# Patient Record
Sex: Female | Born: 1955 | Race: White | Hispanic: No | Marital: Married | Smoking: Former smoker
Health system: Southern US, Community
[De-identification: ages and names within clinical notes are randomized; demographics above are authoritative.]

## PROBLEM LIST (undated history)

## (undated) DIAGNOSIS — C539 Malignant neoplasm of cervix uteri, unspecified: Secondary | ICD-10-CM

## (undated) DIAGNOSIS — C8190 Hodgkin lymphoma, unspecified, unspecified site: Secondary | ICD-10-CM

## (undated) DIAGNOSIS — IMO0002 Reserved for concepts with insufficient information to code with codable children: Secondary | ICD-10-CM

## (undated) DIAGNOSIS — R011 Cardiac murmur, unspecified: Secondary | ICD-10-CM

## (undated) DIAGNOSIS — E119 Type 2 diabetes mellitus without complications: Secondary | ICD-10-CM

## (undated) DIAGNOSIS — E079 Disorder of thyroid, unspecified: Secondary | ICD-10-CM

## (undated) DIAGNOSIS — H9190 Unspecified hearing loss, unspecified ear: Secondary | ICD-10-CM

## (undated) DIAGNOSIS — C569 Malignant neoplasm of unspecified ovary: Secondary | ICD-10-CM

## (undated) DIAGNOSIS — K219 Gastro-esophageal reflux disease without esophagitis: Secondary | ICD-10-CM

## (undated) DIAGNOSIS — L409 Psoriasis, unspecified: Secondary | ICD-10-CM

## (undated) HISTORY — DX: Malignant neoplasm of cervix uteri, unspecified: C53.9

## (undated) HISTORY — DX: Psoriasis, unspecified: L40.9

## (undated) HISTORY — PX: ABDOMINAL HYSTERECTOMY: SHX81

## (undated) HISTORY — DX: Reserved for concepts with insufficient information to code with codable children: IMO0002

## (undated) HISTORY — PX: BOWEL RESECTION: SHX1257

## (undated) HISTORY — DX: Unspecified hearing loss, unspecified ear: H91.90

## (undated) HISTORY — DX: Hodgkin lymphoma, unspecified, unspecified site: C81.90

## (undated) HISTORY — DX: Gastro-esophageal reflux disease without esophagitis: K21.9

## (undated) HISTORY — DX: Cardiac murmur, unspecified: R01.1

## (undated) HISTORY — DX: Disorder of thyroid, unspecified: E07.9

## (undated) HISTORY — DX: Type 2 diabetes mellitus without complications: E11.9

## (undated) HISTORY — PX: SPLENECTOMY: SUR1306

## (undated) HISTORY — PX: BASAL CELL CARCINOMA EXCISION: SHX1214

## (undated) HISTORY — DX: Malignant neoplasm of unspecified ovary: C56.9

---
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
## 1981-01-06 HISTORY — PX: COCHLEAR IMPLANT: SUR684

## 1985-01-06 DIAGNOSIS — C8190 Hodgkin lymphoma, unspecified, unspecified site: Secondary | ICD-10-CM

## 1985-01-06 HISTORY — DX: Hodgkin lymphoma, unspecified, unspecified site: C81.90

## 1997-08-14 ENCOUNTER — Ambulatory Visit (HOSPITAL_COMMUNITY): Admission: RE | Admit: 1997-08-14 | Discharge: 1997-08-14 | Payer: Self-pay | Admitting: Orthopedic Surgery

## 1997-08-21 ENCOUNTER — Ambulatory Visit (HOSPITAL_COMMUNITY): Admission: RE | Admit: 1997-08-21 | Discharge: 1997-08-21 | Payer: Self-pay | Admitting: Gastroenterology

## 1998-08-29 ENCOUNTER — Encounter: Payer: Self-pay | Admitting: Neurology

## 1998-08-29 ENCOUNTER — Ambulatory Visit (HOSPITAL_COMMUNITY): Admission: RE | Admit: 1998-08-29 | Discharge: 1998-08-29 | Payer: Self-pay | Admitting: Neurology

## 1998-11-06 ENCOUNTER — Encounter: Payer: Self-pay | Admitting: Hematology and Oncology

## 1998-11-06 ENCOUNTER — Encounter: Admission: RE | Admit: 1998-11-06 | Discharge: 1998-11-06 | Payer: Self-pay | Admitting: Hematology and Oncology

## 1999-06-10 ENCOUNTER — Encounter: Admission: RE | Admit: 1999-06-10 | Discharge: 1999-06-10 | Payer: Self-pay | Admitting: Neurology

## 1999-06-10 ENCOUNTER — Encounter: Payer: Self-pay | Admitting: Neurology

## 2000-03-24 ENCOUNTER — Encounter: Admission: RE | Admit: 2000-03-24 | Discharge: 2000-03-24 | Payer: Self-pay | Admitting: Family Medicine

## 2000-03-24 ENCOUNTER — Encounter: Payer: Self-pay | Admitting: Family Medicine

## 2001-04-14 ENCOUNTER — Ambulatory Visit (HOSPITAL_COMMUNITY): Admission: RE | Admit: 2001-04-14 | Discharge: 2001-04-14 | Payer: Self-pay | Admitting: Hematology and Oncology

## 2001-04-14 ENCOUNTER — Encounter: Payer: Self-pay | Admitting: Hematology and Oncology

## 2002-03-30 ENCOUNTER — Encounter: Admission: RE | Admit: 2002-03-30 | Discharge: 2002-03-30 | Payer: Self-pay | Admitting: Hematology and Oncology

## 2002-03-30 ENCOUNTER — Encounter: Payer: Self-pay | Admitting: Hematology and Oncology

## 2004-07-18 ENCOUNTER — Encounter: Admission: RE | Admit: 2004-07-18 | Discharge: 2004-07-18 | Payer: Self-pay | Admitting: Hematology and Oncology

## 2004-07-30 ENCOUNTER — Encounter: Admission: RE | Admit: 2004-07-30 | Discharge: 2004-07-30 | Payer: Self-pay | Admitting: Hematology and Oncology

## 2005-07-21 ENCOUNTER — Encounter: Admission: RE | Admit: 2005-07-21 | Discharge: 2005-07-21 | Payer: Self-pay | Admitting: Hematology and Oncology

## 2007-10-18 ENCOUNTER — Ambulatory Visit (HOSPITAL_COMMUNITY): Admission: RE | Admit: 2007-10-18 | Discharge: 2007-10-18 | Payer: Self-pay | Admitting: Gastroenterology

## 2009-12-18 ENCOUNTER — Ambulatory Visit (HOSPITAL_COMMUNITY)
Admission: RE | Admit: 2009-12-18 | Discharge: 2009-12-18 | Payer: Self-pay | Source: Home / Self Care | Attending: Obstetrics and Gynecology | Admitting: Obstetrics and Gynecology

## 2010-03-19 LAB — COMPREHENSIVE METABOLIC PANEL
ALT: 31 U/L (ref 0–35)
AST: 27 U/L (ref 0–37)
Albumin: 3.8 g/dL (ref 3.5–5.2)
Alkaline Phosphatase: 76 U/L (ref 39–117)
BUN: 12 mg/dL (ref 6–23)
CO2: 26 mEq/L (ref 19–32)
Calcium: 9.5 mg/dL (ref 8.4–10.5)
Chloride: 105 mEq/L (ref 96–112)
Creatinine, Ser: 0.68 mg/dL (ref 0.4–1.2)
GFR calc Af Amer: >60 mL/min (ref >60)
GFR calc non Af Amer: >60 mL/min (ref >60)
Glucose, Bld: 171 mg/dL — ABNORMAL HIGH (ref 70–99)
Potassium: 3.9 mEq/L (ref 3.5–5.1)
Sodium: 140 mEq/L (ref 135–145)
Total Bilirubin: 0.5 mg/dL (ref 0.3–1.2)
Total Protein: 7.6 g/dL (ref 6.0–8.3)

## 2010-03-19 LAB — DIFFERENTIAL
Basophils Absolute: 0.1 10*3/uL (ref 0.0–0.1)
Basophils Relative: 1 % (ref 0–1)
Eosinophils Absolute: 0.2 10*3/uL (ref 0.0–0.7)
Eosinophils Relative: 2 % (ref 0–5)
Lymphocytes Relative: 36 % (ref 12–46)
Lymphs Abs: 4 10*3/uL (ref 0.7–4.0)
Monocytes Absolute: 1 10*3/uL (ref 0.1–1.0)
Monocytes Relative: 9 % (ref 3–12)
Neutro Abs: 5.9 10*3/uL (ref 1.7–7.7)
Neutrophils Relative %: 53 % (ref 43–77)

## 2010-03-19 LAB — URINALYSIS, ROUTINE W REFLEX MICROSCOPIC
Bilirubin Urine: NEGATIVE
Glucose, UA: NEGATIVE mg/dL
Ketones, ur: NEGATIVE mg/dL
Leukocytes, UA: NEGATIVE
Nitrite: NEGATIVE
Protein, ur: NEGATIVE mg/dL
Specific Gravity, Urine: 1.025 (ref 1.005–1.030)
Urobilinogen, UA: 0.2 mg/dL (ref 0.0–1.0)
pH: 5.5 (ref 5.0–8.0)

## 2010-03-19 LAB — PROTIME-INR
INR: 0.91 (ref 0.00–1.49)
Prothrombin Time: 12.5 seconds (ref 11.6–15.2)

## 2010-03-19 LAB — CBC
HCT: 43.6 % (ref 36.0–46.0)
Hemoglobin: 14.6 g/dL (ref 12.0–15.0)
MCH: 31.8 pg (ref 26.0–34.0)
MCHC: 33.5 g/dL (ref 30.0–36.0)
MCV: 94.9 fL (ref 78.0–100.0)
Platelets: 422 10*3/uL — ABNORMAL HIGH (ref 150–400)
RBC: 4.59 MIL/uL (ref 3.87–5.11)
RDW: 13.1 % (ref 11.5–15.5)
WBC: 11.1 10*3/uL — ABNORMAL HIGH (ref 4.0–10.5)

## 2010-03-19 LAB — URINE MICROSCOPIC-ADD ON

## 2010-03-19 LAB — APTT: aPTT: 27 seconds (ref 24–37)

## 2010-05-21 NOTE — Op Note (Signed)
NAMEMIRZA, HRON               ACCOUNT NO.:  [id number]   MEDICAL RECORD NO.:  [id number]          PATIENT TYPE:  AMB   LOCATION:  ENDO                         FACILITY:  [facility]   PHYSICIAN:  Ronald Lobo, M.D.   DATE OF BIRTH:  [birth year]   DATE OF PROCEDURE:  10/18/2007  DATE OF DISCHARGE:                               OPERATIVE REPORT   PROCEDURE:  Colonoscopy.   INDICATIONS:  Initial colon cancer screening exam in a 55 year old  female without worrisome risk factors.   FINDINGS:  Normal exam.   PROCEDURE:  The procedure had been discussed with the patient through  our open access program and she provided written consent.  I reviewed  the risks with her prior to the start of the procedure.  Sedation was  fentanyl 65 mcg and Versed 10 mg IV without arrhythmias or desaturation  during the course of the procedure.  The Pentax adult video colonoscope  was advanced without difficulty to the cecum and for short distance into  a normal-appearing terminal ileum (a little bit of focal erythema right  at the ileocecal valve).  Pullback was then performed.  The quality of  prep was excellent and felt that all areas were well seen.   This was a normal examination.  No polyps, cancer, colitis, vascular  malformations, or diverticulosis were noted and retroflexion in the  rectum and reinspection of the rectum were also normal.  No biopsies  were obtained.  The patient tolerated the procedure well and there were  no apparent complications.   IMPRESSION:  Normal screening colonoscopy.   PLAN:  Followup colonoscopy in 10 years for routine screening.           ______________________________  Ronald Lobo, M.D.     RB/MEDQ  D:  10/18/2007  T:  10/19/2007  Job:  [id number]   cc:   Danae Orleans. Edmonia James, NP

## 2012-07-22 ENCOUNTER — Encounter: Payer: Self-pay | Admitting: Obstetrics and Gynecology

## 2012-07-22 ENCOUNTER — Ambulatory Visit (INDEPENDENT_AMBULATORY_CARE_PROVIDER_SITE_OTHER): Payer: Commercial Managed Care - PPO | Admitting: Obstetrics and Gynecology

## 2012-07-22 VITALS — BP 134/76 | HR 80 | Ht 66.0 in | Wt 186.0 lb

## 2012-07-22 DIAGNOSIS — Z0000 Encounter for general adult medical examination without abnormal findings: Secondary | ICD-10-CM

## 2012-07-22 DIAGNOSIS — N9089 Other specified noninflammatory disorders of vulva and perineum: Secondary | ICD-10-CM

## 2012-07-22 DIAGNOSIS — Z01419 Encounter for gynecological examination (general) (routine) without abnormal findings: Secondary | ICD-10-CM

## 2012-07-22 DIAGNOSIS — Z23 Encounter for immunization: Secondary | ICD-10-CM

## 2012-07-22 LAB — POCT URINALYSIS DIPSTICK
Bilirubin, UA: NEGATIVE
Blood, UA: NEGATIVE
Ketones, UA: NEGATIVE
Leukocytes, UA: NEGATIVE
Nitrite, UA: NEGATIVE
Protein, UA: NEGATIVE
Urobilinogen, UA: NEGATIVE
pH, UA: 5

## 2012-07-22 NOTE — Progress Notes (Signed)
Patient ID: Jeanne Sims, female   DOB: 06/11/1955, 57 y.o.   MRN: [id number] 57 y.o.   Married    Caucasian   female   No obstetric history on file.   here for annual exam.    States history of cervical and ovarian cancer.  Had hysterectomy at age 57 years old.   Sometimes has urinary incontinence.  Patient has a history of bladder tack 1 1/2 - 2 years.  Can leak with sneeze or a hard cough - not a lot.   Thinks she may have had a sling.  Doing own kegel exercises which is making the leakage better.   No pad use.   No urgency leakage.  No leak for no reason at all.   When wipes, can feel some bladder soreness.    PCP removed a skin nevus on her vulva and discarded it without pathological diagnosis. Patient states it has recurred and wishes re-evaluation.  Constant constipation.  Uses Miralax.  It is a constant effort to know how much or how little to use.   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  No LMP recorded. Patient has had a hysterectomy.          Sexually active: yes  The current method of family planning is status post hysterectomy.    Exercising: walking, squats and crunches Last mammogram:  01/2012 wnl in [place] Last pap smear: 18 months to 2 years ago:[facility] History of abnormal pap: HX cervical cancer age 26: had hysterectomy Smoking: no Alcohol: rarely Last colonoscopy: 2012 wnl: Dr. Cristina Gong with [facility]: next colonoscopy due 2017 Last Bone Density:  5 -  6  years ago Last tetanus shot: 10 years ago or more Last cholesterol check:  3 months ago:[facility] - Dr Jannette Fogo, [place] [place], [place]  Hgb:     PVP           Urine:  Positive for glucose   Family History  Problem Relation Age of Onset  . Heart failure Mother   . Diabetes Mother   . Diabetes Brother   . Breast cancer Paternal Uncle   . Diabetes Sister   . Cervical cancer Sister   . Diabetes Sister   . Cervical cancer Sister   . Diabetes Sister   .  Diabetes Brother     There are no active problems to display for this patient.   Past Medical History  Diagnosis Date  . Hodgkin disease 87  . Cervical cancer [age]  . Diabetes mellitus without complication   . Dyspareunia   . Heart murmur   . Thyroid disease   . Acquired deafness   . Cervical cancer   . Ovarian cancer     age 13    Past Surgical History  Procedure Laterality Date  . 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 [facility] Mule  . Abdominal hysterectomy      TAH/BSO at age 28 d/t cervical CA  . Basal cell carcinoma excision      face  . Splenectomy      Allergies: Review of patient's allergies indicates no known allergies.  Current Outpatient Prescriptions  Medication Sig Dispense Refill  . levothyroxine (SYNTHROID, LEVOTHROID) 200 MCG tablet Take 200 mcg by mouth daily before breakfast.      . metFORMIN (GLUCOPHAGE) 500 MG tablet Take 500 mg by mouth 2 (two) times daily with a meal.      . methotrexate (RHEUMATREX) 5 MG  tablet Take 5 mg by mouth once a week. Caution: Chemotherapy. Protect from light.Takes 3 tablets in AM and 3 tablets in PM once weekly.      [name] omeprazole (PRILOSEC) 20 MG capsule Take 20 mg by mouth 2 (two) times daily.      . rosuvastatin (CRESTOR) 10 MG tablet Take 10 mg by mouth daily.       No current facility-administered medications for this visit.    ROS: Pertinent items are noted in HPI.  Social Hx:  Occupational hygienist.    Exam:    BP 134/76  Pulse 80  Ht 5\' 6"  (1.676 m)  Wt 186 lb (84.369 kg)  BMI 30.04 kg/m2   Wt Readings from Last 3 Encounters:  07/22/12 186 lb (84.369 kg)     Ht Readings from Last 3 Encounters:  07/22/12 5\' 6"  (1.676 m)    General appearance: alert, cooperative and appears stated age Head: Normocephalic, without obvious abnormality, atraumatic Neck: no adenopathy, supple, symmetrical, trachea midline and thyroid not enlarged, symmetric, no tenderness/mass/nodules Lungs: clear to auscultation bilaterally Breasts:  Inspection negative, No nipple retraction or dimpling, No nipple discharge or bleeding, No axillary or supraclavicular adenopathy, Normal to palpation without dominant masses Heart: regular rate and rhythm Abdomen: vertical midline abdominal incision, soft, non-tender; bowel sounds normal; no masses,  no organomegaly Extremities: extremities normal, atraumatic, no cyanosis or edema Skin: Skin color, texture, turgor normal. No rashes or lesions.  Multiple areas of hypo and hyperpigmentation. Lymph nodes: Cervical, supraclavicular, and axillary nodes normal. No abnormal inguinal nodes palpated Neurologic: Grossly normal   Pelvic: External genitalia:  no lesions.  3 - 4 mm raised nonpigmented nevus of the left labia majora inferiorly.              Urethra:  normal appearing urethra with no masses, tenderness or lesions              Bartholins and Skenes: normal                 Vagina: normal appearing vagina with normal color and discharge, no lesions              Cervix:  absent              Pap taken: yes        Bimanual Exam:  Uterus:   absent                                      Adnexa:  no masses                                      Rectovaginal: Confirms                                      Anus:  normal sphincter tone, no lesions  A: normal menopausal exam History of ovarian and cervical cancer per patient. Status post probable midurethral sling.  Current mild stress incontinence.  Declines treatment. Overdue for tetanus vaccine Vulvar nevus, recurrent. History of basal cell CA.     P: mammogram in 6 months. Patient will get a copy of her prior mammogram for [place]. Get records from prior GYN office Tdap today pap smear and  high risk HPV testing. Return for vulvar biopsy. return annually or prn     An After Visit Summary was printed and given to the patient.

## 2012-07-22 NOTE — Patient Instructions (Addendum)
EXERCISE AND DIET:  We recommended that you start or continue a regular exercise program for good health. Regular exercise means any activity that makes your heart beat faster and makes you sweat.  We recommend exercising at least 30 minutes per day at least 3 days a week, preferably 4 or 5.  We also recommend a diet low in fat and sugar.  Inactivity, poor dietary choices and obesity can cause diabetes, heart attack, stroke, and kidney damage, among others.    ALCOHOL AND SMOKING:  Women should limit their alcohol intake to no more than 7 drinks/beers/glasses of wine (combined, not each!) per week. Moderation of alcohol intake to this level decreases your risk of breast cancer and liver damage. And of course, no recreational drugs are part of a healthy lifestyle.  And absolutely no smoking or even second hand smoke. Most people know smoking can cause heart and lung diseases, but did you know it also contributes to weakening of your bones? Aging of your skin?  Yellowing of your teeth and nails?  CALCIUM AND VITAMIN D:  Adequate intake of calcium and Vitamin D are recommended.  The recommendations for exact amounts of these supplements seem to change often, but generally speaking 600 mg of calcium (either carbonate or citrate) and 800 units of Vitamin D per day seems prudent. Certain women may benefit from higher intake of Vitamin D.  If you are among these women, your doctor will have told you during your visit.    PAP SMEARS:  Pap smears, to check for cervical cancer or precancers,  have traditionally been done yearly, although recent scientific advances have shown that most women can have pap smears less often.  However, every woman still should have a physical exam from her gynecologist every year. It will include a breast check, inspection of the vulva and vagina to check for abnormal growths or skin changes, a visual exam of the cervix, and then an exam to evaluate the size and shape of the uterus and  ovaries.  And after 57 years of age, a rectal exam is indicated to check for rectal cancers. We will also provide age appropriate advice regarding health maintenance, like when you should have certain vaccines, screening for sexually transmitted diseases, bone density testing, colonoscopy, mammograms, etc.   MAMMOGRAMS:  All women over 40 years old should have a yearly mammogram. Many facilities now offer a "3D" mammogram, which may cost around $50 extra out of pocket. If possible,  we recommend you accept the option to have the 3D mammogram performed.  It both reduces the number of women who will be called back for extra views which then turn out to be normal, and it is better than the routine mammogram at detecting truly abnormal areas.    COLONOSCOPY:  Colonoscopy to screen for colon cancer is recommended for all women at age 50.  We know, you hate the idea of the prep.  We agree, BUT, having colon cancer and not knowing it is worse!!  Colon cancer so often starts as a polyp that can be seen and removed at colonscopy, which can quite literally save your life!  And if your first colonoscopy is normal and you have no family history of colon cancer, most women don't have to have it again for 10 years.  Once every ten years, you can do something that may end up saving your life, right?  We will be happy to help you get it scheduled when you are ready.    Be sure to check your insurance coverage so you understand how much it will cost.  It may be covered as a preventative service at no cost, but you should check your particular policy.    Tetanus, Diphtheria, Pertussis (Tdap) Vaccine What You Need to Know WHY GET VACCINATED? Tetanus, diphtheria and pertussis can be very serious diseases, even for adolescents and adults. Tdap vaccine can protect [place] from these diseases. TETANUS (Lockjaw) causes painful muscle tightening and stiffness, usually all over the body.  It can lead to tightening of muscles in the head  and neck so you can't open your mouth, swallow, or sometimes even breathe. Tetanus kills about 1 out of 5 people who are infected. DIPHTHERIA can cause a thick coating to form in the back of the throat.  It can lead to breathing problems, paralysis, heart failure, and death. PERTUSSIS (Whooping Cough) causes severe coughing spells, which can cause difficulty breathing, vomiting and disturbed sleep.  It can also lead to weight loss, incontinence, and rib fractures. Up to 2 in 100 adolescents and 5 in 100 adults with pertussis are hospitalized or have complications, which could include pneumonia and death. These diseases are caused by bacteria. Diphtheria and pertussis are spread from person to person through coughing or sneezing. Tetanus enters the body through cuts, scratches, or wounds. Before vaccines, the [place] saw as many as 200,000 cases a year of diphtheria and pertussis, and hundreds of cases of tetanus. Since vaccination began, tetanus and diphtheria have dropped by about 99% and pertussis by about 80%. TDAP VACCINE Tdap vaccine can protect adolescents and adults from tetanus, diphtheria, and pertussis. One dose of Tdap is routinely given at age 46 or 64. People who did not get Tdap at that age should get it as soon as possible. Tdap is especially important for health care professionals and anyone having close contact with a baby younger than 12 months. Pregnant women should get a dose of Tdap during every pregnancy, to protect the newborn from pertussis. Infants are most at risk for severe, life-threatening complications from pertussis. A similar vaccine, called Td, protects from tetanus and diphtheria, but not pertussis. A Td booster should be given every 10 years. Tdap may be given as one of these boosters if you have not already gotten a dose. Tdap may also be given after a severe cut or burn to prevent tetanus infection. Your doctor can give you more information. Tdap may safely  be given at the same time as other vaccines. SOME PEOPLE SHOULD NOT GET THIS VACCINE  If you ever had a life-threatening allergic reaction after a dose of any tetanus, diphtheria, or pertussis containing vaccine, OR if you have a severe allergy to any part of this vaccine, you should not get Tdap. Tell your doctor if you have any severe allergies.  If you had a coma, or long or multiple seizures within 7 days after a childhood dose of DTP or DTaP, you should not get Tdap, unless a cause other than the vaccine was found. You can still get Td.  Talk to your doctor if you:  have epilepsy or another nervous system problem,  had severe pain or swelling after any vaccine containing diphtheria, tetanus or pertussis,  ever had Guillain-Barr Syndrome (GBS),  aren't feeling well on the day the shot is scheduled. RISKS OF A VACCINE REACTION With any medicine, including vaccines, there is a chance of side effects. These are usually mild and go away on their own, but serious  reactions are also possible. Brief fainting spells can follow a vaccination, leading to injuries from falling. Sitting or lying down for about 15 minutes can help prevent these. Tell your doctor if you feel dizzy or light-headed, or have vision changes or ringing in the ears. Mild problems following Tdap (Did not interfere with activities)  Pain where the shot was given (about 3 in 4 adolescents or 2 in 3 adults)  Redness or swelling where the shot was given (about 1 person in 5)  Mild fever of at least 100.8F (up to about 1 in 25 adolescents or 1 in 100 adults)  Headache (about 3 or 4 people in 10)  Tiredness (about 1 person in 3 or 4)  Nausea, vomiting, diarrhea, stomach ache (up to 1 in 4 adolescents or 1 in 10 adults)  Chills, body aches, sore joints, rash, swollen glands (uncommon) Moderate problems following Tdap (Interfered with activities, but did not require medical attention)  Pain where the shot was given  (about 1 in 5 adolescents or 1 in 100 adults)  Redness or swelling where the shot was given (up to about 1 in 16 adolescents or 1 in 25 adults)  Fever over 102F (about 1 in 100 adolescents or 1 in 250 adults)  Headache (about 3 in 20 adolescents or 1 in 10 adults)  Nausea, vomiting, diarrhea, stomach ache (up to 1 or 3 people in 100)  Swelling of the entire arm where the shot was given (up to about 3 in 100). Severe problems following Tdap (Unable to perform usual activities, required medical attention)  Swelling, severe pain, bleeding and redness in the arm where the shot was given (rare). A severe allergic reaction could occur after any vaccine (estimated less than 1 in a million doses). WHAT IF THERE IS A SERIOUS REACTION? What should I look for?  Look for anything that concerns you, such as signs of a severe allergic reaction, very high fever, or behavior changes. Signs of a severe allergic reaction can include hives, swelling of the face and throat, difficulty breathing, a fast heartbeat, dizziness, and weakness. These would start a few minutes to a few hours after the vaccination. What should I do?  If you think it is a severe allergic reaction or other emergency that can't wait, call 9-1-1 or get the person to the nearest hospital. Otherwise, call your doctor.  Afterward, the reaction should be reported to the "Vaccine Adverse Event Reporting System" (VAERS). Your doctor might file this report, or you can do it yourself through the VAERS web site at www.vaers.SamedayNews.es, or by calling 910-124-1173. VAERS is only for reporting reactions. They do not give medical advice.  THE NATIONAL VACCINE INJURY COMPENSATION PROGRAM The National Vaccine Injury Compensation Program (VICP) is a federal program that was created to compensate people who may have been injured by certain vaccines. Persons who believe they may have been injured by a vaccine can learn about the program and about filing a  claim by calling 440-671-1827 or visiting the [place] website at GoldCloset.com.ee. HOW CAN I LEARN MORE?  Ask your doctor.  Call your local or state health department.  Contact the Centers for Disease Control and Prevention (CDC):  Call (579)183-4004 or visit CDC's website at http://hunter.com/. CDC Tdap Vaccine VIS (05/15/11) Document Released: 06/24/2011 Document Revised: 09/17/2011 Document Reviewed: 06/24/2011 ExitCare Patient Information 2014 [place].

## 2012-07-26 LAB — IPS PAP TEST WITH HPV

## 2012-08-02 ENCOUNTER — Telehealth: Payer: Self-pay | Admitting: Obstetrics and Gynecology

## 2012-08-13 ENCOUNTER — Ambulatory Visit (INDEPENDENT_AMBULATORY_CARE_PROVIDER_SITE_OTHER): Payer: Commercial Managed Care - PPO | Admitting: Obstetrics and Gynecology

## 2012-08-13 ENCOUNTER — Encounter: Payer: Self-pay | Admitting: Obstetrics and Gynecology

## 2012-08-13 VITALS — BP 120/70 | HR 80 | Ht 66.0 in | Wt 185.0 lb

## 2012-08-13 DIAGNOSIS — N9089 Other specified noninflammatory disorders of vulva and perineum: Secondary | ICD-10-CM

## 2012-08-13 NOTE — Progress Notes (Signed)
Subjective:     Patient ID: Jeanne Sims, female   DOB: Apr 07, 1955, 57 y.o.   MRN: [id number]  HPI Patient is here for vulvar biopsy. Had a lesion of the vulva which was biopsied and discarded by the patient's PCP.   No tissue diagnosis. Patient states the area has returned and the patient is worried about cancer.   Review of Systems  See HPI.    Objective:   Physical Exam  Genitourinary:       See diagram.  Procedure - vulvar biopsy  Consent performed.  Sterile prep with Hibiclens.  Local 1% lidocaine plain - 5 cc injected locally. Vertical elliptical incision to remove entire lesion area performed with a scalpel. Tissue to pathology. Interrupted sutures of 3/0 Vicryl - 3 sutures. Minimal EBL. Procedure tolerated well. No complications.    Assessment:     Vulvar lesions.    Plan:     Follow up on biopsy. Return in 2 weeks for suture removal.

## 2012-08-13 NOTE — Patient Instructions (Signed)
Cal if you have redness and increasing pain in the biopsy site.  Clean with warm water and antibacterial soap, carefully.  Return in two weeks for suture removal.

## 2012-08-20 ENCOUNTER — Telehealth: Payer: Self-pay

## 2012-08-20 LAB — IPS OTHER TISSUE BIOPSY

## 2012-08-20 NOTE — Telephone Encounter (Signed)
Notified patient vulvar biopsy from 08-13-12 benign.

## 2012-08-27 ENCOUNTER — Encounter: Payer: Self-pay | Admitting: Obstetrics and Gynecology

## 2012-08-27 ENCOUNTER — Ambulatory Visit (INDEPENDENT_AMBULATORY_CARE_PROVIDER_SITE_OTHER): Payer: Commercial Managed Care - PPO | Admitting: Obstetrics and Gynecology

## 2012-08-27 VITALS — BP 110/70 | HR 80 | Ht 66.0 in | Wt 185.0 lb

## 2012-08-27 DIAGNOSIS — N951 Menopausal and female climacteric states: Secondary | ICD-10-CM

## 2012-08-27 NOTE — Patient Instructions (Signed)
Menopause and Herbal Products Menopause is the normal time of life when menstrual periods stop completely. Menopause is complete when you have missed 12 consecutive menstrual periods. It usually occurs between the ages of 48 to 55, with an average age of 51. Very rarely does a woman develop menopause before 57 years old. At menopause, your ovaries stop producing the female hormones, estrogen and progesterone. This can cause undesirable symptoms and also affect your health. Sometimes the symptoms can occur 4 to 5 years before the menopause begins. There is no relationship between menopause and:  Oral contraceptives.  Number of children you had.  Race.  The age your menstrual periods started (menarche). Heavy smokers and very thin women may develop menopause earlier in life. Estrogen and progesterone hormone treatment is the usual method of treating menopausal symptoms. However, there are women who should not take hormone treatment. This is true of:   Women that have breast or uterine cancer.  Women who prefer not to take hormones because of certain side effects (abnormal uterine bleeding).  Women who are afraid that hormones may cause breast cancer.  Women who have a history of liver disease, heart disease, stroke, or blood clots. For these women, there are other medications that may help treat their menopausal symptoms. These medications are found in plants and botanical products. They can be found in the form of herbs, teas, oils, tinctures, and pills.  CAUSES:  The ovaries stop producing the female hormones estrogen and progesterone.  Other causes include:  Surgery to remove both ovaries.  The ovaries stop functioning for no know reason.  Tumors of the pituitary gland in the brain.  Medical disease that affects the ovaries and hormone production.  Radiation treatment to the abdomen or pelvis.  Chemotherapy that affects the ovaries. PHYTOESTROGENS: Phytoestrogens occur  naturally in plants and plant products. They act like estrogen in the body. Herbal medications are made from these plants and botanical steroids. There are 3 types of phytoestrogens:  Isoflavones (genistein and daidzein) are found in soy, garbanzo beans, miso and tofu foods.  Ligins are found in the shell of seeds. They are used to make oils like flaxseed oil. The bacteria in your intestine act on these foods to produce the estrogen-like hormones.  Coumestans are estrogen-like. Some of the foods they are found in include sunflower seeds and bean sprouts. CONDITIONS AND THEIR POSSIBLE HERBAL TREATMENT:  Hot flashes and night sweats.  Soy, black cohosh and evening primrose.  Irritability, insomnia, depression and memory problems.  Chasteberry, ginseng, and soy.  St. John's wort may be helpful for depression. However, there is a concern of it causing cataracts of the eye and may have bad effects on other medications. St. John's wort should not be taken for long time and without your caregiver's advice.  Loss of libido and vaginal and skin dryness.  Wild yam and soy.  Prevention of coronary heart disease and osteoporosis.  Soy and Isoflavones. Several studies have shown that some women benefit from herbal medications, but most of the studies have not consistently shown that these supplements are much better than placebo. Other forms of treatment to help women with menopausal symptoms include a balanced diet, rest, exercise, vitamin and calcium (with vitamin D) supplements, acupuncture, and group therapy when necessary. THOSE WHO SHOULD NOT TAKE HERBAL MEDICATIONS INCLUDE:  Women who are planning on getting pregnant unless told by your caregiver.  Women who are breastfeeding unless told by your caregiver.  Women who are taking other   prescription medications unless told by your caregiver.  Infants, children, and elderly women unless told by your caregiver. Different herbal medications  have different and unmeasured amounts of the herbal ingredients. There are no regulations, quality control, and standardization of the ingredients in herbal medications. Therefore, the amount of the ingredient in the medication may vary from one herb, pill, tea, oil or tincture to another. Many herbal medications can cause serious problems and can even have poisonous effects if taken too much or too long. If problems develop, the medication should be stopped and recorded by your caregiver. HOME CARE INSTRUCTIONS  Do not take or give children herbal medications without your caregiver's advice.  Let your caregiver know all the medications you are taking. This includes prescription, over-the-counter, eye drops, and creams.  Do not take herbal medications longer or more than recommended.  Tell your caregiver about any side effects from the medication. SEEK MEDICAL CARE IF:  You develop a fever of 102 F (38.9 C), or as directed by your caregiver.  You feel sick to your stomach (nauseous), vomit, or have diarrhea.  You develop a rash.  You develop abdominal pain.  You develop severe headaches.  You start to have vision problems.  You feel dizzy or faint.  You start to feel numbness in any part of your body.  You start shaking (have convulsions). Document Released: 06/11/2007 Document Revised: 12/10/2011 Document Reviewed: 01/08/2010 [facility] Patient Information 2014 [place].  Paroxetine tablets What is this medicine? PAROXETINE (pa ROX e teen) is used to treat depression. It may also be used to treat anxiety disorders, obsessive compulsive disorder, panic attacks, post traumatic stress, and premenstrual dysphoric disorder (PMDD). This medicine may be used for other purposes; ask your health care provider or pharmacist if you have questions. What should I tell my health care provider before I take this medicine? They need to know if you have any of these conditions: -bipolar  disorder or a family history of bipolar disorder -heart disease -kidney or liver disease -receiving electroconvulsive therapy -seizures (convulsions) -suicidal thoughts or a previous suicide attempt -an unusual or allergic reaction to paroxetine, other medicines, foods, dyes, or preservatives -pregnant or trying to get pregnant -breast-feeding How should I use this medicine? Take this medicine by mouth with a glass of water. Follow the directions on the prescription label. You can take it with or without food. Take your medicine at regular intervals. Do not take your medicine more often than directed. Do not stop taking this medicine suddenly except upon the advice of your doctor. Stopping this medicine too quickly may cause serious side effects or your condition may worsen. A special MedGuide will be given to you by the pharmacist with each prescription and refill. Be sure to read this information carefully each time. Talk to your pediatrician regarding the use of this medicine in children. Special care may be needed. Overdosage: If you think you have taken too much of this medicine contact a poison control center or emergency room at once. NOTE: This medicine is only for you. Do not share this medicine with others. What if I miss a dose? If you miss a dose, take it as soon as you can. If it is almost time for your next dose, take only that dose. Do not take double or extra doses. What may interact with this medicine? Do not take this medicine with any of the following medications: -linezolid -MAOIs like Carbex, Eldepryl, Marplan, Nardil, and Parnate -methylene blue (injected into  a vein) -pimozide -thioridazine This medicine may also interact with the following medications: -alcohol -aspirin and aspirin-like medicines -atomoxetine -certain medicines for depression, anxiety, or psychotic disturbances -certain medicines for irregular heart beat like propafenone, flecainide, encainide,  and quinidine -certain medicines for migraine headache like almotriptan, eletriptan, frovatriptan, naratriptan, rizatriptan, sumatriptan, zolmitriptan -cimetidine -digoxin -diuretics -fentanyl -fosamprenavir/ritonavir -furazolidone -isoniazid -lithium -medicines that treat or prevent blood clots like warfarin, enoxaparin, and dalteparin -medicines for sleep -metoprolol -NSAIDs, medicines for pain and inflammation, like ibuprofen or naproxen -phenobarbital -phenytoin -procarbazine -procyclidine -rasagiline -supplements like St. John's wort, kava kava, valerian -tamoxifen -theophylline -tramadol -tryptophan This list may not describe all possible interactions. Give your health care provider a list of all the medicines, herbs, non-prescription drugs, or dietary supplements you use. Also tell them if you smoke, drink alcohol, or use illegal drugs. Some items may interact with your medicine. What should I watch for while using this medicine? Tell your doctor if your symptoms do not get better or if they get worse. Visit your doctor or health care professional for regular checks on your progress. Because it may take several weeks to see the full effects of this medicine, it is important to continue your treatment as prescribed by your doctor. Patients and their families should watch out for new or worsening thoughts of suicide or depression. Also watch out for sudden changes in feelings such as feeling anxious, agitated, panicky, irritable, hostile, aggressive, impulsive, severely restless, overly excited and hyperactive, or not being able to sleep. If this happens, especially at the beginning of treatment or after a change in dose, call your health care professional. Dennis Bast may get drowsy or dizzy. Do not drive, use machinery, or do anything that needs mental alertness until you know how this medicine affects you. Do not stand or sit up quickly, especially if you are an older patient. This  reduces the risk of dizzy or fainting spells. Alcohol may interfere with the effect of this medicine. Avoid alcoholic drinks. Your mouth may get dry. Chewing sugarless gum or sucking hard candy, and drinking plenty of water will help. Contact your doctor if the problem does not go away or is severe. What side effects may I notice from receiving this medicine? Side effects that you should report to your doctor or health care professional as soon as possible: -allergic reactions like skin rash, itching or hives, swelling of the face, lips, or tongue -black or bloody stools, blood in the urine or vomit -fast, irregular heartbeat -hallucination, loss of contact with reality -painful or prolonged erection (men) -seizures -suicidal thoughts or other mood changes -trouble passing urine or change in the amount of urine -unusual bleeding or bruising -unusually weak or tired -vomiting Side effects that usually do not require medical attention (report to your doctor or health care professional if they continue or are bothersome): -change in appetite, weight -change in sex drive or performance -constipation or diarrhea -difficulty sleeping -drowsy -headache -increased sweating -muscle pain or weakness -tremors This list may not describe all possible side effects. Call your doctor for medical advice about side effects. You may report side effects to FDA at 1-800-FDA-1088. Where should I keep my medicine? Keep out of the reach of children. Store at room temperature between 15 and 30 degrees C (59 and 86 degrees F). Keep container tightly closed. Throw away any unused medicine after the expiration date. NOTE: This sheet is a summary. It may not cover all possible information. If you have questions about this medicine,  talk to your doctor, pharmacist, or health care provider.  2013, [employer]. (05/09/2011 8:57:16 PM)   Venlafaxine extended-release capsules What is this  medicine? VENLAFAXINE (VEN la fax een) is used to treat depression, anxiety and panic disorder. This medicine may be used for other purposes; ask your health care provider or pharmacist if you have questions. What should I tell my health care provider before I take this medicine? They need to know if you have any of these conditions: -anorexia or weight loss -glaucoma -high blood pressure, heart problems or a recent heart attack -high cholesterol levels or receiving treatment for high cholesterol -kidney or liver disease -mania or bipolar disorder -seizures (convulsions) -suicidal thoughts or a previous suicide attempt -thyroid problems -an unusual or allergic reaction to venlafaxine, other medicines, foods, dyes, or preservatives -pregnant or trying to get pregnant -breast-feeding How should I use this medicine? Take this medicine by mouth with a full glass of water. Follow the directions on the prescription label. Do not cut, crush or chew this medicine. Take it with food. Try to take your medicine at about the same time each day. Do not take your medicine more often than directed. Do not stop taking this medicine suddenly except upon the advice of your doctor. Stopping this medicine too quickly may cause serious side effects or your condition may worsen. A special MedGuide will be given to you by the pharmacist with each prescription and refill. Be sure to read this information carefully each time. Talk to your pediatrician regarding the use of this medicine in children. Special care may be needed. Overdosage: If you think you have taken too much of this medicine contact a poison control center or emergency room at once. NOTE: This medicine is only for you. Do not share this medicine with others. What if I miss a dose? If you miss a dose, take it as soon as you can. If it is almost time for your next dose, take only that dose. Do not take double or extra doses. What may interact with this  medicine? Do not take this medicine with any of the following medications: -desvenlafaxine -duloxetine -linezolid -MAOIs like Azilect, Carbex, Eldepryl, Marplan, Nardil, and Parnate -medicines for weight control or appetite -methylene blue (injected into a vein) -nefazodone -procarbazine -tryptophan This medicine may also interact with the following medications: -amphetamine or dextroamphetamine -aspirin and aspirin-like medicines -cimetidine -clozapine -medicines for heart rhythm or blood pressure -medicines for migraine headache like almotriptan, eletriptan, frovatriptan, naratriptan, rizatriptan, sumatriptan, zolmitriptan -medicines that treat or prevent blood clots like warfarin, enoxaparin, and dalteparin -NSAIDS, medicines for pain and inflammation, like ibuprofen or naproxen -other medicines for depression, anxiety, or psychotic disturbances -ritonavir -St. John's wort -tramadol This list may not describe all possible interactions. Give your health care provider a list of all the medicines, herbs, non-prescription drugs, or dietary supplements you use. Also tell them if you smoke, drink alcohol, or use illegal drugs. Some items may interact with your medicine. What should I watch for while using this medicine? Tell your doctor if your symptoms do not get better or if they get worse. Visit your doctor or health care professional for regular checks on your progress. Because it may take several weeks to see the full effects of this medicine, it is important to continue your treatment as prescribed by your doctor. Patients and their families should watch out for new or worsening thoughts of suicide or depression. Also watch out for sudden changes in feelings such as  feeling anxious, agitated, panicky, irritable, hostile, aggressive, impulsive, severely restless, overly excited and hyperactive, or not being able to sleep. If this happens, especially at the beginning of treatment or after  a change in dose, call your health care professional. This medicine can cause an increase in blood pressure. Check with your doctor for instructions on monitoring your blood pressure while taking this medicine. You may get drowsy or dizzy. Do not drive, use machinery, or do anything that needs mental alertness until you know how this medicine affects you. Do not stand or sit up quickly, especially if you are an older patient. This reduces the risk of dizzy or fainting spells. Alcohol may interfere with the effect of this medicine. Avoid alcoholic drinks. Your mouth may get dry. Chewing sugarless gum, sucking hard candy and drinking plenty of water will help. Contact your doctor if the problem does not go away or is severe. What side effects may I notice from receiving this medicine? Side effects that you should report to your doctor or health care professional as soon as possible: -allergic reactions like skin rash, itching or hives, swelling of the face, lips, or tongue -breathing problems -changes in vision -hallucination, loss of contact with reality -seizures -suicidal thoughts or other mood changes -trouble passing urine or change in the amount of urine -unusual bleeding or bruising Side effects that usually do not require medical attention (report to your doctor or health care professional if they continue or are bothersome): -change in sex drive or performance -constipation -increased sweating -loss of appetite -nausea -tremors -weight loss This list may not describe all possible side effects. Call your doctor for medical advice about side effects. You may report side effects to FDA at 1-800-FDA-1088. Where should I keep my medicine? Keep out of the reach of children. Store at a controlled temperature between 20 and 25 degrees C (68 degrees and 77 degrees F), in a dry place. Throw away any unused medicine after the expiration date. NOTE: This sheet is a summary. It may not cover all  possible information. If you have questions about this medicine, talk to your doctor, pharmacist, or health care provider.  2013, [employer] Standard. (05/09/2011 9:05:04 PM)

## 2012-08-27 NOTE — Progress Notes (Signed)
Patient ID: Jeanne Sims, female   DOB: 1955-11-17, 57 y.o.   MRN: [id number]   Subjective  Patient here for suture removal.  Having hot flashes.   Status post TAH/BSO for cervical and ovarian cancer - age 50. Took ERT but stopped 15 - 20 years ago. Asking about alternative to treat symptoms. Jeanne Sims has not been very successful in the past.   Patient takes synthroid for hypothyroidism - managed by Dr. Milly Sims. States her levels are being kept at the high side of normal to maintain her energy level in a good range.   Objective  Vulva - sutures remain intact.  Skin without any generalized erythema.  Sutures removed without difficulty.  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 Menopausal symptoms.  Plan  Reviewed herbal options, including soy containing foods. I also discussed  SSRIs and SNRIs such as Paxil, Zoloft, and Effexor as possible approaches to treatment of menopausal hot flashes. We discusses potential side effects of these medications. Patient will consider.

## 2012-11-11 ENCOUNTER — Other Ambulatory Visit: Payer: Self-pay

## 2012-12-08 ENCOUNTER — Telehealth: Payer: Self-pay | Admitting: Obstetrics and Gynecology

## 2012-12-08 NOTE — Telephone Encounter (Signed)
Phone call with patient.  Billing issues being resolved.

## 2012-12-08 NOTE — Telephone Encounter (Signed)
I returned a call to the patient regarding a letter she sent asking questions about her bill.

## 2012-12-08 NOTE — Telephone Encounter (Signed)
Patient returning Dr. Elza Rafter call.

## 2013-03-28 ENCOUNTER — Encounter: Payer: Self-pay | Admitting: Obstetrics and Gynecology

## 2013-04-18 ENCOUNTER — Encounter: Payer: Self-pay | Admitting: Obstetrics and Gynecology

## 2013-08-18 ENCOUNTER — Ambulatory Visit: Payer: Commercial Managed Care - PPO | Admitting: Obstetrics and Gynecology

## 2014-11-23 ENCOUNTER — Other Ambulatory Visit: Payer: Self-pay | Admitting: Gastroenterology

## 2014-11-23 DIAGNOSIS — R1310 Dysphagia, unspecified: Secondary | ICD-10-CM

## 2014-11-27 ENCOUNTER — Ambulatory Visit
Admission: RE | Admit: 2014-11-27 | Discharge: 2014-11-27 | Disposition: A | Discharge status: Home / Self Care | Payer: Commercial Managed Care - PPO | Source: Ambulatory Visit | Attending: Gastroenterology | Admitting: Gastroenterology

## 2014-11-27 DIAGNOSIS — R1310 Dysphagia, unspecified: Secondary | ICD-10-CM

## 2016-06-20 DIAGNOSIS — Z8571 Personal history of Hodgkin lymphoma: Secondary | ICD-10-CM | POA: Diagnosis not present

## 2016-06-20 DIAGNOSIS — Z9081 Acquired absence of spleen: Secondary | ICD-10-CM | POA: Diagnosis not present

## 2016-06-20 DIAGNOSIS — Z808 Family history of malignant neoplasm of other organs or systems: Secondary | ICD-10-CM | POA: Diagnosis not present

## 2016-06-20 DIAGNOSIS — Z8543 Personal history of malignant neoplasm of ovary: Secondary | ICD-10-CM | POA: Diagnosis not present

## 2016-06-20 DIAGNOSIS — D72829 Elevated white blood cell count, unspecified: Secondary | ICD-10-CM | POA: Diagnosis not present

## 2016-06-20 DIAGNOSIS — Z8542 Personal history of malignant neoplasm of other parts of uterus: Secondary | ICD-10-CM | POA: Diagnosis not present

## 2016-06-20 DIAGNOSIS — D473 Essential (hemorrhagic) thrombocythemia: Secondary | ICD-10-CM | POA: Diagnosis not present

## 2018-10-21 ENCOUNTER — Telehealth: Payer: Self-pay | Admitting: Hematology and Oncology

## 2018-10-21 NOTE — Telephone Encounter (Signed)
Received a new hem referral from Dr. London Pepper for high wbc. Jeanne Sims has been cld and scheduled to see Dr. Lorenso Courier on 10/21 at 2pm. Pt aware to arrive 15 minutes early.

## 2018-10-25 ENCOUNTER — Telehealth: Payer: Self-pay | Admitting: Hematology and Oncology

## 2018-10-25 NOTE — Telephone Encounter (Signed)
Scheduled appt per 10/19 sch message - left message for patient with appt date and time

## 2018-10-26 ENCOUNTER — Other Ambulatory Visit: Payer: Self-pay | Admitting: Hematology and Oncology

## 2018-10-26 DIAGNOSIS — D473 Essential (hemorrhagic) thrombocythemia: Secondary | ICD-10-CM

## 2018-10-26 DIAGNOSIS — D75839 Thrombocytosis, unspecified: Secondary | ICD-10-CM

## 2018-10-26 DIAGNOSIS — D72823 Leukemoid reaction: Secondary | ICD-10-CM

## 2018-10-26 NOTE — Progress Notes (Signed)
[place] [phone]   Fax:([phone]) [place] NOTE Patient Care Team: Bonnita Nasuti, MD as PCP - General (Internal Medicine)  Hematological/Oncological History # Chronic Leukocytosis/Thrombocytosis, Unclear Etiology A) 12/18/2009: WBC 11.1, Hgb 14.6, Plt 422. Only prior lab on record. Further workup at that time was for a UTI  B) 09/21/2018: WBC 13.3, Hgb 12.9, Plt 549 C)10/26/2018: Establish care with Dr. Lorenso Courier. WBC 14.0, Hgb 12.7, Plt 536  #Hodgkin Disease, s/p chemotherapy and radiation 1) patient reports she was treated by Dr. Sonny Dandy here at [facility] in 1987 2) Received chemotherapy (? Regimen) and radiation therapy   #Ovarian Cancer/Cervical Cancer 1) discovered after giving birth at age 25 and had continual post-partum bleeding 2) s/p TAH/BSO. Patient noted she had both ovarian and cervical cancer.  CHIEF COMPLAINTS/PURPOSE OF CONSULTATION:  "I have high white blood cells "  HISTORY OF PRESENTING ILLNESS:  Jeanne Sims 63 y.o. female with medical history significant for ovarian cancer (age 17, s/p TAH/BSO), and Hodgkin disease (1987) who presents for evaluation of leukocytosis and thrombocytosis.  The patient has previous records in the [facility] health system which indicate a white blood cell count of 11.1 and platelets of 422.  This however was in the context of a work-up for urinary tract infection.  There are currently no records in our system for the Hodgkin disease ovarian cancer or cervical cancer as listed in her prior history. The most recent records from 09/21/2018 reveal WBC 13.3, Hgb 12.9, Plt 549 with ANC 7.2 (elevated) and Absolute Lymphs 4.5 (elevated). Due to concern for these elevations she was referred for further evaluation and management.   Ms. Picco reports today that she has been having issues with drenching night sweats (reports that she can wring out her clothes and sheets). She reports this has been happening over  the last 9 months and has been getting progressively worse. She denies any frank fevers, rash, itching, or diarrhea. She has not had any rhinorrhea or sore throat. She denies any lymphadenopathy in the neck, chest, underarms or groin. She denies any weight loss during this time period. No reports of bleeding or bruising.   On discussion of her prior cancers the patient notes her primary symptom when diagnosed in 1987 with Hodgkin disease was itching. She was treated with chemotherapy (unsure of the regimen) and radiation therapy. She was treated by Dr. Sonny Dandy in the [facility] system (though our records to not date back to 1987). It was at this time that the patient lost her hearing (which was poor prior to chemotherapy). Her ovarian/cervical cancer was found after childbirth at age 25 and treated with TAH/BSO and no further therapy. Of note she does have a brother with a "blood cancer" that reduces his Hgb and her mother/sister also had GYN cancers. The patient reports she has undergone genetic testing at [facility].   A 10 point ROS is detailed below.    MEDICAL HISTORY:  Past Medical History:  Diagnosis Date   Acquired deafness    Cervical cancer 1075   Cervical cancer    Diabetes mellitus without complication    Dyspareunia    Heart murmur    Hodgkin disease 1987   Ovarian cancer    age 42   Thyroid disease     SURGICAL HISTORY: Past Surgical History:  Procedure Laterality Date   ABDOMINAL HYSTERECTOMY     TAH/BSO at age 87 d/t cervical CA   BASAL CELL CARCINOMA EXCISION  face   BOWEL RESECTION     followed splenectomy.  had bowel obstruction.   COCHLEAR IMPLANT  1983   Dr. Thornell Mule   SPLENECTOMY      SOCIAL HISTORY: Social History   Socioeconomic History   Marital status: Married    Spouse name: Not on file   Number of children: Not on file   Years of education: Not on file   Highest education level: Not on file  Occupational History    Not on file  Social Needs   Financial resource strain: Not on file   Food insecurity    Worry: Not on file    Inability: Not on file   Transportation needs    Medical: Not on file    Non-medical: Not on file  Tobacco Use   Smoking status: Never Smoker  Substance and Sexual Activity   Alcohol use: No   Drug use: No   Sexual activity: Never    Comment: TAH/BSO  Lifestyle   Physical activity    Days per week: Not on file    Minutes per session: Not on file   Stress: Not on file  Relationships   Social connections    Talks on phone: Not on file    Gets together: Not on file    Attends religious service: Not on file    Active member of club or organization: Not on file    Attends meetings of clubs or organizations: Not on file    Relationship status: Not on file   Intimate partner violence    Fear of current or ex partner: Not on file    Emotionally abused: Not on file    Physically abused: Not on file    Forced sexual activity: Not on file  Other Topics Concern   Not on file  Social History Narrative   Not on file    FAMILY HISTORY: Family History  Problem Relation Age of Onset   Heart failure Mother    Diabetes Mother    Diabetes Brother    Breast cancer Paternal Uncle    Diabetes Sister    Cervical cancer Sister    Diabetes Sister    Cervical cancer Sister    Diabetes Sister    Diabetes Brother     ALLERGIES:  has No Known Allergies.  MEDICATIONS:  Current Outpatient Medications  Medication Sig Dispense Refill   aspirin 81 MG chewable tablet Chew 81 mg by mouth daily.     Cinnamon 500 MG capsule Take 2,000 mg by mouth 2 (two) times daily.     Cyanocobalamin 1000 MCG/ML KIT Inject as directed every 30 (thirty) days.     dexlansoprazole (DEXILANT) 60 MG capsule Take 60 mg by mouth daily.     Dulaglutide (TRULICITY) 1.5 KJ/1.7HX SOPN Inject into the skin once a week.     estradiol (CLIMARA - DOSED IN MG/24 HR) 0.025 mg/24hr  patch Place 0.025 mg onto the skin once a week.     folic acid (FOLVITE) 505 MCG tablet Take 800 mcg by mouth daily.     levothyroxine (EUTHYROX) 175 MCG tablet Take 175 mcg by mouth daily before breakfast.     linaclotide (LINZESS) 290 MCG CAPS capsule Take 290 mcg by mouth daily before breakfast.     metoprolol succinate (TOPROL-XL) 25 MG 24 hr tablet Take 25 mg by mouth daily.     Vitamin D, Ergocalciferol, (DRISDOL) 1.25 MG (50000 UT) CAPS capsule Take 50,000 Units by mouth every 7 (  seven) days.     metFORMIN (GLUCOPHAGE) 500 MG tablet Take 500 mg by mouth 2 (two) times daily with a meal.     rosuvastatin (CRESTOR) 10 MG tablet Take 10 mg by mouth daily.     No current facility-administered medications for this visit.     REVIEW OF SYSTEMS:   Constitutional: ( - ) fevers, ( - )  chills , (+ ) night sweats Eyes: ( - ) blurriness of vision, ( - ) double vision, ( - ) watery eyes Ears, nose, mouth, throat, and face: ( - ) mucositis, ( - ) sore throat Respiratory: ( - ) cough, ( +) dyspnea, ( - ) wheezes Cardiovascular: ( - ) palpitation, ( - ) chest discomfort, ( - ) lower extremity swelling Gastrointestinal:  ( - ) nausea, ( + ) heartburn, ( - ) change in bowel habits Skin: ( - ) abnormal skin rashes Lymphatics: ( - ) new lymphadenopathy, ( - ) easy bruising Neurological: ( - ) numbness, ( - ) tingling, ( - ) new weaknesses Behavioral/Psych: ( - ) mood change, ( - ) new changes  All other systems were reviewed with the patient and are negative.  PHYSICAL EXAMINATION: ECOG PERFORMANCE STATUS: 1 - Symptomatic but completely ambulatory  Vitals:   10/27/18 1438  BP: 134/60  Pulse: 78  Resp: 18  Temp: 98.5 F (36.9 C)  SpO2: 97%   Filed Weights   10/27/18 1438  Weight: 191 lb 14.4 oz (87 kg)    GENERAL: well appearing middle aged Caucasian female in NAD  SKIN: skin color, texture, turgor are normal, no rashes or significant lesions EYES: conjunctiva are pink and  non-injected, sclera clear NECK: supple, non-tender LYMPH:  no palpable lymphadenopathy in the cervical, axillary or supraclavicular (scar over left clavicular) LUNGS: clear to auscultation and percussion with normal breathing effort HEART: regular rate & rhythm and no murmurs and no lower extremity edema ABDOMEN: soft, non-tender, non-distended, normal bowel sounds Musculoskeletal: no cyanosis of digits and no clubbing  PSYCH: alert & oriented x 3, fluent speech NEURO: no focal motor/sensory deficits  LABORATORY DATA:  I have reviewed the data as listed Lab Results  Component Value Date   WBC 14.0 (H) 10/27/2018   HGB 12.7 10/27/2018   HCT 37.6 10/27/2018   MCV 92.6 10/27/2018   PLT 536 (H) 10/27/2018   NEUTROABS 6.6 10/27/2018   Outside labs ( 09/21/2018) indicate: Absolute lymphs 4.5 (nml 0.7-3.1), Neutrophils 7.2 (nml 1.4-7.0). WBC 13.3 (nml 3.4-10.8), Hgb 12.9 (WNL), and plt 549 (nml 150-450)  BLOOD FILM:  I personally reviewed the patient's peripheral blood smear today.  There was no peripheral blasts.  The white blood cells and red blood cells were of normal morphology with no dacrocytes or dysmorphic RBCs. No abnormal lymphocytes noted. There was no schistocytosis or anisocytosis.  The platelets are of normal size and markedly increased in number. I have verified that there were no platelet clumping.  RADIOGRAPHIC STUDIES: No results found.  ASSESSMENT & PLAN Mrs. Eloyse Causey is a 63 year old female with medical history significant for ovarian cancer, and Hodgkin disease (1987) who presents for evaluation of leukocytosis and thrombocytosis.  The patient has no clear inflammatory or infectious symptoms to explain this rise in her platelet and white blood cell counts.  Patient has recorded elevations in 2011 and most recently in September 2020 however we do not have any records of lab results from the interim.  My concern is that this is  driven by an MPN versus potentially a  recurrence of her Hodgkin's disease (considerably less likely as this leukocytosis and thrombocytosis have existed since 2011). Also after 30 years out from her treatment and remission the odds of have Hodgkin's lymphoma again is equivalent with that of the general population (J Clin Oncol. 1993 Feb; 11(2):225-32). The correlation between Hodgkin disease and MPN's is noted in scattered case reports throughout the literature, however there is no strong correlation. Secondary neoplasms from the treatment of Hodgkin lymphoma are not uncommon and should also be considered (J Oral Maxillofac Pathol. 2014 Sep;18(Suppl 1):S90-5) .   Mrs. 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 MPN gene testing and performing a bone marrow biopsy will confirm this suspicion. She does endorse having a high platelet count for many year, dating back to the 1987 Hodgkin diagnosis, making this appear more like a recent exacerbation of a long standing condition.   #Leukocytosis/Thrombocytosis, Unclear Etiology --possible etiologies include MPN, inflammatory reaction, or recurrence of her Hodgkin's lymphoma. --will repeat CBC today and review peripheral blood film for any concerning abnormalities.  --further send out labs to include MPN workup for JAK2, CALR, MPL, and BCR/ABL --CRP, ESR and ferritin to assess for a chronic inflammatory cause of these findings.  --iron studies and Epo level to assure thrombocytosis is not driven by iron deficiency --will schedule patient for a bone marrow biopsy pending the return of the labs ordered today. Also consider CT scan to determine lymphoma reoccurrence vs. new secondary lymphoma, however the odds of this are significantly lower.  --RTC in 3 weeks to reassess blood counts and determine treatment moving forward  #Hodgkin Lymphoma, in remission --records from [place] not in our  system --patient treated with chemotherapy and radiation --unlikely recurrence, but considered as noted above  All questions were answered. The patient knows to call the clinic with any problems, questions or concerns.  A total of more than 60 minutes were spent face-to-face with the patient during this encounter and over half of that time was spent on counseling and coordination of care as outlined above.   Ledell Peoples, MD Department of Hematology/Oncology [place] at [facility] Phone: [phone] Pager: [phone] Email: [email or web address]  Literature Support  Late relapse in early-stage Hodgkin's disease patients enrolled on [employer] Organization for Research and Treatment of Cancer protocols. Bodis S, Henry-Amar M, Bosq J, Burgers JM, Mellink [place], [place], Dupouy N, Noordijk EM, [employer], [employer]. 1993 Feb; 11(2):225-32.  Half the recurrences happen within 2 years of primary treatment and up to 90% occur before 5 years. Occurrence of a relapse after 10 years is rare and after 15 years the risk of developing lymphoma is same as its risk in the normal population.  Norval EJ, Raubenheimer EJ. Second malignancies in Hodgkin's disease: A review of the literature and report of a case with a secondary Lennert's lymphoma. J Oral Maxillofac Pathol. 2014 Sep;18(Suppl 1):S90-5. doi: 10.4103/0973-029X.141332. PMID: 55732202; PMCID: [id number].  A small percentage of patients treated for Hodgkin's disease are at risk of developing a second malignancy. The appearance of secondary malignancies such as leukemia, carcinoma or non-Hodgkin's lymphomas may be attributed to the mutagenic effects of chemotherapy and/or radiotherapy. Most secondary non-Hodgkin's lymphomas are of the B-cell type, but isolated cases were reportedly of a T-cell lineage.    10/27/2018 4:51 PM

## 2018-10-27 ENCOUNTER — Inpatient Hospital Stay: Payer: Commercial Managed Care - PPO | Admitting: Hematology and Oncology

## 2018-10-27 ENCOUNTER — Encounter: Payer: Commercial Managed Care - PPO | Admitting: Hematology and Oncology

## 2018-10-27 ENCOUNTER — Encounter: Payer: Self-pay | Admitting: Hematology and Oncology

## 2018-10-27 ENCOUNTER — Other Ambulatory Visit: Payer: Commercial Managed Care - PPO

## 2018-10-27 ENCOUNTER — Inpatient Hospital Stay: Payer: Commercial Managed Care - PPO | Attending: Hematology and Oncology

## 2018-10-27 ENCOUNTER — Other Ambulatory Visit: Payer: Self-pay

## 2018-10-27 VITALS — BP 134/60 | HR 78 | Temp 98.5°F | Resp 18 | Ht 66.0 in | Wt 191.9 lb

## 2018-10-27 DIAGNOSIS — D473 Essential (hemorrhagic) thrombocythemia: Secondary | ICD-10-CM

## 2018-10-27 DIAGNOSIS — Z8543 Personal history of malignant neoplasm of ovary: Secondary | ICD-10-CM | POA: Insufficient documentation

## 2018-10-27 DIAGNOSIS — Z9221 Personal history of antineoplastic chemotherapy: Secondary | ICD-10-CM

## 2018-10-27 DIAGNOSIS — E119 Type 2 diabetes mellitus without complications: Secondary | ICD-10-CM | POA: Insufficient documentation

## 2018-10-27 DIAGNOSIS — Z79899 Other long term (current) drug therapy: Secondary | ICD-10-CM | POA: Diagnosis not present

## 2018-10-27 DIAGNOSIS — D72829 Elevated white blood cell count, unspecified: Secondary | ICD-10-CM | POA: Diagnosis present

## 2018-10-27 DIAGNOSIS — E079 Disorder of thyroid, unspecified: Secondary | ICD-10-CM | POA: Diagnosis not present

## 2018-10-27 DIAGNOSIS — D72823 Leukemoid reaction: Secondary | ICD-10-CM | POA: Diagnosis not present

## 2018-10-27 DIAGNOSIS — D75839 Thrombocytosis, unspecified: Secondary | ICD-10-CM

## 2018-10-27 DIAGNOSIS — C8190 Hodgkin lymphoma, unspecified, unspecified site: Secondary | ICD-10-CM | POA: Diagnosis not present

## 2018-10-27 DIAGNOSIS — Z923 Personal history of irradiation: Secondary | ICD-10-CM | POA: Diagnosis not present

## 2018-10-27 DIAGNOSIS — R61 Generalized hyperhidrosis: Secondary | ICD-10-CM

## 2018-10-27 DIAGNOSIS — R7989 Other specified abnormal findings of blood chemistry: Secondary | ICD-10-CM | POA: Diagnosis not present

## 2018-10-27 LAB — CMP (CANCER CENTER ONLY)
ALT: 28 U/L (ref 0–44)
AST: 29 U/L (ref 15–41)
Albumin: 4 g/dL (ref 3.5–5.0)
Alkaline Phosphatase: 59 U/L (ref 38–126)
Anion gap: 11 (ref 5–15)
BUN: 10 mg/dL (ref 8–23)
CO2: 27 mmol/L (ref 22–32)
Calcium: 9.3 mg/dL (ref 8.9–10.3)
Chloride: 104 mmol/L (ref 98–111)
Creatinine: 0.7 mg/dL (ref 0.44–1.00)
GFR, Est AFR Am: >60 mL/min (ref >60)
GFR, Estimated: >60 mL/min (ref >60)
Glucose, Bld: 103 mg/dL — ABNORMAL HIGH (ref 70–99)
Potassium: 4.1 mmol/L (ref 3.5–5.1)
Sodium: 142 mmol/L (ref 135–145)
Total Bilirubin: 0.2 mg/dL — ABNORMAL LOW (ref 0.3–1.2)
Total Protein: 7.2 g/dL (ref 6.5–8.1)

## 2018-10-27 LAB — CBC WITH DIFFERENTIAL (CANCER CENTER ONLY)
Abs Immature Granulocytes: 0.05 10*3/uL (ref 0.00–0.07)
Basophils Absolute: 0.2 10*3/uL — ABNORMAL HIGH (ref 0.0–0.1)
Basophils Relative: 1 %
Eosinophils Absolute: 0.3 10*3/uL (ref 0.0–0.5)
Eosinophils Relative: 2 %
HCT: 37.6 % (ref 36.0–46.0)
Hemoglobin: 12.7 g/dL (ref 12.0–15.0)
Immature Granulocytes: 0 %
Lymphocytes Relative: 40 %
Lymphs Abs: 5.6 10*3/uL — ABNORMAL HIGH (ref 0.7–4.0)
MCH: 31.3 pg (ref 26.0–34.0)
MCHC: 33.8 g/dL (ref 30.0–36.0)
MCV: 92.6 fL (ref 80.0–100.0)
Monocytes Absolute: 1.3 10*3/uL — ABNORMAL HIGH (ref 0.1–1.0)
Monocytes Relative: 9 %
Neutro Abs: 6.6 10*3/uL (ref 1.7–7.7)
Neutrophils Relative %: 48 %
Platelet Count: 536 10*3/uL — ABNORMAL HIGH (ref 150–400)
RBC: 4.06 MIL/uL (ref 3.87–5.11)
RDW: 13.2 % (ref 11.5–15.5)
WBC Count: 14 10*3/uL — ABNORMAL HIGH (ref 4.0–10.5)
nRBC: 0 % (ref 0.0–0.2)

## 2018-10-27 LAB — C-REACTIVE PROTEIN: CRP: <0.8 mg/dL (ref <1.0)

## 2018-10-27 LAB — IRON AND TIBC
Iron: 43 ug/dL (ref 41–142)
Saturation Ratios: 12 % — ABNORMAL LOW (ref 21–57)
TIBC: 353 ug/dL (ref 236–444)
UIBC: 309 ug/dL (ref 120–384)

## 2018-10-27 LAB — SEDIMENTATION RATE: Sed Rate: 8 mm/hr (ref 0–22)

## 2018-10-27 LAB — TSH: TSH: <0.08 u[IU]/mL — ABNORMAL LOW (ref 0.308–3.960)

## 2018-10-27 LAB — RETICULOCYTES
Immature Retic Fract: 8.8 % (ref 2.3–15.9)
RBC.: 4.12 MIL/uL (ref 3.87–5.11)
Retic Count, Absolute: 96.8 10*3/uL (ref 19.0–186.0)
Retic Ct Pct: 2.4 % (ref 0.4–3.1)

## 2018-10-27 LAB — SAVE SMEAR(SSMR), FOR PROVIDER SLIDE REVIEW

## 2018-10-27 LAB — LACTATE DEHYDROGENASE: LDH: 193 U/L — ABNORMAL HIGH (ref 98–192)

## 2018-10-27 LAB — FERRITIN: Ferritin: 20 ng/mL (ref 11–307)

## 2018-10-27 NOTE — Patient Instructions (Signed)
Here is some information about essential thrombocytosis or essential thrombocythemia (ET). This is the diagnosis we are concerned for today.   Additionally we are concerned about reoccurrence of your Hodgkin lymphoma.   We have collected bloodwork today which we will analyze to try to determine what the cause of your findings are.   Essential Thrombocythemia  Essential thrombocythemia is a condition in which a person has too many platelets (thrombocytes) in the blood. Platelets are parts of blood that stick together and form a clot (thrombus) to help the body stop bleeding after an injury. This condition may also be called primary or essential thrombocytosis. Essential thrombocythemia happens when abnormal cells in the bone marrow (megakaryocytes) make too many platelets. What are the causes? The cause of this condition is not known. What are the signs or symptoms? This condition may not cause any symptoms. If you have symptoms, they may include:  Weakness.  Headache.  Itching.  Sweating.  Fever.  Dizziness or confusion.  Tingling or burning in your hands or feet.  Blood clots.  Bleeding.  Enlarged spleen. How is this diagnosed? This condition may be diagnosed based on:  A physical exam.  Your symptoms.  Your medical history.  Blood tests.  A procedure to collect a sample of your bone marrow (bone marrow aspiration) for testing. How is this treated? If you do not have symptoms, you may not need treatment. Your health care provider may monitor your condition with regular blood tests. If you have symptoms, or if your platelet count is very high, you may be treated with:  Aspirin or other medicines to thin the blood and prevent blood clots.  Medicines to reduce the number of platelets in your blood.  A procedure to remove some platelets from your blood (plateletpheresis). During this procedure: ? Your health care provider will place an IV into one of your veins. ?  The IV will be used to draw blood into a machine that separates out the extra platelets. ? The blood with reduced platelets will be returned to your body. Follow these instructions at home:  Take over-the-counter and prescription medicines only as told by your health care provider.  If you are taking blood thinners: ? Talk with your health care provider before you take any medicines that contain aspirin or NSAIDs. These medicines increase your risk for dangerous bleeding. ? Take your medicine exactly as told, at the same time every day. ? Avoid activities that could cause injury or bruising, and follow instructions about how to prevent falls. ? Wear a medical alert bracelet or carry a card that lists what medicines you take.  Tell all health care providers, including dentists, about any medicines you are taking to prevent blood clots.  Do not use any products that contain nicotine or tobacco, such as cigarettes and e-cigarettes. If you need help quitting, ask your health care provider.  Ask your health care provider about managing or preventing high cholesterol, high blood pressure, and diabetes. These conditions can make essential thrombocythemia worse.  Keep all follow-up visits as told by your health care provider. This is important. Contact a health care provider if:  You have severe pain, and medicines do not help.  You have problems taking your medicines to prevent blood clots.  You faint. Get help right away if:   You have bleeding or blood clots.  You have unusual bruises.  You have bloody or tarry stools.  You have pink or bloody urine.  Your menstrual periods  are heavier than normal, if applicable.  You have nosebleeds and bleeding gums.  You have chest pain.  You have trouble breathing.  You have any symptoms of a stroke. "BE FAST" is an easy way to remember the main warning signs of a stroke: ? B - Balance. Signs are dizziness, sudden trouble walking, or loss  of balance. ? E - Eyes. Signs are trouble seeing or a sudden change in vision. ? F - Face. Signs are sudden weakness or numbness of the face, or the face or eyelid drooping on one side. ? A - Arm. Signs are weakness or numbness in an arm. This happens suddenly and usually on one side of the body. ? S - Speech. Signs are sudden trouble speaking, slurred speech, or trouble understanding what people say. ? T - Time. Time to call emergency services. Write down what time symptoms started.  You have other signs of a stroke, such as: ? A sudden, severe headache with no known cause. ? Nausea or vomiting. ? Seizure. These symptoms may represent a serious problem that is an emergency. Do not wait to see if the symptoms will go away. Get medical help right away. Call your local emergency services (911 in the [place] Do not drive yourself to the hospital. Summary  Essential thrombocythemia happens when abnormal cells in the bone marrow make too many platelets.  If you have symptoms, or if your platelet count is very high, you may need treatment.  Treatment can vary and may include medicines to thin the blood and prevent blood clots.  Ask your health care provider about how to manage or prevent high cholesterol, high blood pressure, and diabetes. These conditions can make essential thrombocythemia worse.  Get help right away if you have any symptoms of stroke. This information is not intended to replace advice given to you by your health care provider. Make sure you discuss any questions you have with your health care provider. Document Released: 05/09/2016 Document Revised: 04/15/2018 Document Reviewed: 05/09/2016 Elsevier Patient Education  [place].

## 2018-10-28 ENCOUNTER — Telehealth: Payer: Self-pay | Admitting: Hematology and Oncology

## 2018-10-28 LAB — ERYTHROPOIETIN: Erythropoietin: 9.6 m[IU]/mL (ref 2.6–18.5)

## 2018-10-28 NOTE — Telephone Encounter (Signed)
I talk with patient regarding schedule  

## 2018-10-29 LAB — SURGICAL PATHOLOGY

## 2018-11-02 ENCOUNTER — Other Ambulatory Visit: Payer: Self-pay | Admitting: Hematology and Oncology

## 2018-11-02 DIAGNOSIS — D72829 Elevated white blood cell count, unspecified: Secondary | ICD-10-CM

## 2018-11-04 LAB — FLOW CYTOMETRY

## 2018-11-05 ENCOUNTER — Telehealth: Payer: Self-pay | Admitting: *Deleted

## 2018-11-05 NOTE — Telephone Encounter (Signed)
Received call back from patient. Reviewed date and time for CT Scan and reviewed instructions. Pt will stop by the cancer center to pick up her contrast before 11/4

## 2018-11-05 NOTE — Telephone Encounter (Signed)
TCT patient. No answer but was able to leave vm message regarding CT scan.  Scan is scheduled for 11/4 @ 1:30pm. Called to confirm this patient. Asked pt to return call @ [phone]

## 2018-11-10 ENCOUNTER — Ambulatory Visit (HOSPITAL_COMMUNITY)
Admission: RE | Admit: 2018-11-10 | Discharge: 2018-11-10 | Disposition: A | Discharge status: Home / Self Care | Payer: Commercial Managed Care - PPO | Source: Ambulatory Visit | Attending: Hematology and Oncology | Admitting: Hematology and Oncology

## 2018-11-10 ENCOUNTER — Other Ambulatory Visit: Payer: Self-pay

## 2018-11-10 DIAGNOSIS — D72829 Elevated white blood cell count, unspecified: Secondary | ICD-10-CM | POA: Insufficient documentation

## 2018-11-10 LAB — JAK2 (INCLUDING V617F AND EXON 12), MPL,& CALR-NEXT GEN SEQ

## 2018-11-10 MED ORDER — SODIUM CHLORIDE (PF) 0.9 % IJ SOLN
INTRAMUSCULAR | Status: AC
  Filled 2018-11-10: qty 50

## 2018-11-10 MED ORDER — IOHEXOL 300 MG/ML  SOLN
100.0000 mL | Freq: Once | INTRAMUSCULAR | Status: AC | PRN
  Administered 2018-11-10: 100 mL via INTRAVENOUS

## 2018-11-12 LAB — BCR ABL1 FISH (GENPATH)

## 2018-11-16 ENCOUNTER — Other Ambulatory Visit: Payer: Self-pay | Admitting: *Deleted

## 2018-11-16 DIAGNOSIS — D473 Essential (hemorrhagic) thrombocythemia: Secondary | ICD-10-CM

## 2018-11-16 DIAGNOSIS — D75839 Thrombocytosis, unspecified: Secondary | ICD-10-CM

## 2018-11-17 ENCOUNTER — Inpatient Hospital Stay: Payer: Commercial Managed Care - PPO

## 2018-11-17 ENCOUNTER — Other Ambulatory Visit: Payer: Self-pay

## 2018-11-17 ENCOUNTER — Inpatient Hospital Stay: Payer: Commercial Managed Care - PPO | Attending: Hematology and Oncology | Admitting: Hematology and Oncology

## 2018-11-17 VITALS — BP 124/58 | HR 80 | Temp 98.7°F | Resp 18 | Ht 66.0 in | Wt 192.3 lb

## 2018-11-17 DIAGNOSIS — C8190 Hodgkin lymphoma, unspecified, unspecified site: Secondary | ICD-10-CM | POA: Insufficient documentation

## 2018-11-17 DIAGNOSIS — Z8543 Personal history of malignant neoplasm of ovary: Secondary | ICD-10-CM | POA: Diagnosis not present

## 2018-11-17 DIAGNOSIS — E079 Disorder of thyroid, unspecified: Secondary | ICD-10-CM | POA: Diagnosis not present

## 2018-11-17 DIAGNOSIS — D473 Essential (hemorrhagic) thrombocythemia: Secondary | ICD-10-CM

## 2018-11-17 DIAGNOSIS — D72829 Elevated white blood cell count, unspecified: Secondary | ICD-10-CM

## 2018-11-17 DIAGNOSIS — R61 Generalized hyperhidrosis: Secondary | ICD-10-CM | POA: Insufficient documentation

## 2018-11-17 DIAGNOSIS — D75839 Thrombocytosis, unspecified: Secondary | ICD-10-CM

## 2018-11-17 LAB — CBC WITH DIFFERENTIAL (CANCER CENTER ONLY)
Abs Immature Granulocytes: 0.05 10*3/uL (ref 0.00–0.07)
Basophils Absolute: 0.2 10*3/uL — ABNORMAL HIGH (ref 0.0–0.1)
Basophils Relative: 1 %
Eosinophils Absolute: 0.3 10*3/uL (ref 0.0–0.5)
Eosinophils Relative: 2 %
HCT: 38.5 % (ref 36.0–46.0)
Hemoglobin: 12.6 g/dL (ref 12.0–15.0)
Immature Granulocytes: 0 %
Lymphocytes Relative: 41 %
Lymphs Abs: 5.6 10*3/uL — ABNORMAL HIGH (ref 0.7–4.0)
MCH: 30.4 pg (ref 26.0–34.0)
MCHC: 32.7 g/dL (ref 30.0–36.0)
MCV: 93 fL (ref 80.0–100.0)
Monocytes Absolute: 1.2 10*3/uL — ABNORMAL HIGH (ref 0.1–1.0)
Monocytes Relative: 9 %
Neutro Abs: 6.3 10*3/uL (ref 1.7–7.7)
Neutrophils Relative %: 47 %
Platelet Count: 519 10*3/uL — ABNORMAL HIGH (ref 150–400)
RBC: 4.14 MIL/uL (ref 3.87–5.11)
RDW: 13.3 % (ref 11.5–15.5)
WBC Count: 13.6 10*3/uL — ABNORMAL HIGH (ref 4.0–10.5)
nRBC: 0 % (ref 0.0–0.2)

## 2018-11-17 NOTE — Progress Notes (Signed)
[name] [phone]   [phone]  PROGRESS NOTE  Patient Care Team: Bonnita Nasuti, MD as PCP - General (Internal Medicine)  Hematological/Oncological History # Chronic Leukocytosis/Thrombocytosis, Unclear Etiology A) 12/18/2009: WBC 11.1, Hgb 14.6, Plt 422. Only prior lab on record. Further workup at that time was for a UTI  B) 09/21/2018: WBC 13.3, Hgb 12.9, Plt 549 C)10/26/2018: Establish care with Dr. Lorenso Courier. WBC 14.0, Hgb 12.7, Plt 536  #Hodgkin Disease, s/p chemotherapy and radiation 1) patient reports she was treated by Dr. Sonny Dandy here at [facility] in 1987 2) Received chemotherapy (? Regimen) and radiation therapy   #Ovarian Cancer/Cervical Cancer 1) discovered after giving birth at age 55 and had continual post-partum bleeding 2) s/p TAH/BSO. Patient noted she had both ovarian and cervical cancer.  Interval History:  Jeanne Sims 63 y.o. female with medical history significant for ovarian cancer (age 50, s/p TAH/BSO), and Hodgkin disease (1987) who presents for a follow up visit. She was last seen here when she established care on 10/27/18.   In the interim since her last visit she has undergone extensive testing for her leukocytosis and thrombocytosis. Her MPN workup was negative and her CT C/A/P showed no signs of lymphadenopathy/lymphoma. Inflammatory markers were within normal limits. The only concerning abnormality was low iron with no anemia/microcytosis.  She notes that she has continued to have night sweats in the interim. She wakes up approximately twice per night. She has checked her sugar before during one episode and notes it was normal. She has had no new symptoms since our last visit on 10/21.   A 10 point ROS is listed below.   MEDICAL HISTORY:  Past Medical History:  Diagnosis Date   Acquired deafness    Cervical cancer ([name]) 1075   Cervical cancer ([name])    Diabetes mellitus without complication ([name])     Dyspareunia    Heart murmur    Hodgkin disease ([name]) 1987   Ovarian cancer ([name])    age 52   Thyroid disease     SURGICAL HISTORY: Past Surgical History:  Procedure Laterality Date   ABDOMINAL HYSTERECTOMY     TAH/BSO at age 34 d/t cervical CA   BASAL CELL CARCINOMA EXCISION     face   BOWEL RESECTION     followed splenectomy.  had bowel obstruction.   COCHLEAR IMPLANT  1983   Dr. Thornell Mule   SPLENECTOMY      SOCIAL HISTORY: Social History   Socioeconomic History   Marital status: Married    Spouse name: Not on file   Number of children: Not on file   Years of education: Not on file   Highest education level: Not on file  Occupational History   Not on file  Social Needs   Financial resource strain: Not on file   Food insecurity    Worry: Not on file    Inability: Not on file   Transportation needs    Medical: Not on file    Non-medical: Not on file  Tobacco Use   Smoking status: Never Smoker   Smokeless tobacco: Never Used  Substance and Sexual Activity   Alcohol use: No   Drug use: No   Sexual activity: Never    Comment: TAH/BSO  Lifestyle   Physical activity    Days per week: Not on file    Minutes per session: Not on file   Stress: Not on file  Relationships   Social connections  Talks on phone: Not on file    Gets together: Not on file    Attends religious service: Not on file    Active member of club or organization: Not on file    Attends meetings of clubs or organizations: Not on file    Relationship status: Not on file   Intimate partner violence    Fear of current or ex partner: Not on file    Emotionally abused: Not on file    Physically abused: Not on file    Forced sexual activity: Not on file  Other Topics Concern   Not on file  Social History Narrative   Not on file    FAMILY HISTORY: Family History  Problem Relation Age of Onset   Heart failure Mother    Diabetes Mother    Diabetes Brother     Breast cancer Paternal Uncle    Diabetes Sister    Cervical cancer Sister    Diabetes Sister    Cervical cancer Sister    Diabetes Sister    Diabetes Brother     ALLERGIES:  has No Known Allergies.  MEDICATIONS:  Current Outpatient Medications  Medication Sig Dispense Refill   aspirin 81 MG chewable tablet Chew 81 mg by mouth daily.     Cinnamon 500 MG capsule Take 2,000 mg by mouth 2 (two) times daily.     Cyanocobalamin 1000 MCG/ML KIT Inject as directed every 30 (thirty) days.     dexlansoprazole (DEXILANT) 60 MG capsule Take 60 mg by mouth daily.     Dulaglutide (TRULICITY) 1.5 XB/1.4NW SOPN Inject into the skin once a week.     estradiol (CLIMARA - DOSED IN MG/24 HR) 0.025 mg/24hr patch Place 0.025 mg onto the skin once a week.     folic acid (FOLVITE) 295 MCG tablet Take 800 mcg by mouth daily.     levothyroxine (EUTHYROX) 175 MCG tablet Take 175 mcg by mouth daily before breakfast.     linaclotide (LINZESS) 290 MCG CAPS capsule Take 290 mcg by mouth daily before breakfast.     metFORMIN (GLUCOPHAGE) 500 MG tablet Take 500 mg by mouth 2 (two) times daily with a meal.     metoprolol succinate (TOPROL-XL) 25 MG 24 hr tablet Take 25 mg by mouth daily.     rosuvastatin (CRESTOR) 10 MG tablet Take 10 mg by mouth daily.     Vitamin D, Ergocalciferol, (DRISDOL) 1.25 MG (50000 UT) CAPS capsule Take 50,000 Units by mouth every 7 (seven) days.     No current facility-administered medications for this visit.     REVIEW OF SYSTEMS:   Constitutional: ( - ) fevers, ( + )  chills , ( + ) night sweats Eyes: ( - ) blurriness of vision, ( - ) double vision, ( - ) watery eyes Ears, nose, mouth, throat, and face: ( - ) mucositis, ( - ) sore throat Respiratory: ( - ) cough, ( - ) dyspnea, ( - ) wheezes Cardiovascular: ( - ) palpitation, ( - ) chest discomfort, ( - ) lower extremity swelling Gastrointestinal:  ( - ) nausea, ( - ) heartburn, ( - ) change in bowel habits Skin:  ( - ) abnormal skin rashes Lymphatics: ( - ) new lymphadenopathy, ( - ) easy bruising Neurological: ( - ) numbness, ( - ) tingling, ( - ) new weaknesses Behavioral/Psych: ( - ) mood change, ( - ) new changes  All other systems were reviewed with the patient and are  negative.  PHYSICAL EXAMINATION: ECOG PERFORMANCE STATUS: 0 - Asymptomatic  There were no vitals filed for this visit. There were no vitals filed for this visit.  GENERAL: well appearing caucasian female in NAD  SKIN: skin color, texture, turgor are normal, no rashes or significant lesions EYES: conjunctiva are pink and non-injected, sclera clear LUNGS: clear to auscultation and percussion with normal breathing effort HEART: regular rate & rhythm and no murmurs and no lower extremity edema ABDOMEN: soft, non-tender, non-distended, normal bowel sounds Musculoskeletal: no cyanosis of digits and no clubbing  PSYCH: alert & oriented x 3, fluent speech NEURO: no focal motor/sensory deficits  LABORATORY DATA:  I have reviewed the data as listed Lab Results  Component Value Date   WBC 14.0 (H) 10/27/2018   HGB 12.7 10/27/2018   HCT 37.6 10/27/2018   MCV 92.6 10/27/2018   PLT 536 (H) 10/27/2018   NEUTROABS 6.6 10/27/2018   Lab Results  Component Value Date   IRON 43 10/27/2018   TIBC 353 10/27/2018   FERRITIN 20 10/27/2018     RADIOGRAPHIC STUDIES: I have personally reviewed the radiological images as listed and agreed with the findings in the report: no concerning lymphadenopathy or evidence of recurrent disease.  Ct Chest W Contrast  Result Date: 11/11/2018 CLINICAL DATA:  Remote history of non-Hodgkin's lymphoma treated with chemotherapy and radiation therapy in 1987. Remote history of cervical and ovarian cancer in 1977 status posthysterectomy. Patient now presents with night sweats and itching for 6-8 months. Prior splenectomy. EXAM: CT CHEST, ABDOMEN, AND PELVIS WITH CONTRAST TECHNIQUE: Multidetector CT imaging  of the chest, abdomen and pelvis was performed following the standard protocol during bolus administration of intravenous contrast. CONTRAST:  15m OMNIPAQUE IOHEXOL 300 MG/ML  SOLN COMPARISON:  11/14/2016 CT abdomen/pelvis FINDINGS: CT CHEST FINDINGS Cardiovascular: Normal heart size. No significant pericardial effusion/thickening. Three-vessel coronary atherosclerosis. Atherosclerotic nonaneurysmal thoracic aorta. Normal caliber pulmonary arteries. No central pulmonary emboli. Mediastinum/Nodes: Thyroid is either atrophic or surgically absent. Unremarkable esophagus. No pathologically enlarged axillary, mediastinal or hilar lymph nodes. Lungs/Pleura: No pneumothorax. No pleural effusion. No acute consolidative airspace disease, lung masses or significant pulmonary nodules. There is patchy ground-glass opacity and reticulation in medial apical upper lobes bilaterally with sharp lateral margins compatible with mild radiation fibrosis. Musculoskeletal:  No aggressive appearing focal osseous lesions. CT ABDOMEN PELVIS FINDINGS Hepatobiliary: Diffuse hepatic steatosis. No definite liver surface irregularity. Normal liver size. Simple 1.0 cm anterior left liver lobe cyst. No additional liver lesions. Normal gallbladder with no radiopaque cholelithiasis. No biliary ductal dilatation. Pancreas: Normal, with no mass or duct dilation. Spleen: Splenectomy. Small left upper quadrant splenule is unchanged. Adrenals/Urinary Tract: Normal adrenals. Normal kidneys with no hydronephrosis and no renal mass. Normal bladder. Stomach/Bowel: Small hiatal hernia. Otherwise normal nondistended stomach. Normal caliber small bowel with no small bowel wall thickening. Appendix not discretely visualized. Oral contrast transits to the colon. Normal large bowel with no diverticulosis, large bowel wall thickening or pericolonic fat stranding. Vascular/Lymphatic: Atherosclerotic abdominal aorta with stable ectatic 2.5 cm infrarenal abdominal  aorta. Patent portal, hepatic and renal veins. No pathologically enlarged lymph nodes in the abdomen or pelvis. Reproductive: Status post hysterectomy, with no abnormal findings at the vaginal cuff. No adnexal mass. Other: No pneumoperitoneum, ascites or focal fluid collection. Musculoskeletal: No aggressive appearing focal osseous lesions. Moderate lumbar spondylosis. IMPRESSION: 1. No lymphadenopathy or other findings of recurrent lymphoma in the chest, abdomen or pelvis. 2. 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  Recommend follow-up aortic ultrasound in 5 years. This recommendation follows ACR consensus guidelines: White Paper of the ACR Incidental Findings Committee II on Vascular Findings. J Am Coll Radiol 2013; 10:789-794. 3. Chronic findings include: Aortic Atherosclerosis (ICD10-I70.0). Three-vessel coronary atherosclerosis. Diffuse hepatic steatosis. Small hiatal hernia. Electronically Signed   By: Ilona Sorrel M.D.   On: 11/11/2018 08:17   Ct Abdomen Pelvis W Contrast  Result Date: 11/11/2018 CLINICAL DATA:  Remote history of non-Hodgkin's lymphoma treated with chemotherapy and radiation therapy in 1987. Remote history of cervical and ovarian cancer in 1977 status posthysterectomy. Patient now presents with night sweats and itching for 6-8 months. Prior splenectomy. EXAM: CT CHEST, ABDOMEN, AND PELVIS WITH CONTRAST TECHNIQUE: Multidetector CT imaging of the chest, abdomen and pelvis was performed following the standard protocol during bolus administration of intravenous contrast. CONTRAST:  14m OMNIPAQUE IOHEXOL 300 MG/ML  SOLN COMPARISON:  11/14/2016 CT abdomen/pelvis FINDINGS: CT CHEST FINDINGS Cardiovascular: Normal heart size. No significant pericardial effusion/thickening. Three-vessel coronary atherosclerosis. Atherosclerotic nonaneurysmal thoracic aorta. Normal caliber pulmonary arteries. No central pulmonary emboli. Mediastinum/Nodes: Thyroid is either  atrophic or surgically absent. Unremarkable esophagus. No pathologically enlarged axillary, mediastinal or hilar lymph nodes. Lungs/Pleura: No pneumothorax. No pleural effusion. No acute consolidative airspace disease, lung masses or significant pulmonary nodules. There is patchy ground-glass opacity and reticulation in medial apical upper lobes bilaterally with sharp lateral margins compatible with mild radiation fibrosis. Musculoskeletal:  No aggressive appearing focal osseous lesions. CT ABDOMEN PELVIS FINDINGS Hepatobiliary: Diffuse hepatic steatosis. No definite liver surface irregularity. Normal liver size. Simple 1.0 cm anterior left liver lobe cyst. No additional liver lesions. Normal gallbladder with no radiopaque cholelithiasis. No biliary ductal dilatation. Pancreas: Normal, with no mass or duct dilation. Spleen: Splenectomy. Small left upper quadrant splenule is unchanged. Adrenals/Urinary Tract: Normal adrenals. Normal kidneys with no hydronephrosis and no renal mass. Normal bladder. Stomach/Bowel: Small hiatal hernia. Otherwise normal nondistended stomach. Normal caliber small bowel with no small bowel wall thickening. Appendix not discretely visualized. Oral contrast transits to the colon. Normal large bowel with no diverticulosis, large bowel wall thickening or pericolonic fat stranding. Vascular/Lymphatic: Atherosclerotic abdominal aorta with stable ectatic 2.5 cm infrarenal abdominal aorta. Patent portal, hepatic and renal veins. No pathologically enlarged lymph nodes in the abdomen or pelvis. Reproductive: Status post hysterectomy, with no abnormal findings at the vaginal cuff. No adnexal mass. Other: No pneumoperitoneum, ascites or focal fluid collection. Musculoskeletal: No aggressive appearing focal osseous lesions. Moderate lumbar spondylosis. IMPRESSION: 1. No lymphadenopathy or other findings of recurrent lymphoma in the chest, abdomen or pelvis. 2. 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 Recommend follow-up aortic ultrasound in 5 years. This recommendation follows ACR consensus guidelines: White Paper of the ACR Incidental Findings Committee II on Vascular Findings. J Am Coll Radiol 2013; 10:789-794. 3. Chronic findings include: Aortic Atherosclerosis (ICD10-I70.0). Three-vessel coronary atherosclerosis. Diffuse hepatic steatosis. Small hiatal hernia. Electronically Signed   By: JIlona SorrelM.D.   On: 11/11/2018 08:17    ASSESSMENT & PLAN Jeanne Sims 63y.o. female with medical history significant for ovarian cancer (age 63 s/p TAH/BSO), and Hodgkin disease (1987) who presents for a follow up visit. After an extensive MPN and lymphoma workup she has been found to have no evidence of a hematological abnormality. The cause of her night sweats is not clear at this time, however I have a strong suspicion it is medication related (possible hypoglycemia?). Alternatively hyperthyroidism can cause sweating, but usually not paroxysmal.  The etiology of her leukocytosis and thrombocytosis is likely her splenectomy. This is supported by the elevation in all WBC types. The low iron (without microcytic anemia) may be driving up the platelet count higher than her elevated baseline. Her inflammatory markers are normal which is reassuring for no cancer/infection. We will have her RTC in 3 months to assure her counts are stable and she has responded to iron therapy.   #Leukocytosis/Thrombocytosis, likely 2/2 to splenectomy --CT C/A/P showed no evidence of recurrent malignancy.   -- MPN workup for JAK2, CALR, MPL, and BCR/ABL were negative. No indication for a bone marrow biopsy at this time.  --CRP, ESR and ferritin show no evidence of inflammation that would imply infection.  --iron studies reveal a mild iron deficiency (with no anemia or microcytosis). This may be a driving factor in the thrombocytosis, so will start PO iron therapy -- cancer screenings are up  to date with colonoscopy (last this year) and mammography (last May 2020).  --no indication for a bone marrow biopsy at this time.  --RTC 3  months to reassess counts and assure iron deficiency is resolved.   #Night Sweats --workup for hematological malignancy as the etiology of her sweats was performed, with no clear etiology uncovered --hypoglycemic agents are a possible culprit for these findings, with drops in nighttime sugar resulting in sweats. --none of her medications have sweats a clear side effect, other than her hypoglycemic agents --the patient note she "runs high" on thyroid medication (evidenced by low TSH). Hyperthyroidism is another possible etiology.   #Hodgkin Lymphoma, in remission --records from [facility] not in our system --patient treated with chemotherapy and radiation --unlikely recurrence, but considered as noted above  All questions were answered. The patient knows to call the clinic with any problems, questions or concerns.  A total of more than 40 minutes were spent face-to-face with the patient during this encounter and over half of that time was spent on counseling and coordination of care as outlined above.   Ledell Peoples, MD Department of Hematology/Oncology [place] at [facility] Phone: [phone] Pager: [phone] Email: [email or web address]  11/17/2018 12:52 PM

## 2018-11-19 ENCOUNTER — Other Ambulatory Visit: Payer: Self-pay | Admitting: Hematology and Oncology

## 2018-11-19 MED ORDER — FERROUS SULFATE 325 (65 FE) MG PO TABS: 325.0000 mg | ORAL_TABLET | Freq: Every day | ORAL | 3 refills | Status: DC

## 2019-01-06 ENCOUNTER — Telehealth: Payer: Self-pay | Admitting: Hematology and Oncology

## 2019-01-06 NOTE — Telephone Encounter (Signed)
Scheduled per 11/11 los. Called and left msg. Mailing printout

## 2019-01-11 ENCOUNTER — Telehealth: Payer: Self-pay | Admitting: Hematology and Oncology

## 2019-01-11 NOTE — Telephone Encounter (Signed)
Returned patient's phone call regarding cancelling February appointments, spoke with patient's husband and patient will give a call when ready to reschedule.

## 2019-02-07 DIAGNOSIS — I639 Cerebral infarction, unspecified: Secondary | ICD-10-CM

## 2019-02-07 HISTORY — DX: Cerebral infarction, unspecified: I63.9

## 2019-02-17 ENCOUNTER — Other Ambulatory Visit: Payer: Commercial Managed Care - PPO

## 2019-02-17 ENCOUNTER — Ambulatory Visit: Payer: Commercial Managed Care - PPO | Admitting: Hematology and Oncology

## 2019-02-20 ENCOUNTER — Emergency Department (HOSPITAL_BASED_OUTPATIENT_CLINIC_OR_DEPARTMENT_OTHER): Payer: Commercial Managed Care - PPO

## 2019-02-20 ENCOUNTER — Inpatient Hospital Stay (HOSPITAL_BASED_OUTPATIENT_CLINIC_OR_DEPARTMENT_OTHER)
Admission: EM | Admit: 2019-02-20 | Discharge: 2019-02-24 | DRG: 175 | Disposition: A | Discharge status: Home / Self Care | Payer: Commercial Managed Care - PPO | Attending: Internal Medicine | Admitting: Internal Medicine

## 2019-02-20 ENCOUNTER — Other Ambulatory Visit: Payer: Self-pay

## 2019-02-20 ENCOUNTER — Encounter (HOSPITAL_BASED_OUTPATIENT_CLINIC_OR_DEPARTMENT_OTHER): Payer: Self-pay | Admitting: Emergency Medicine

## 2019-02-20 DIAGNOSIS — Z85828 Personal history of other malignant neoplasm of skin: Secondary | ICD-10-CM

## 2019-02-20 DIAGNOSIS — E669 Obesity, unspecified: Secondary | ICD-10-CM | POA: Diagnosis present

## 2019-02-20 DIAGNOSIS — R0602 Shortness of breath: Secondary | ICD-10-CM

## 2019-02-20 DIAGNOSIS — I639 Cerebral infarction, unspecified: Secondary | ICD-10-CM | POA: Diagnosis not present

## 2019-02-20 DIAGNOSIS — Z79899 Other long term (current) drug therapy: Secondary | ICD-10-CM

## 2019-02-20 DIAGNOSIS — R29705 NIHSS score 5: Secondary | ICD-10-CM | POA: Diagnosis not present

## 2019-02-20 DIAGNOSIS — Z833 Family history of diabetes mellitus: Secondary | ICD-10-CM

## 2019-02-20 DIAGNOSIS — Z90710 Acquired absence of both cervix and uterus: Secondary | ICD-10-CM | POA: Diagnosis not present

## 2019-02-20 DIAGNOSIS — Z803 Family history of malignant neoplasm of breast: Secondary | ICD-10-CM

## 2019-02-20 DIAGNOSIS — I6381 Other cerebral infarction due to occlusion or stenosis of small artery: Secondary | ICD-10-CM | POA: Diagnosis not present

## 2019-02-20 DIAGNOSIS — J9600 Acute respiratory failure, unspecified whether with hypoxia or hypercapnia: Secondary | ICD-10-CM | POA: Diagnosis present

## 2019-02-20 DIAGNOSIS — E1165 Type 2 diabetes mellitus with hyperglycemia: Secondary | ICD-10-CM | POA: Diagnosis present

## 2019-02-20 DIAGNOSIS — D72828 Other elevated white blood cell count: Secondary | ICD-10-CM | POA: Diagnosis present

## 2019-02-20 DIAGNOSIS — I2699 Other pulmonary embolism without acute cor pulmonale: Principal | ICD-10-CM | POA: Diagnosis present

## 2019-02-20 DIAGNOSIS — H9193 Unspecified hearing loss, bilateral: Secondary | ICD-10-CM | POA: Diagnosis present

## 2019-02-20 DIAGNOSIS — Z8719 Personal history of other diseases of the digestive system: Secondary | ICD-10-CM

## 2019-02-20 DIAGNOSIS — E039 Hypothyroidism, unspecified: Secondary | ICD-10-CM | POA: Diagnosis present

## 2019-02-20 DIAGNOSIS — U071 COVID-19: Secondary | ICD-10-CM | POA: Diagnosis present

## 2019-02-20 DIAGNOSIS — I341 Nonrheumatic mitral (valve) prolapse: Secondary | ICD-10-CM | POA: Diagnosis present

## 2019-02-20 DIAGNOSIS — I700 Atherosclerosis of aorta: Secondary | ICD-10-CM | POA: Diagnosis present

## 2019-02-20 DIAGNOSIS — Z90722 Acquired absence of ovaries, bilateral: Secondary | ICD-10-CM

## 2019-02-20 DIAGNOSIS — Z9079 Acquired absence of other genital organ(s): Secondary | ICD-10-CM | POA: Diagnosis not present

## 2019-02-20 DIAGNOSIS — Z6830 Body mass index (BMI) 30.0-30.9, adult: Secondary | ICD-10-CM

## 2019-02-20 DIAGNOSIS — E785 Hyperlipidemia, unspecified: Secondary | ICD-10-CM | POA: Diagnosis present

## 2019-02-20 DIAGNOSIS — Z9621 Cochlear implant status: Secondary | ICD-10-CM | POA: Diagnosis present

## 2019-02-20 DIAGNOSIS — Z9049 Acquired absence of other specified parts of digestive tract: Secondary | ICD-10-CM

## 2019-02-20 DIAGNOSIS — Z7984 Long term (current) use of oral hypoglycemic drugs: Secondary | ICD-10-CM

## 2019-02-20 DIAGNOSIS — Z23 Encounter for immunization: Secondary | ICD-10-CM

## 2019-02-20 DIAGNOSIS — Z8571 Personal history of Hodgkin lymphoma: Secondary | ICD-10-CM

## 2019-02-20 DIAGNOSIS — R29810 Facial weakness: Secondary | ICD-10-CM | POA: Diagnosis not present

## 2019-02-20 DIAGNOSIS — Z0000 Encounter for general adult medical examination without abnormal findings: Secondary | ICD-10-CM | POA: Insufficient documentation

## 2019-02-20 DIAGNOSIS — Z8616 Personal history of COVID-19: Secondary | ICD-10-CM

## 2019-02-20 DIAGNOSIS — I361 Nonrheumatic tricuspid (valve) insufficiency: Secondary | ICD-10-CM | POA: Diagnosis not present

## 2019-02-20 DIAGNOSIS — Z8541 Personal history of malignant neoplasm of cervix uteri: Secondary | ICD-10-CM

## 2019-02-20 DIAGNOSIS — D473 Essential (hemorrhagic) thrombocythemia: Secondary | ICD-10-CM | POA: Diagnosis present

## 2019-02-20 DIAGNOSIS — Z9081 Acquired absence of spleen: Secondary | ICD-10-CM | POA: Diagnosis not present

## 2019-02-20 DIAGNOSIS — T385X5A Adverse effect of other estrogens and progestogens, initial encounter: Secondary | ICD-10-CM | POA: Diagnosis present

## 2019-02-20 DIAGNOSIS — Z8249 Family history of ischemic heart disease and other diseases of the circulatory system: Secondary | ICD-10-CM

## 2019-02-20 DIAGNOSIS — I350 Nonrheumatic aortic (valve) stenosis: Secondary | ICD-10-CM | POA: Diagnosis not present

## 2019-02-20 DIAGNOSIS — G8191 Hemiplegia, unspecified affecting right dominant side: Secondary | ICD-10-CM | POA: Diagnosis not present

## 2019-02-20 DIAGNOSIS — Z923 Personal history of irradiation: Secondary | ICD-10-CM

## 2019-02-20 DIAGNOSIS — I351 Nonrheumatic aortic (valve) insufficiency: Secondary | ICD-10-CM | POA: Diagnosis not present

## 2019-02-20 DIAGNOSIS — Z79890 Hormone replacement therapy (postmenopausal): Secondary | ICD-10-CM

## 2019-02-20 DIAGNOSIS — Z7982 Long term (current) use of aspirin: Secondary | ICD-10-CM

## 2019-02-20 DIAGNOSIS — Z8543 Personal history of malignant neoplasm of ovary: Secondary | ICD-10-CM

## 2019-02-20 DIAGNOSIS — Z8049 Family history of malignant neoplasm of other genital organs: Secondary | ICD-10-CM

## 2019-02-20 LAB — COMPREHENSIVE METABOLIC PANEL
ALT: 18 U/L (ref 0–44)
AST: 16 U/L (ref 15–41)
Albumin: 3.5 g/dL (ref 3.5–5.0)
Alkaline Phosphatase: 58 U/L (ref 38–126)
Anion gap: 9 (ref 5–15)
BUN: 12 mg/dL (ref 8–23)
CO2: 23 mmol/L (ref 22–32)
Calcium: 8.9 mg/dL (ref 8.9–10.3)
Chloride: 105 mmol/L (ref 98–111)
Creatinine, Ser: 0.56 mg/dL (ref 0.44–1.00)
GFR calc Af Amer: >60 mL/min (ref >60)
GFR calc non Af Amer: >60 mL/min (ref >60)
Glucose, Bld: 151 mg/dL — ABNORMAL HIGH (ref 70–99)
Potassium: 3.9 mmol/L (ref 3.5–5.1)
Sodium: 137 mmol/L (ref 135–145)
Total Bilirubin: 0.7 mg/dL (ref 0.3–1.2)
Total Protein: 7 g/dL (ref 6.5–8.1)

## 2019-02-20 LAB — CBC WITH DIFFERENTIAL/PLATELET
Abs Immature Granulocytes: 0.16 10*3/uL — ABNORMAL HIGH (ref 0.00–0.07)
Basophils Absolute: 0.2 10*3/uL — ABNORMAL HIGH (ref 0.0–0.1)
Basophils Relative: 1 %
Eosinophils Absolute: 0.2 10*3/uL (ref 0.0–0.5)
Eosinophils Relative: 1 %
HCT: 43.5 % (ref 36.0–46.0)
Hemoglobin: 14.3 g/dL (ref 12.0–15.0)
Immature Granulocytes: 1 %
Lymphocytes Relative: 25 %
Lymphs Abs: 5.8 10*3/uL — ABNORMAL HIGH (ref 0.7–4.0)
MCH: 31.3 pg (ref 26.0–34.0)
MCHC: 32.9 g/dL (ref 30.0–36.0)
MCV: 95.2 fL (ref 80.0–100.0)
Monocytes Absolute: 2.5 10*3/uL — ABNORMAL HIGH (ref 0.1–1.0)
Monocytes Relative: 11 %
Neutro Abs: 14.3 10*3/uL — ABNORMAL HIGH (ref 1.7–7.7)
Neutrophils Relative %: 61 %
Platelets: 415 10*3/uL — ABNORMAL HIGH (ref 150–400)
RBC: 4.57 MIL/uL (ref 3.87–5.11)
RDW: 13.5 % (ref 11.5–15.5)
WBC Morphology: ABNORMAL
WBC: 23.2 10*3/uL — ABNORMAL HIGH (ref 4.0–10.5)
nRBC: 0 % (ref 0.0–0.2)

## 2019-02-20 LAB — RESPIRATORY PANEL BY RT PCR (FLU A&B, COVID)
Influenza A by PCR: NEGATIVE
Influenza B by PCR: NEGATIVE
SARS Coronavirus 2 by RT PCR: POSITIVE — AB

## 2019-02-20 LAB — TROPONIN I (HIGH SENSITIVITY)
Troponin I (High Sensitivity): 3 ng/L (ref <18)
Troponin I (High Sensitivity): 3 ng/L (ref <18)

## 2019-02-20 LAB — HEPARIN LEVEL (UNFRACTIONATED): Heparin Unfractionated: 0.9 IU/mL — ABNORMAL HIGH (ref 0.30–0.70)

## 2019-02-20 LAB — LACTIC ACID, PLASMA
Lactic Acid, Venous: 2.7 mmol/L (ref 0.5–1.9)
Lactic Acid, Venous: 1.6 mmol/L (ref 0.5–1.9)

## 2019-02-20 LAB — LIPASE, BLOOD: Lipase: 19 U/L (ref 11–51)

## 2019-02-20 MED ORDER — HEPARIN BOLUS VIA INFUSION
5400.0000 [IU] | Freq: Once | INTRAVENOUS | Status: AC
  Administered 2019-02-20: 16:00:00 5400 [IU] via INTRAVENOUS

## 2019-02-20 MED ORDER — MORPHINE SULFATE (PF) 4 MG/ML IV SOLN
4.0000 mg | Freq: Once | INTRAVENOUS | Status: AC
  Administered 2019-02-20: 4 mg via INTRAVENOUS
  Filled 2019-02-20: qty 1

## 2019-02-20 MED ORDER — ONDANSETRON HCL 4 MG/2ML IJ SOLN
4.0000 mg | Freq: Once | INTRAMUSCULAR | Status: AC
  Administered 2019-02-20: 4 mg via INTRAVENOUS
  Filled 2019-02-20: qty 2

## 2019-02-20 MED ORDER — SODIUM CHLORIDE 0.9 % IV SOLN: INTRAVENOUS | Status: DC | PRN

## 2019-02-20 MED ORDER — HEPARIN (PORCINE) 25000 UT/250ML-% IV SOLN
1350.0000 [IU]/h | INTRAVENOUS | Status: AC
  Administered 2019-02-20: 16:00:00 1400 [IU]/h via INTRAVENOUS
  Administered 2019-02-21: 09:00:00 1200 [IU]/h via INTRAVENOUS
  Administered 2019-02-22: 09:00:00 1350 [IU]/h via INTRAVENOUS
  Filled 2019-02-20 (×3): qty 250

## 2019-02-20 MED ORDER — MORPHINE SULFATE (PF) 4 MG/ML IV SOLN
4.0000 mg | Freq: Once | INTRAVENOUS | Status: AC
  Administered 2019-02-20: 17:00:00 4 mg via INTRAVENOUS
  Filled 2019-02-20: qty 1

## 2019-02-20 MED ORDER — ACETAMINOPHEN 500 MG PO TABS
1000.0000 mg | ORAL_TABLET | Freq: Once | ORAL | Status: AC
  Administered 2019-02-20: 1000 mg via ORAL
  Filled 2019-02-20: qty 2

## 2019-02-20 MED ORDER — IOHEXOL 350 MG/ML SOLN
100.0000 mL | Freq: Once | INTRAVENOUS | Status: AC | PRN
  Administered 2019-02-20: 71 mL via INTRAVENOUS

## 2019-02-20 MED ORDER — ONDANSETRON HCL 4 MG/2ML IJ SOLN
4.0000 mg | Freq: Once | INTRAMUSCULAR | Status: DC
  Filled 2019-02-20: qty 2

## 2019-02-20 NOTE — ED Provider Notes (Signed)
[facility] EMERGENCY DEPARTMENT Provider Note   CSN: [id number] Arrival date & time: 02/20/19  1101     History No chief complaint on file.   Jeanne Sims is a 64 y.o. female.  The history is provided by the patient and medical records. No language interpreter was used.  Chest Pain Pain location:  L chest, substernal area and L lateral chest Pain quality: aching and pressure   Pain radiates to:  L shoulder Pain severity:  Severe Onset quality:  Sudden Duration:  2 days Timing:  Constant Progression:  Worsening Chronicity:  New Context: breathing   Relieved by:  Nothing Worsened by:  Deep breathing and exertion Ineffective treatments:  None tried Associated symptoms: nausea, shortness of breath and vomiting   Associated symptoms: no abdominal pain, no altered mental status, no back pain, no claudication, no cough, no diaphoresis, no dizziness, no fatigue, no fever, no headache, no near-syncope, no numbness and no palpitations   Risk factors: not female and no prior DVT/PE        Past Medical History:  Diagnosis Date  . Acquired deafness   . Cervical cancer ([place]) 1075  . Cervical cancer ([place])   . Diabetes mellitus without complication ([place])   . Dyspareunia   . Heart murmur   . Hodgkin disease ([place]) 1987  . Ovarian cancer [facility])    age 58  . Thyroid disease     There are no problems to display for this patient.   Past Surgical History:  Procedure Laterality Date  . ABDOMINAL HYSTERECTOMY     TAH/BSO at age 47 d/t cervical CA  . BASAL CELL CARCINOMA EXCISION     face  . BOWEL RESECTION     followed splenectomy.  had bowel obstruction.  . COCHLEAR IMPLANT  1983   Dr. Thornell Mule  . SPLENECTOMY       OB History   No obstetric history on file.     Family History  Problem Relation Age of Onset  . Heart failure Mother   . Diabetes Mother   . Diabetes Brother   . Breast cancer Paternal Uncle   . Diabetes Sister   . Cervical cancer Sister   .  Diabetes Sister   . Cervical cancer Sister   . Diabetes Sister   . Diabetes Brother     Social History   Tobacco Use  . Smoking status: Never Smoker  . Smokeless tobacco: Never Used  Substance Use Topics  . Alcohol use: No  . Drug use: No    Home Medications Prior to Admission medications   Medication Sig Start Date End Date Taking? Authorizing Provider  aspirin 81 MG chewable tablet Chew 81 mg by mouth daily.    [provider]  Cinnamon 500 MG capsule Take 2,000 mg by mouth 2 (two) times daily.    [provider]  Cyanocobalamin 1000 MCG/ML KIT Inject as directed every 30 (thirty) days.    [provider]  dexlansoprazole (DEXILANT) 60 MG capsule Take 60 mg by mouth daily.    [provider]  Dulaglutide (TRULICITY) 1.5 JS/2.8BT SOPN Inject into the skin once a week.    [provider]  estradiol (CLIMARA - DOSED IN MG/24 HR) 0.025 mg/24hr patch Place 0.025 mg onto the skin once a week.    [provider]  ferrous sulfate 325 (65 FE) MG tablet Take 1 tablet (325 mg total) by mouth daily with breakfast. Please take with a source of vitamin  C (orange juice or tablet with 500 units of Vitamin C). 11/19/18   Orson Slick, MD  folic acid (FOLVITE) 970 MCG tablet Take 800 mcg by mouth daily.    [provider]  levothyroxine (EUTHYROX) 175 MCG tablet Take 175 mcg by mouth daily before breakfast.    [provider]  linaclotide (LINZESS) 290 MCG CAPS capsule Take 290 mcg by mouth daily before breakfast.    [provider]  metFORMIN (GLUCOPHAGE) 500 MG tablet Take 500 mg by mouth 2 (two) times daily with a meal.    [provider]  metoprolol succinate (TOPROL-XL) 25 MG 24 hr tablet Take 25 mg by mouth daily.    [provider]  rosuvastatin (CRESTOR) 10 MG tablet Take 10 mg by mouth daily.    [provider]  Vitamin D, Ergocalciferol, (DRISDOL) 1.25 MG (50000 UT) CAPS capsule  Take 50,000 Units by mouth every 7 (seven) days.    [provider]    Allergies    Patient has no known allergies.  Review of Systems   Review of Systems  Constitutional: Positive for chills. Negative for diaphoresis, fatigue and fever.  HENT: Negative for congestion.   Eyes: Negative for visual disturbance.  Respiratory: Positive for chest tightness and shortness of breath. Negative for cough, wheezing and stridor.   Cardiovascular: Positive for chest pain. Negative for palpitations, claudication, leg swelling and near-syncope.  Gastrointestinal: Positive for diarrhea, nausea and vomiting. Negative for abdominal pain and constipation.  Genitourinary: Negative for dysuria.  Musculoskeletal: Negative for back pain, neck pain and neck stiffness.  Skin: Negative for rash and wound.  Neurological: Negative for dizziness, light-headedness, numbness and headaches.  Psychiatric/Behavioral: Negative for agitation.  All other systems reviewed and are negative.   Physical Exam Updated Vital Signs BP (!) 124/55   Pulse 82   Temp 98 F (36.7 C) (Oral)   Resp 20   Ht 5' 6"  (1.676 m)   Wt 86.6 kg   SpO2 100%   BMI 30.83 kg/m   Physical Exam Vitals and nursing note reviewed.  Constitutional:      General: She is not in acute distress.    Appearance: She is well-developed. She is not ill-appearing, toxic-appearing or diaphoretic.  HENT:     Head: Normocephalic and atraumatic.  Eyes:     Conjunctiva/sclera: Conjunctivae normal.     Pupils: Pupils are equal, round, and reactive to light.  Cardiovascular:     Rate and Rhythm: Normal rate and regular rhythm.     Heart sounds: Murmur present.  Pulmonary:     Effort: Pulmonary effort is normal. Tachypnea present. No respiratory distress.     Breath sounds: Normal breath sounds. No decreased breath sounds, wheezing, rhonchi or rales.  Chest:     Chest wall: Tenderness present.  Abdominal:     Palpations: Abdomen is soft.      Tenderness: There is no abdominal tenderness.  Musculoskeletal:     Cervical back: Neck supple.     Right lower leg: No tenderness. No edema.     Left lower leg: No tenderness. No edema.  Skin:    General: Skin is warm and dry.     Capillary Refill: Capillary refill takes less than 2 seconds.     Findings: No rash.  Neurological:     General: No focal deficit present.     Mental Status: She is alert.  Psychiatric:        Mood and Affect:  Mood normal.     ED Results / Procedures / Treatments   Labs (all labs ordered are listed, but only abnormal results are displayed) Labs Reviewed  CBC WITH DIFFERENTIAL/PLATELET - Abnormal; Notable for the following components:      Result Value   WBC 23.2 (*)    Platelets 415 (*)    Neutro Abs 14.3 (*)    Lymphs Abs 5.8 (*)    Monocytes Absolute 2.5 (*)    Basophils Absolute 0.2 (*)    Abs Immature Granulocytes 0.16 (*)    All other components within normal limits  LACTIC ACID, PLASMA - Abnormal; Notable for the following components:   Lactic Acid, Venous 2.7 (*)    All other components within normal limits  COMPREHENSIVE METABOLIC PANEL - Abnormal; Notable for the following components:   Glucose, Bld 151 (*)    All other components within normal limits  RESPIRATORY PANEL BY RT PCR (FLU A&B, COVID)  LACTIC ACID, PLASMA  LIPASE, BLOOD  TROPONIN I (HIGH SENSITIVITY)  TROPONIN I (HIGH SENSITIVITY)    EKG EKG Interpretation  Date/Time:  Sunday February 20 2019 11:14:49 EST Ventricular Rate:  89 PR Interval:    QRS Duration: 86 QT Interval:  373 QTC Calculation: 454 R Axis:   70 Text Interpretation: Sinus rhythm No prior ECG for comparison. No sTEMI Confirmed by Antony Blackbird [phone]) on 02/20/2019 11:23:53 AM   Radiology CT Angio Chest PE W and/or Wo Contrast  Result Date: 02/20/2019 CLINICAL DATA:  Pleuritic chest pain with shortness of breath and tachypnea. Current COVID infection. New right leg pain. History of non-Hodgkin's  lymphoma, cervical cancer, ovarian cancer. EXAM: CT ANGIOGRAPHY CHEST WITH CONTRAST TECHNIQUE: Multidetector CT imaging of the chest was performed using the standard protocol during bolus administration of intravenous contrast. Multiplanar CT image reconstructions and MIPs were obtained to evaluate the vascular anatomy. CONTRAST:  100m OMNIPAQUE IOHEXOL 350 MG/ML SOLN COMPARISON:  CT chest 11/10/2018. FINDINGS: Cardiovascular: Filling defect is identified in the right middle lobe medial and lateral segmental pulmonary arteries, in the anterior basal and medial basal right lower lobe segmental pulmonary arteries, and in a subsegmental pulmonary artery in the left lower lobe (image 207/6). No lobar are larger thrombus is identified, and as result, this case does not qualify for submassive pulmonary embolus, which necessitates lobar or larger thrombus based on our current protocols. Coronary, aortic arch, and branch vessel atherosclerotic vascular disease. Overall heart size within normal limits. 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 Although the patient has a current SARS-CoV-2 infection, we do not demonstrate the typical patchy ground-glass opacities of active COVID pneumonia. Trace left pleural effusion. Upper Abdomen: Stable 0.9 cm hypodense lesion in segment 4 of the liver on image 91/4, previously characterized as a cyst. 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 Review of the MIP images confirms the above findings. IMPRESSION: 1. Acute segmental pulmonary emboli in the right middle lobe and right lower. Acute subsegmental embolus in the left lower lobe. Lobe overall clot burden is small. No lobar or larger thrombus is  identified, accordingly this does not qualify as submassive pulmonary embolus based on current protocols. 2. Trace left pleural effusion. 3. Coronary, aortic arch, and branch vessel atherosclerotic vascular disease. Aortic Atherosclerosis (ICD10-I70.0). 4. 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 No lung opacities to suggest active pneumonia related to the patient's current SARS-CoV-2 infection. 5. 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 6. Stable hypodense lesion in segment 4 of the liver, previously characterized as a cyst. Critical Value/emergent results were called by telephone at the time of interpretation on 02/20/2019 at 2:05 pm to provider Dr. Marda Stalker , who verbally acknowledged these results. Electronically Signed   By: Van Clines M.D.   On: 02/20/2019 14:28   US Venous Img Lower Unilateral Right  Result Date: 02/20/2019 CLINICAL DATA:  Right calf cramping. EXAM: RIGHT LOWER EXTREMITY VENOUS DOPPLER ULTRASOUND TECHNIQUE: Gray-scale sonography with graded compression, as well as color Doppler and duplex ultrasound were performed to evaluate the lower extremity deep venous systems from the level of the common femoral vein and including the common femoral, femoral, profunda femoral, popliteal and calf veins including the posterior tibial, peroneal and gastrocnemius veins when visible. The superficial great saphenous vein was also interrogated. Spectral Doppler was utilized to evaluate flow at rest and with distal augmentation maneuvers in the common femoral, femoral and popliteal veins. COMPARISON:  None. FINDINGS: Contralateral Common Femoral Vein: Respiratory phasicity is normal and symmetric with the symptomatic side. No evidence of thrombus. Normal compressibility. Common Femoral Vein: No evidence of thrombus. Normal  compressibility, respiratory phasicity and response to augmentation. Saphenofemoral Junction: No evidence of thrombus. Normal compressibility and flow on color Doppler imaging. Profunda Femoral Vein: No evidence of thrombus. Normal compressibility and flow on color Doppler imaging. Femoral Vein: No evidence of thrombus. Normal compressibility, respiratory phasicity and response to augmentation. Popliteal Vein: No evidence of thrombus. Normal compressibility, respiratory phasicity and response to augmentation. Calf Veins: No evidence of thrombus. Normal compressibility and flow on color Doppler imaging. Superficial Great Saphenous Vein: No evidence of thrombus. Normal compressibility. Venous Reflux:  None. Other Findings: No evidence of superficial thrombophlebitis or abnormal fluid collection. IMPRESSION: No evidence of right lower extremity deep venous thrombosis. Electronically Signed   By: Aletta Edouard M.D.   On: 02/20/2019 13:21    Procedures Procedures (including critical care time)  Nattie L Batie was evaluated in Emergency Department on 02/20/2019 for the symptoms described in the history of present illness. She was evaluated in the context of the global COVID-19 pandemic, which necessitated consideration that the patient might be at risk for infection with the SARS-CoV-2 virus that causes COVID-19. Institutional protocols and algorithms that pertain to the evaluation of patients at risk for COVID-19 are in a state of rapid change based on information released by regulatory bodies including the CDC and federal and state organizations. These policies and algorithms were followed during the patient's care in the ED.  CRITICAL CARE Performed by: Gwenyth Allegra Carmine Carrozza Total critical care time: 35 minutes Critical care time was exclusive of separately billable procedures and treating other patients. Critical care was necessary to treat or prevent imminent or life-threatening deterioration. Critical  care was time spent personally by me on the following activities: development of treatment plan with patient and/or surrogate as well as nursing, discussions with consultants, evaluation of patient's response to treatment, examination of patient, obtaining history from patient or surrogate, ordering and performing treatments and interventions, ordering and review of laboratory studies, ordering and review of radiographic studies, pulse oximetry and re-evaluation of patient's condition.  Medications Ordered in ED Medications  iohexol (OMNIPAQUE) 350  MG/ML injection 100 mL (71 mLs Intravenous Contrast Given 02/20/19 1317)    ED Course  I have reviewed the triage vital signs and the nursing notes.  Pertinent labs & imaging results that were available during my care of the patient were reviewed by me and considered in my medical decision making (see chart for details).    MDM Rules/Calculators/A&P                      Veleka L Teresi is a 64 y.o. 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  She reports that last night she began having shortness of breath and left-sided chest discomfort radiating into her left shoulder.  She is never had this discomfort before.  She reports it is severe, 8 out of 10 in severity and has been constant since it began yesterday.  She reports no trauma.  She reports associated shortness of breath and the pain is extremely pleuritic and exertional.  She reports no significant headache, neck pain or neck stiffness.  She does report some continued chills on and off since Covid as well as intermittent nausea, vomiting, diarrhea ever since diagnosis several weeks ago.  She  reports her chest discomfort and shortness of breath was worse this morning prompting her to seek evaluation.  She denies any history of DVT or PE but does report that she is having right leg pain in her calf that is new for the last few days.  She denies new edema.  She denies any abdominal or flank symptoms.  On exam, lungs are clear.  Patient does have some tenderness in her left lateral chest and inferior chest.  No abdominal tenderness.  Normal bowel sounds.  Murmur was appreciated.  Good pulses in all extremities.  No significant tenderness in her right calf but this is where she is having the pain.  Good sensation strength and pulse in lower extremities.  Patient was very tachypneic when she came back to the exam room but is maintaining her oxygen saturations on room air.  Clinically I am concerned we need to rule out a thromboembolic etiology of her pleuritic and exertional chest discomfort and shortness of breath even her new Covid diagnosis and her cancer history.  She will get an ultrasound of the leg as well as a CT PE study.  We will get screening labs otherwise to look for other etiologies including a cardiac cause.  Her EKG did not show STEMI.  Anticipate reassessment after work-up.  2:48 PM Patient's CT scan confirms bilateral pulmonary emboli.  Given the patient's near respiratory distress with tachypnea on arrival and her continued shortness of breath despite not having hypoxia, I do not feel safe with her going home.  I could not find her positive Covid test in the chart so we will retest but she reports it was only 2 weeks ago she was testing positive.  Suspect there are still component of Covid to her PE.  Her troponins were negative and she is not septic with improving lactic acids.  CT scan did not show pneumonia.  Ultrasound not show DVT in her legs.  We will start heparin and will medication for PE causing severe exertional tachypnea.   Hospitalist team  called for admission and  will admit patient.   Final Clinical Impression(s) / ED Diagnoses Final diagnoses:  Shortness of breath  Acute pulmonary embolism without acute cor pulmonale, unspecified pulmonary embolism type (HCC)    Clinical Impression: 1. Shortness of breath   2. Acute pulmonary embolism without acute cor pulmonale, unspecified pulmonary embolism type ([place])     Disposition: Admit  This note was prepared with assistance of Dragon voice recognition software. Occasional wrong-word or sound-a-like substitutions may have occurred due to the inherent limitations of voice recognition software.      Jeronica Stlouis, Gwenyth Allegra, MD 02/20/19 [id number]

## 2019-02-20 NOTE — Progress Notes (Signed)
ANTICOAGULATION CONSULT NOTE - Initial Consult  Pharmacy Consult for Heparin Indication: pulmonary embolus  No Known Allergies  Patient Measurements: Height: 5\' 6"  (167.6 cm) Weight: 191 lb (86.6 kg) IBW/kg (Calculated) : 59.3 Heparin Dosing Weight: 77.9 kg  Vital Signs: Temp: 98 F (36.7 C) (02/14 1120) Temp Source: Oral (02/14 1120) BP: 141/59 (02/14 1500) Pulse Rate: 85 (02/14 1500)  Labs: Recent Labs    02/20/19 1113 02/20/19 1217 02/20/19 1350  HGB 14.3  --   --   HCT 43.5  --   --   PLT 415*  --   --   CREATININE  --  0.56  --   TROPONINIHS 3  --  3    Estimated Creatinine Clearance: 79.8 mL/min (by C-G formula based on SCr of 0.56 mg/dL).   Medical History: Past Medical History:  Diagnosis Date  . Acquired deafness   . Cervical cancer ([place]) 1075  . Cervical cancer ([place])   . Diabetes mellitus without complication ([place])   . Dyspareunia   . Heart murmur   . Hodgkin disease ([place]) 1987  . Ovarian cancer [facility])    age 64  . Thyroid disease     Medications:  Scheduled:  . heparin  5,400 Units Intravenous Once    Assessment: Patient is a 64 yof that presented to the ED with chest pain. The patient does have a hx of cancer and recent COVID diagnosis. The patient was fount to have a PE and Pharmacy has been asked to dose heparin.   Goal of Therapy:  Heparin level 0.3-0.7 units/ml Monitor platelets by anticoagulation protocol: Yes   Plan:  - Heparin 5400n units IV x 1 dose  - Heparin drip @ 1400 units/hr  - Heparin level in ~6 hours  - Monitor patient for s/s of bleeding and cbc while on heparin    Duanne Limerick PharmD. BCPS  02/20/2019,3:05 PM

## 2019-02-20 NOTE — Progress Notes (Signed)
Was tested positive for COVID about 2 and half weeks ago, never treated. Came with new onset PE, but DVT negative. Exam showing significant tachypnea, but O2 sat maintained. Heparin drip started in ED. Admit to [facility] for further management. COVID test sent and pending.

## 2019-02-20 NOTE — ED Notes (Signed)
Pt daughter [name] Cobb updated. [phone]

## 2019-02-20 NOTE — Progress Notes (Signed)
ANTICOAGULATION CONSULT NOTE  Pharmacy Consult for Heparin Indication: pulmonary embolus  No Known Allergies  Patient Measurements: Height: 5\' 6"  (167.6 cm) Weight: 191 lb (86.6 kg) IBW/kg (Calculated) : 59.3 Heparin Dosing Weight: 77.9 kg  Vital Signs: Temp: 98 F (36.7 C) (02/14 1120) Temp Source: Oral (02/14 1120) BP: 120/51 (02/14 2200) Pulse Rate: 81 (02/14 2200)  Labs: Recent Labs    02/20/19 1113 02/20/19 1217 02/20/19 1350 02/20/19 2106  HGB 14.3  --   --   --   HCT 43.5  --   --   --   PLT 415*  --   --   --   HEPARINUNFRC  --   --   --  0.90*  CREATININE  --  0.56  --   --   TROPONINIHS 3  --  3  --     Estimated Creatinine Clearance: 79.8 mL/min (by C-G formula based on SCr of 0.56 mg/dL).   Medical History: Past Medical History:  Diagnosis Date  . Acquired deafness   . Cervical cancer ([place]) 1075  . Cervical cancer ([place])   . Diabetes mellitus without complication ([place])   . Dyspareunia   . Heart murmur   . Hodgkin disease ([place]) 1987  . Ovarian cancer [facility])    age 8  . Thyroid disease     Medications:  Scheduled:  . ondansetron (ZOFRAN) IV  4 mg Intravenous Once    Assessment: Patient is a 64 yof that presented to the ED with chest pain. The patient does have a hx of cancer and recent COVID diagnosis. The patient was fount to have a PE and Pharmacy has been asked to dose heparin.   Initial heparin level is elevated at 0.9  Goal of Therapy:  Heparin level 0.3-0.7 units/ml Monitor platelets by anticoagulation protocol: Yes   Plan:  -Reduce heparin to 1200 units/h -Recheck heparin level in [place], PharmD, BCPS Clinical Pharmacist [phone] Please check AMION for all [place] numbers 02/20/2019

## 2019-02-20 NOTE — ED Triage Notes (Signed)
COVID+, c/o SOB and chest pain since yesterday.

## 2019-02-20 NOTE — ED Notes (Signed)
Lactic Acid 2.7, results given to ED MD and RN

## 2019-02-21 ENCOUNTER — Encounter (HOSPITAL_COMMUNITY): Payer: Self-pay | Admitting: Internal Medicine

## 2019-02-21 ENCOUNTER — Inpatient Hospital Stay (HOSPITAL_COMMUNITY): Payer: Commercial Managed Care - PPO

## 2019-02-21 DIAGNOSIS — E1165 Type 2 diabetes mellitus with hyperglycemia: Secondary | ICD-10-CM | POA: Diagnosis present

## 2019-02-21 DIAGNOSIS — I361 Nonrheumatic tricuspid (valve) insufficiency: Secondary | ICD-10-CM

## 2019-02-21 DIAGNOSIS — I351 Nonrheumatic aortic (valve) insufficiency: Secondary | ICD-10-CM

## 2019-02-21 DIAGNOSIS — E039 Hypothyroidism, unspecified: Secondary | ICD-10-CM | POA: Diagnosis present

## 2019-02-21 DIAGNOSIS — I350 Nonrheumatic aortic (valve) stenosis: Secondary | ICD-10-CM

## 2019-02-21 DIAGNOSIS — I2699 Other pulmonary embolism without acute cor pulmonale: Secondary | ICD-10-CM | POA: Diagnosis present

## 2019-02-21 DIAGNOSIS — E785 Hyperlipidemia, unspecified: Secondary | ICD-10-CM | POA: Diagnosis present

## 2019-02-21 DIAGNOSIS — U071 COVID-19: Secondary | ICD-10-CM | POA: Diagnosis present

## 2019-02-21 LAB — GLUCOSE, CAPILLARY
Glucose-Capillary: 111 mg/dL — ABNORMAL HIGH (ref 70–99)
Glucose-Capillary: 126 mg/dL — ABNORMAL HIGH (ref 70–99)

## 2019-02-21 LAB — ECHOCARDIOGRAM LIMITED
Height: 66 in
Weight: 3056 oz

## 2019-02-21 LAB — HEPARIN LEVEL (UNFRACTIONATED)
Heparin Unfractionated: 0.45 IU/mL (ref 0.30–0.70)
Heparin Unfractionated: 0.37 IU/mL (ref 0.30–0.70)

## 2019-02-21 LAB — CBC WITH DIFFERENTIAL/PLATELET
Abs Immature Granulocytes: 0.14 10*3/uL — ABNORMAL HIGH (ref 0.00–0.07)
Basophils Absolute: 0.2 10*3/uL — ABNORMAL HIGH (ref 0.0–0.1)
Basophils Relative: 1 %
Eosinophils Absolute: 0.4 10*3/uL (ref 0.0–0.5)
Eosinophils Relative: 2 %
HCT: 38.6 % (ref 36.0–46.0)
Hemoglobin: 12.8 g/dL (ref 12.0–15.0)
Immature Granulocytes: 1 %
Lymphocytes Relative: 26 %
Lymphs Abs: 6 10*3/uL — ABNORMAL HIGH (ref 0.7–4.0)
MCH: 31.3 pg (ref 26.0–34.0)
MCHC: 33.2 g/dL (ref 30.0–36.0)
MCV: 94.4 fL (ref 80.0–100.0)
Monocytes Absolute: 2.6 10*3/uL — ABNORMAL HIGH (ref 0.1–1.0)
Monocytes Relative: 11 %
Neutro Abs: 14 10*3/uL — ABNORMAL HIGH (ref 1.7–7.7)
Neutrophils Relative %: 59 %
Platelets: 433 10*3/uL — ABNORMAL HIGH (ref 150–400)
RBC: 4.09 MIL/uL (ref 3.87–5.11)
RDW: 13.1 % (ref 11.5–15.5)
WBC: 23.2 10*3/uL — ABNORMAL HIGH (ref 4.0–10.5)
nRBC: 0 % (ref 0.0–0.2)

## 2019-02-21 LAB — PROCALCITONIN: Procalcitonin: <0.1 ng/mL

## 2019-02-21 LAB — COMPREHENSIVE METABOLIC PANEL
ALT: 17 U/L (ref 0–44)
AST: 15 U/L (ref 15–41)
Albumin: 3.1 g/dL — ABNORMAL LOW (ref 3.5–5.0)
Alkaline Phosphatase: 61 U/L (ref 38–126)
Anion gap: 10 (ref 5–15)
BUN: 10 mg/dL (ref 8–23)
CO2: 26 mmol/L (ref 22–32)
Calcium: 8.7 mg/dL — ABNORMAL LOW (ref 8.9–10.3)
Chloride: 104 mmol/L (ref 98–111)
Creatinine, Ser: 0.75 mg/dL (ref 0.44–1.00)
GFR calc Af Amer: >60 mL/min (ref >60)
GFR calc non Af Amer: >60 mL/min (ref >60)
Glucose, Bld: 137 mg/dL — ABNORMAL HIGH (ref 70–99)
Potassium: 3.9 mmol/L (ref 3.5–5.1)
Sodium: 140 mmol/L (ref 135–145)
Total Bilirubin: 0.6 mg/dL (ref 0.3–1.2)
Total Protein: 5.7 g/dL — ABNORMAL LOW (ref 6.5–8.1)

## 2019-02-21 LAB — TROPONIN I (HIGH SENSITIVITY): Troponin I (High Sensitivity): 5 ng/L (ref <18)

## 2019-02-21 LAB — HIV ANTIBODY (ROUTINE TESTING W REFLEX): HIV Screen 4th Generation wRfx: NONREACTIVE

## 2019-02-21 LAB — FERRITIN: Ferritin: 79 ng/mL (ref 11–307)

## 2019-02-21 LAB — C-REACTIVE PROTEIN: CRP: 6.5 mg/dL — ABNORMAL HIGH (ref <1.0)

## 2019-02-21 LAB — D-DIMER, QUANTITATIVE: D-Dimer, Quant: 1.11 ug/mL-FEU — ABNORMAL HIGH (ref 0.00–0.50)

## 2019-02-21 LAB — ABO/RH: ABO/RH(D): A POS

## 2019-02-21 MED ORDER — METOPROLOL SUCCINATE ER 25 MG PO TB24
12.5000 mg | ORAL_TABLET | Freq: Every day | ORAL | Status: DC
  Administered 2019-02-21 – 2019-02-24 (×4): 12.5 mg via ORAL
  Filled 2019-02-21 (×4): qty 1

## 2019-02-21 MED ORDER — ONDANSETRON HCL 4 MG/2ML IJ SOLN
4.0000 mg | Freq: Four times a day (QID) | INTRAMUSCULAR | Status: DC | PRN
  Administered 2019-02-21: 14:00:00 4 mg via INTRAVENOUS
  Filled 2019-02-21: qty 2

## 2019-02-21 MED ORDER — MORPHINE SULFATE (PF) 2 MG/ML IV SOLN
1.0000 mg | INTRAVENOUS | Status: DC | PRN
  Administered 2019-02-21 (×4): 1 mg via INTRAVENOUS
  Filled 2019-02-21 (×4): qty 1

## 2019-02-21 MED ORDER — ONDANSETRON HCL 4 MG PO TABS
4.0000 mg | ORAL_TABLET | Freq: Four times a day (QID) | ORAL | Status: DC | PRN
  Administered 2019-02-21: 4 mg via ORAL
  Filled 2019-02-21: qty 1

## 2019-02-21 MED ORDER — FERROUS SULFATE 325 (65 FE) MG PO TABS
325.0000 mg | ORAL_TABLET | Freq: Every day | ORAL | Status: DC
  Administered 2019-02-21 – 2019-02-24 (×4): 325 mg via ORAL
  Filled 2019-02-21 (×4): qty 1

## 2019-02-21 MED ORDER — PANTOPRAZOLE SODIUM 40 MG PO TBEC
40.0000 mg | DELAYED_RELEASE_TABLET | Freq: Every day | ORAL | Status: DC
  Administered 2019-02-21 – 2019-02-24 (×4): 40 mg via ORAL
  Filled 2019-02-21 (×4): qty 1

## 2019-02-21 MED ORDER — ACETAMINOPHEN 650 MG RE SUPP: 650.0000 mg | Freq: Four times a day (QID) | RECTAL | Status: DC | PRN

## 2019-02-21 MED ORDER — ROSUVASTATIN CALCIUM 5 MG PO TABS
10.0000 mg | ORAL_TABLET | Freq: Every day | ORAL | Status: DC
  Administered 2019-02-21 – 2019-02-24 (×4): 10 mg via ORAL
  Filled 2019-02-21 (×4): qty 2

## 2019-02-21 MED ORDER — ACETAMINOPHEN 325 MG PO TABS
650.0000 mg | ORAL_TABLET | Freq: Four times a day (QID) | ORAL | Status: DC | PRN
  Administered 2019-02-21 – 2019-02-23 (×4): 650 mg via ORAL
  Filled 2019-02-21 (×4): qty 2

## 2019-02-21 MED ORDER — ASPIRIN 81 MG PO CHEW
81.0000 mg | CHEWABLE_TABLET | Freq: Every day | ORAL | Status: DC
  Administered 2019-02-21 – 2019-02-24 (×4): 81 mg via ORAL
  Filled 2019-02-21 (×4): qty 1

## 2019-02-21 MED ORDER — FOLIC ACID 1 MG PO TABS
1.0000 mg | ORAL_TABLET | Freq: Every day | ORAL | Status: DC
  Administered 2019-02-21 – 2019-02-24 (×4): 1 mg via ORAL
  Filled 2019-02-21 (×4): qty 1

## 2019-02-21 MED ORDER — INSULIN ASPART 100 UNIT/ML ~~LOC~~ SOLN
0.0000 [IU] | Freq: Three times a day (TID) | SUBCUTANEOUS | Status: DC
  Administered 2019-02-22: 12:00:00 2 [IU] via SUBCUTANEOUS
  Administered 2019-02-23 – 2019-02-24 (×2): 1 [IU] via SUBCUTANEOUS

## 2019-02-21 MED ORDER — SODIUM CHLORIDE 0.9 % IV SOLN
INTRAVENOUS | Status: DC
  Administered 2019-02-23: 1000 mL via INTRAVENOUS

## 2019-02-21 MED ORDER — METOPROLOL SUCCINATE ER 25 MG PO TB24: 25.0000 mg | ORAL_TABLET | Freq: Every day | ORAL | Status: DC

## 2019-02-21 MED ORDER — LEVOTHYROXINE SODIUM 75 MCG PO TABS
175.0000 ug | ORAL_TABLET | Freq: Every day | ORAL | Status: DC
  Administered 2019-02-21 – 2019-02-24 (×4): 175 ug via ORAL
  Filled 2019-02-21 (×4): qty 1

## 2019-02-21 NOTE — Progress Notes (Signed)
Daughter, [name], called and updated. She expressed questions about possible discharge dates and concern for the PE pathology being related to her mother's platelet levels and hx. Emotional support was given and plans were established to pass her concerns to day shift for the MD to review.

## 2019-02-21 NOTE — H&P (Addendum)
History and Physical    Jeanne Sims DDU:[id number] DOB: 10-23-1955 DOA: 02/20/2019  PCP: Bonnita Nasuti, MD  Patient coming from: Home.  Chief Complaint: Shortness of breath.  HPI: Jeanne Sims is a 64 y.o. female with history of ovarian cancer, cervical cancer, lymphoma in remission and chronic leukocytosis thrombocytosis, mitral valve prolapse with palpitation on metoprolol, hyperlipidemia hypothyroidism diabetes mellitus was diagnosed with Covid infection around the last week of January about 3 weeks ago.  At that time patient had some headaches and upper respiratory symptoms and diarrhea.  Had resolved by February 5 and had gone back to work around February 8.  Last 2 days patient started getting increasing shortness of breath with minimal exertion with left-sided pleuritic chest pain.  Denies any fever or chills and no further diarrhea.  ED Course: In the ER patient was hemodynamically stable CT angiogram shows bilateral pulmonary embolism with no strain pattern.  EKG shows normal sinus rhythm nonspecific ST-T changes.  Labs show WBC of 23.2 CRP is presently around 6.5 high sensitive troponin 5 procalcitonin less than 0.1 and D-dimer 1.1.  Patient was started on heparin and admitted for further management of acute pulmonary embolism.  Right lower extremity DVT study was negative for DVT.  Review of Systems: As per HPI, rest all negative.   Past Medical History:  Diagnosis Date  . Acquired deafness   . Cervical cancer ([place]) 1075  . Cervical cancer ([place])   . Diabetes mellitus without complication ([place])   . Dyspareunia   . Heart murmur   . Hodgkin disease ([place]) 1987  . Ovarian cancer [facility])    age 89  . Thyroid disease     Past Surgical History:  Procedure Laterality Date  . ABDOMINAL HYSTERECTOMY     TAH/BSO at age 60 d/t cervical CA  . BASAL CELL CARCINOMA EXCISION     face  . BOWEL RESECTION     followed splenectomy.  had bowel obstruction.  . COCHLEAR IMPLANT  1983     Dr. Thornell Mule  . SPLENECTOMY       reports that she has never smoked. She has never used smokeless tobacco. She reports that she does not drink alcohol or use drugs.  No Known Allergies  Family History  Problem Relation Age of Onset  . Heart failure Mother   . Diabetes Mother   . Diabetes Brother   . Diabetes Sister   . Cervical cancer Sister   . Diabetes Sister   . Cervical cancer Sister   . Diabetes Sister   . Diabetes Brother   . Breast cancer Paternal Uncle     Prior to Admission medications   Medication Sig Start Date End Date Taking? Authorizing Provider  aspirin 81 MG chewable tablet Chew 81 mg by mouth daily.    [provider]  Cinnamon 500 MG capsule Take 2,000 mg by mouth 2 (two) times daily.    [provider]  Cyanocobalamin 1000 MCG/ML KIT Inject as directed every 30 (thirty) days.    [provider]  dexlansoprazole (DEXILANT) 60 MG capsule Take 60 mg by mouth daily.    [provider]  Dulaglutide (TRULICITY) 1.5 CB/7.6EG SOPN Inject into the skin once a week.    [provider]  estradiol (CLIMARA - DOSED IN MG/24 HR) 0.025 mg/24hr patch Place 0.025 mg onto the skin once a week.    [provider]  ferrous sulfate 325 (65 FE) MG tablet Take 1 tablet (325  mg total) by mouth daily with breakfast. Please take with a source of vitamin C (orange juice or tablet with 500 units of Vitamin C). 11/19/18   Orson Slick, MD  folic acid (FOLVITE) 161 MCG tablet Take 800 mcg by mouth daily.    [provider]  levothyroxine (EUTHYROX) 175 MCG tablet Take 175 mcg by mouth daily before breakfast.    [provider]  linaclotide (LINZESS) 290 MCG CAPS capsule Take 290 mcg by mouth daily before breakfast.    [provider]  metFORMIN (GLUCOPHAGE) 500 MG tablet Take 500 mg by mouth 2 (two) times daily with a meal.    [provider]  metoprolol succinate (TOPROL-XL) 25 MG 24 hr tablet Take  25 mg by mouth daily.    [provider]  rosuvastatin (CRESTOR) 10 MG tablet Take 10 mg by mouth daily.    [provider]  Vitamin D, Ergocalciferol, (DRISDOL) 1.25 MG (50000 UT) CAPS capsule Take 50,000 Units by mouth every 7 (seven) days.    [provider]    Physical Exam: Constitutional: Moderately built and nourished. Vitals:   02/20/19 2200 02/20/19 2230 02/20/19 2300 02/21/19 0028  BP: (!) 120/51 (!) 120/53 (!) 121/53 (!) 122/52  Pulse: 81 80 78 78  Resp: 16 17 16 12   Temp:    98.3 F (36.8 C)  TempSrc:    Oral  SpO2: 98% 99% 99% 97%  Weight:      Height:       Eyes: Anicteric no pallor. ENMT: No discharge from the ears eyes nose or mouth. Neck: No mass or.  No neck rigidity. Respiratory: No rhonchi or crepitations. Cardiovascular: S1-S2 heard. Abdomen: Soft nontender bowel sounds present. Musculoskeletal: Mild edema. Skin: No rash. Neurologic: Alert awake oriented time place and person.  Moves all extremities. Psychiatric: Appears normal per normal affect.   Labs on Admission: I have personally reviewed following labs and imaging studies  CBC: Recent Labs  Lab 02/20/19 1113  WBC 23.2*  NEUTROABS 14.3*  HGB 14.3  HCT 43.5  MCV 95.2  PLT 096*   Basic Metabolic Panel: Recent Labs  Lab 02/20/19 1217  NA 137  K 3.9  CL 105  CO2 23  GLUCOSE 151*  BUN 12  CREATININE 0.56  CALCIUM 8.9   GFR: Estimated Creatinine Clearance: 79.8 mL/min (by C-G formula based on SCr of 0.56 mg/dL). Liver Function Tests: Recent Labs  Lab 02/20/19 1217  AST 16  ALT 18  ALKPHOS 58  BILITOT 0.7  PROT 7.0  ALBUMIN 3.5   Recent Labs  Lab 02/20/19 1217  LIPASE 19   No results for input(s): AMMONIA in the last 168 hours. Coagulation Profile: No results for input(s): INR, PROTIME in the last 168 hours. Cardiac Enzymes: No results for input(s): CKTOTAL, CKMB, CKMBINDEX, TROPONINI in the last 168 hours. BNP (last 3 results) No results  for input(s): PROBNP in the last 8760 hours. HbA1C: No results for input(s): HGBA1C in the last 72 hours. CBG: No results for input(s): GLUCAP in the last 168 hours. Lipid Profile: No results for input(s): CHOL, HDL, LDLCALC, TRIG, CHOLHDL, LDLDIRECT in the last 72 hours. Thyroid Function Tests: No results for input(s): TSH, T4TOTAL, FREET4, T3FREE, THYROIDAB in the last 72 hours. Anemia Panel: No results for input(s): VITAMINB12, FOLATE, FERRITIN, TIBC, IRON, RETICCTPCT in the last 72 hours. Urine analysis:    Component Value Date/Time   COLORURINE YELLOW 12/18/2009 [place] 12/18/2009 0454  LABSPEC 1.025 12/18/2009 0842   PHURINE 5.5 12/18/2009 0842   GLUCOSEU NEGATIVE 12/18/2009 0842   HGBUR TRACE (A) 12/18/2009 0842   BILIRUBINUR n 07/22/2012 0852   KETONESUR NEGATIVE 12/18/2009 0842   PROTEINUR n 07/22/2012 0852   PROTEINUR NEGATIVE 12/18/2009 0842   UROBILINOGEN negative 07/22/2012 0852   UROBILINOGEN 0.2 12/18/2009 0842   NITRITE n 07/22/2012 0852   NITRITE NEGATIVE 12/18/2009 0842   LEUKOCYTESUR Negative 07/22/2012 0852   Sepsis Labs: @LABRCNTIP (procalcitonin:4,lacticidven:4) ) Recent Results (from the past 240 hour(s))  Respiratory Panel by RT PCR (Flu A&B, Covid) - Nasopharyngeal Swab     Status: Abnormal   Collection Time: 02/20/19  4:01 PM   Specimen: Nasopharyngeal Swab  Result Value Ref Range Status   SARS Coronavirus 2 by RT PCR POSITIVE (A) NEGATIVE Final    Comment: RESULT CALLED TO, READ BACK BY AND VERIFIED WITH: GOUGE S TN [id number] [id number] PHILLIPS C (NOTE) SARS-CoV-2 target nucleic acids are DETECTED. SARS-CoV-2 RNA is generally detectable in upper respiratory specimens  during the acute phase of infection. Positive results are indicative of the presence of the identified virus, but do not rule out bacterial infection or co-infection with other pathogens not detected by the test. Clinical correlation with patient history and other  diagnostic information is necessary to determine patient infection status. The expected result is Negative. Fact Sheet for Patients:  PinkCheek.be Fact Sheet for Healthcare Providers: GravelBags.it This test is not yet approved or cleared by the [place] FDA and  has been authorized for detection and/or diagnosis of SARS-CoV-2 by FDA under an Emergency Use Authorization (EUA).  This EUA will remain in effect (meaning this test can be used) for  the duration of  the COVID-19 declaration under Section 564(b)(1) of the Act, 21 U.S.C. section 360bbb-3(b)(1), unless the authorization is terminated or revoked sooner.    Influenza A by PCR NEGATIVE NEGATIVE Final   Influenza B by PCR NEGATIVE NEGATIVE Final    Comment: (NOTE) The Xpert Xpress SARS-CoV-2/FLU/RSV assay is intended as an aid in  the diagnosis of influenza from Nasopharyngeal swab specimens and  should not be used as a sole basis for treatment. Nasal washings and  aspirates are unacceptable for Xpert Xpress SARS-CoV-2/FLU/RSV  testing. Fact Sheet for Patients: PinkCheek.be Fact Sheet for Healthcare Providers: GravelBags.it This test is not yet approved or cleared by the [place] FDA and  has been authorized for detection and/or diagnosis of SARS-CoV-2 by  FDA under an Emergency Use Authorization (EUA). This EUA will remain  in effect (meaning this test can be used) for the duration of the  Covid-19 declaration under Section 564(b)(1) of the Act, 21  U.S.C. section 360bbb-3(b)(1), unless the authorization is  terminated or revoked. Performed at [facility], [address] [place], [place] [address]      Radiological Exams on Admission: CT Angio Chest PE W and/or Wo Contrast  Result Date: 02/20/2019 CLINICAL DATA:  Pleuritic chest pain with shortness of breath and tachypnea. Current  COVID infection. New right leg pain. History of non-Hodgkin's lymphoma, cervical cancer, ovarian cancer. EXAM: CT ANGIOGRAPHY CHEST WITH CONTRAST TECHNIQUE: Multidetector CT imaging of the chest was performed using the standard protocol during bolus administration of intravenous contrast. Multiplanar CT image reconstructions and MIPs were obtained to evaluate the vascular anatomy. CONTRAST:  20m OMNIPAQUE IOHEXOL 350 MG/ML SOLN COMPARISON:  CT chest 11/10/2018. FINDINGS: Cardiovascular: Filling defect is identified in the right middle lobe medial and lateral segmental pulmonary arteries,  in the anterior basal and medial basal right lower lobe segmental pulmonary arteries, and in a subsegmental pulmonary artery in the left lower lobe (image 207/6). No lobar are larger thrombus is identified, and as result, this case does not qualify for submassive pulmonary embolus, which necessitates lobar or larger thrombus based on our current protocols. Coronary, aortic arch, and branch vessel atherosclerotic vascular disease. Overall heart size within normal limits. 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 Although the patient has a current SARS-CoV-2 infection, we do not demonstrate the typical patchy ground-glass opacities of active COVID pneumonia. Trace left pleural effusion. Upper Abdomen: Stable 0.9 cm hypodense lesion in segment 4 of the liver on image 91/4, previously characterized as a cyst. 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 Review of the MIP images confirms the above findings. IMPRESSION: 1. Acute segmental pulmonary emboli in the right middle lobe and right lower. Acute subsegmental embolus in the left lower lobe. Lobe  overall clot burden is small. No lobar or larger thrombus is identified, accordingly this does not qualify as submassive pulmonary embolus based on current protocols. 2. Trace left pleural effusion. 3. Coronary, aortic arch, and branch vessel atherosclerotic vascular disease. Aortic Atherosclerosis (ICD10-I70.0). 4. 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 No lung opacities to suggest active pneumonia related to the patient's current SARS-CoV-2 infection. 5. 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 6. Stable hypodense lesion in segment 4 of the liver, previously characterized as a cyst. Critical Value/emergent results were called by telephone at the time of interpretation on 02/20/2019 at 2:05 pm to provider Dr. Marda Stalker , who verbally acknowledged these results. Electronically Signed   By: Van Clines M.D.   On: 02/20/2019 14:28   US Venous Img Lower Unilateral Right  Result Date: 02/20/2019 CLINICAL DATA:  Right calf cramping. EXAM: RIGHT LOWER EXTREMITY VENOUS DOPPLER ULTRASOUND TECHNIQUE: Gray-scale sonography with graded compression, as well as color Doppler and duplex ultrasound were performed to evaluate the lower extremity deep venous systems from the level of the common femoral vein and including the common femoral, femoral, profunda femoral, popliteal and calf veins including the posterior tibial, peroneal and gastrocnemius veins when visible. The superficial great saphenous vein was also interrogated. Spectral Doppler was utilized to evaluate flow at rest and with distal augmentation maneuvers in the common femoral, femoral and popliteal veins. COMPARISON:  None. FINDINGS: Contralateral Common Femoral Vein: Respiratory phasicity is normal and symmetric with the symptomatic side. No evidence of thrombus. Normal  compressibility. Common Femoral Vein: No evidence of thrombus. Normal compressibility, respiratory phasicity and response to augmentation. Saphenofemoral Junction: No evidence of thrombus. Normal compressibility and flow on color Doppler imaging. Profunda Femoral Vein: No evidence of thrombus. Normal compressibility and flow on color Doppler imaging. Femoral Vein: No evidence of thrombus. Normal compressibility, respiratory phasicity and response to augmentation. Popliteal Vein: No evidence of thrombus. Normal compressibility, respiratory phasicity and response to augmentation. Calf Veins: No evidence of thrombus. Normal compressibility and flow on color Doppler imaging. Superficial Great Saphenous Vein: No evidence of thrombus. Normal compressibility. Venous Reflux:  None. Other Findings: No evidence of superficial thrombophlebitis or abnormal fluid collection. IMPRESSION: No  evidence of right lower extremity deep venous thrombosis. Electronically Signed   By: Aletta Edouard M.D.   On: 02/20/2019 13:21    EKG: Independently reviewed.  Normal sinus rhythm.  Assessment/Plan Principal Problem:   Acute pulmonary embolism without acute cor pulmonale (HCC) Active Problems:   HLD (hyperlipidemia)   Controlled type 2 diabetes mellitus with hyperglycemia (HCC)   Hypothyroidism   Acute pulmonary embolism ([place])   COVID-19 virus infection    1. Acute pulmonary embolism involving bilaterally with no strain pattern presently hemodynamically stable likely provoked by recent COVID-19 infection.  Patient is on heparin if continues to remain stable may change to oral anticoagulation.  Check 2D echo.  Cardiac markers. 2. COVID-19 infection presently inflammatory markers elevated but CAT scan as per the radiologist reading does not show any definite evidence of any infiltrates and patient is not hypoxic.  Will closely monitor. 3. Leukocytosis -patient has a chronic history of leukocytosis being followed by  oncologist.  WBC count is increased from baseline.  Patient afebrile. 4. Diabetes mellitus type 2 we will keep patient on sliding scale coverage. 5. History of mitral valve prolapse and palpitation metoprolol. 6. Hyperlipidemia on statins. 7. Hypothyroidism on Synthroid. 8. History of Hodgkin's lymphoma status post radiation, ovarian and cervical cancer in remission.  Given the acute respiratory failure with bilateral pulmonary embolism will need close monitoring for any deterioration and will need inpatient status.   DVT prophylaxis: Heparin. Code Status: Full code. Family Communication: Discussed with patient. Disposition Plan: Home. Consults called: None. Admission status: Inpatient.   Rise Patience MD [facility] Pager [phone].  If 7PM-7AM, please contact night-coverage [email or web address] Password [email or web address]  02/21/2019, 1:48 AM

## 2019-02-21 NOTE — Progress Notes (Signed)
Jeanne Sims [id number] Admission Data: 02/21/2019 12:44 AM Attending Provider: Rise Patience, MD  VZD:GLOVF, Rosalyn Charters, MD Consults/ Treatment Team:   Jeanne Sims is a 64 y.o. female patient admitted [facility] ED awake, alert  & orientated  X 3,  No Order, VSS - Blood pressure (!) 122/52, pulse 78, temperature 98.3 F (36.8 C), temperature source Oral, resp. rate 12, height 5\' 6"  (1.676 m), weight 86.6 kg, SpO2 97 %., O2 2L nasal cannular, no c/o shortness of breath, no c/o chest pain, no distress noted. Tele # [id number] placed and pt is currently running:normal sinus rhythm.   IV site WDL:  wrist right, condition patent and no redness and forearm left, condition patent and no redness with a transparent dsg that's clean dry and intact.  Allergies:  No Known Allergies   Past Medical History:  Diagnosis Date  . Acquired deafness   . Cervical cancer ([place]) 1075  . Cervical cancer ([place])   . Diabetes mellitus without complication ([place])   . Dyspareunia   . Heart murmur   . Hodgkin disease ([place]) 1987  . Ovarian cancer [facility])    age 41  . Thyroid disease    Pt orientation to unit, room and routine. Information packet given to patient/family and safety video watched.  Admission INP armband ID verified with patient/family, and in place. SR up x 2, fall risk assessment complete with Patient and family verbalizing understanding of risks associated with falls. Pt verbalizes an understanding of how to use the call bell and to call for help before getting out of bed.  Skin, clean-dry- intact without evidence of bruising, or skin tears.   No evidence of skin break down noted on exam. no rashes, no ecchymoses, no wounds  Will cont to monitor and assist as needed.  Walker Shadow, RN 02/21/2019 12:44 AM

## 2019-02-21 NOTE — Progress Notes (Signed)
PROGRESS NOTE                                                                                                                                                                                                             Patient Demographics:    Jeanne Sims, is a 64 y.o. female, DOB - 10/16/1955, URK:[id number]  Admit date - 02/20/2019   Admitting Physician Rise Patience, MD  Outpatient Primary MD for the patient is Hague, Rosalyn Charters, MD  LOS - 1   Chief Complaint  Patient presents with  . Shortness of Breath    COVID+       Brief Narrative   This is a no charge note as patient was seen and admitted earlier today, chart, imaging, and labs were reviewed.  HPI: Jeanne Sims is a 64 y.o. female with history of ovarian cancer, cervical cancer, lymphoma in remission and chronic leukocytosis thrombocytosis, mitral valve prolapse with palpitation on metoprolol, hyperlipidemia hypothyroidism diabetes mellitus was diagnosed with Covid infection around the last week of January about 3 weeks ago.  At that time patient had some headaches and upper respiratory symptoms and diarrhea.  Had resolved by February 5 and had gone back to work around February 8.  Last 2 days patient started getting increasing shortness of breath with minimal exertion with left-sided pleuritic chest pain.  Denies any fever or chills and no further diarrhea.  ED Course: In the ER patient was hemodynamically stable CT angiogram shows bilateral pulmonary embolism with no strain pattern.  EKG shows normal sinus rhythm nonspecific ST-T changes.  Labs show WBC of 23.2 CRP is presently around 6.5 high sensitive troponin 5 procalcitonin less than 0.1 and D-dimer 1.1.  Patient was started on heparin and admitted for further management of acute pulmonary embolism.  Right lower extremity DVT study was negative for DVT.   Subjective:    [name] today denies any fever or chills, she does  report some thick chest pain with deep breathing.   Assessment  & Plan :    Principal Problem:   Acute pulmonary embolism without acute cor pulmonale (HCC) Active Problems:   HLD (hyperlipidemia)   Controlled type 2 diabetes mellitus with hyperglycemia (HCC)   Hypothyroidism   Acute pulmonary embolism ([place])   COVID-19 virus infection  Acute pulmonary embolism involving bilaterally with  no strain pattern presently hemodynamically stable likely provoked by recent COVID-19 infection.  Patient is on heparin if continues to remain stable may change to oral anticoagulation.    2D echo with no evidence of right heart strain, troponins negative x3  COVID-19 infection presently inflammatory markers elevated but CAT scan as per the radiologist reading does not show any definite evidence of any infiltrates and patient is not hypoxic.  Will closely monitor.  Leukocytosis -patient has a chronic history of leukocytosis being followed by oncologist.  WBC count is increased from baseline.  Patient afebrile.  Diabetes mellitus type 2 we will keep patient on sliding scale coverage.  History of mitral valve prolapse and palpitation metoprolol.  Hyperlipidemia on statins.  Hypothyroidism on Synthroid.  History of Hodgkin's lymphoma status post radiation, ovarian and cervical cancer in remission.   COVID-19 Labs  Recent Labs    02/21/19 0240  DDIMER 1.11*  FERRITIN 79  CRP 6.5*    Lab Results  Component Value Date   SARSCOV2NAA POSITIVE (A) 02/20/2019     Code Status : Full  Family Communication  : D/W daughter  Disposition Plan  : Home  Barriers For Discharge : remains on Heparin GTT  Consults  : Discussed with oncology via phone  Procedures  : None  DVT Prophylaxis  :  Heparin GTT  Lab Results  Component Value Date   PLT 433 (H) 02/21/2019    Antibiotics  :    Anti-infectives (From admission, onward)   None        Objective:   Vitals:   02/21/19 0028 02/21/19  0400 02/21/19 0800 02/21/19 1117  BP: (!) 122/52 (!) 126/57 (!) 114/49 (!) 122/56  Pulse: 78 81  79  Resp: 12 12  17   Temp: 98.3 F (36.8 C) 98.5 F (36.9 C)    TempSrc: Oral Oral    SpO2: 97% 100%  100%  Weight:      Height:        Wt Readings from Last 3 Encounters:  02/20/19 86.6 kg  11/17/18 87.2 kg  10/27/18 87 kg     Intake/Output Summary (Last 24 hours) at 02/21/2019 1416 Last data filed at 02/21/2019 0630 Gross per 24 hour  Intake 240 ml  Output --  Net 240 ml     Physical Exam  Awake Alert, Oriented X 3, No new F.N deficits, Normal affect Symmetrical Chest wall movement, Good air movement bilaterally, CTAB RRR,No Gallops,Rubs or new Murmurs, No Parasternal Heave +ve B.Sounds, Abd Soft, No tenderness,  No rebound - guarding or rigidity. No Cyanosis, Clubbing or edema, No new Rash or bruise      Data Review:    CBC Recent Labs  Lab 02/20/19 1113 02/21/19 0240  WBC 23.2* 23.2*  HGB 14.3 12.8  HCT 43.5 38.6  PLT 415* 433*  MCV 95.2 94.4  MCH 31.3 31.3  MCHC 32.9 33.2  RDW 13.5 13.1  LYMPHSABS 5.8* 6.0*  MONOABS 2.5* 2.6*  EOSABS 0.2 0.4  BASOSABS 0.2* 0.2*    Chemistries  Recent Labs  Lab 02/20/19 1217 02/21/19 0240  NA 137 140  K 3.9 3.9  CL 105 104  CO2 23 26  GLUCOSE 151* 137*  BUN 12 10  CREATININE 0.56 0.75  CALCIUM 8.9 8.7*  AST 16 15  ALT 18 17  ALKPHOS 58 61  BILITOT 0.7 0.6   ------------------------------------------------------------------------------------------------------------------ No results for input(s): CHOL, HDL, LDLCALC, TRIG, CHOLHDL, LDLDIRECT in the last 72 hours.  No results  found for: HGBA1C ------------------------------------------------------------------------------------------------------------------ No results for input(s): TSH, T4TOTAL, T3FREE, THYROIDAB in the last 72 hours.  Invalid input(s):  FREET3 ------------------------------------------------------------------------------------------------------------------ Recent Labs    02/21/19 0240  FERRITIN 79    Coagulation profile No results for input(s): INR, PROTIME in the last 168 hours.  Recent Labs    02/21/19 0240  DDIMER 1.11*    Cardiac Enzymes No results for input(s): CKMB, TROPONINI, MYOGLOBIN in the last 168 hours.  Invalid input(s): CK ------------------------------------------------------------------------------------------------------------------ No results found for: BNP  Inpatient Medications  Scheduled Meds: . aspirin  81 mg Oral Daily  . ferrous sulfate  325 mg Oral Q breakfast  . folic acid  1 mg Oral Daily  . insulin aspart  0-9 Units Subcutaneous TID WC  . levothyroxine  175 mcg Oral QAC breakfast  . metoprolol succinate  12.5 mg Oral Daily  . ondansetron (ZOFRAN) IV  4 mg Intravenous Once  . pantoprazole  40 mg Oral Daily  . rosuvastatin  10 mg Oral Daily   Continuous Infusions: . sodium chloride 10 mL/hr at 02/20/19 1552  . sodium chloride 75 mL/hr at 02/21/19 1116  . heparin 1,200 Units/hr (02/21/19 0843)   PRN Meds:.sodium chloride, acetaminophen **OR** acetaminophen, morphine injection, ondansetron **OR** ondansetron (ZOFRAN) IV  Micro Results Recent Results (from the past 240 hour(s))  Respiratory Panel by RT PCR (Flu A&B, Covid) - Nasopharyngeal Swab     Status: Abnormal   Collection Time: 02/20/19  4:01 PM   Specimen: Nasopharyngeal Swab  Result Value Ref Range Status   SARS Coronavirus 2 by RT PCR POSITIVE (A) NEGATIVE Final    Comment: RESULT CALLED TO, READ BACK BY AND VERIFIED WITH: GOUGE S TN 1702 [id number] PHILLIPS C (NOTE) SARS-CoV-2 target nucleic acids are DETECTED. SARS-CoV-2 RNA is generally detectable in upper respiratory specimens  during the acute phase of infection. Positive results are indicative of the presence of the identified virus, but do not rule  out bacterial infection or co-infection with other pathogens not detected by the test. Clinical correlation with patient history and other diagnostic information is necessary to determine patient infection status. The expected result is Negative. Fact Sheet for Patients:  PinkCheek.be Fact Sheet for Healthcare Providers: GravelBags.it This test is not yet approved or cleared by the [place] FDA and  has been authorized for detection and/or diagnosis of SARS-CoV-2 by FDA under an Emergency Use Authorization (EUA).  This EUA will remain in effect (meaning this test can be used) for  the duration of  the COVID-19 declaration under Section 564(b)(1) of the Act, 21 U.S.C. section 360bbb-3(b)(1), unless the authorization is terminated or revoked sooner.    Influenza A by PCR NEGATIVE NEGATIVE Final   Influenza B by PCR NEGATIVE NEGATIVE Final    Comment: (NOTE) The Xpert Xpress SARS-CoV-2/FLU/RSV assay is intended as an aid in  the diagnosis of influenza from Nasopharyngeal swab specimens and  should not be used as a sole basis for treatment. Nasal washings and  aspirates are unacceptable for Xpert Xpress SARS-CoV-2/FLU/RSV  testing. Fact Sheet for Patients: PinkCheek.be Fact Sheet for Healthcare Providers: GravelBags.it This test is not yet approved or cleared by the [place] FDA and  has been authorized for detection and/or diagnosis of SARS-CoV-2 by  FDA under an Emergency Use Authorization (EUA). This EUA will remain  in effect (meaning this test can be used) for the duration of the  Covid-19 declaration under Section 564(b)(1) of the Act, 21  U.S.C. section 360bbb-3(b)(1), unless the authorization  is  terminated or revoked. Performed at [facility], [address], [place], [place] [address]     Radiology Reports CT Angio Chest PE W and/or Wo  Contrast  Result Date: 02/20/2019 CLINICAL DATA:  Pleuritic chest pain with shortness of breath and tachypnea. Current COVID infection. New right leg pain. History of non-Hodgkin's lymphoma, cervical cancer, ovarian cancer. EXAM: CT ANGIOGRAPHY CHEST WITH CONTRAST TECHNIQUE: Multidetector CT imaging of the chest was performed using the standard protocol during bolus administration of intravenous contrast. Multiplanar CT image reconstructions and MIPs were obtained to evaluate the vascular anatomy. CONTRAST:  22mL OMNIPAQUE IOHEXOL 350 MG/ML SOLN COMPARISON:  CT chest 11/10/2018. FINDINGS: Cardiovascular: Filling defect is identified in the right middle lobe medial and lateral segmental pulmonary arteries, in the anterior basal and medial basal right lower lobe segmental pulmonary arteries, and in a subsegmental pulmonary artery in the left lower lobe (image 207/6). No lobar are larger thrombus is identified, and as result, this case does not qualify for submassive pulmonary embolus, which necessitates lobar or larger thrombus based on our current protocols. Coronary, aortic arch, and branch vessel atherosclerotic vascular disease. Overall heart size within normal limits. 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 Although the patient has a current SARS-CoV-2 infection, we do not demonstrate the typical patchy ground-glass opacities of active COVID pneumonia. Trace left pleural effusion. Upper Abdomen: Stable 0.9 cm hypodense lesion in segment 4 of the liver on image 91/4, previously characterized as a cyst. 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 Review of the MIP images confirms the above findings. IMPRESSION: 1. Acute  segmental pulmonary emboli in the right middle lobe and right lower. Acute subsegmental embolus in the left lower lobe. Lobe overall clot burden is small. No lobar or larger thrombus is identified, accordingly this does not qualify as submassive pulmonary embolus based on current protocols. 2. Trace left pleural effusion. 3. Coronary, aortic arch, and branch vessel atherosclerotic vascular disease. Aortic Atherosclerosis (ICD10-I70.0). 4. 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 No lung opacities to suggest active pneumonia related to the patient's current SARS-CoV-2 infection. 5. 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 6. Stable hypodense lesion in segment 4 of the liver, previously characterized as a cyst. Critical Value/emergent results were called by telephone at the time of interpretation on 02/20/2019 at 2:05 pm to provider Dr. Marda Stalker , who verbally acknowledged these results. Electronically Signed   By: Van Clines M.D.   On: 02/20/2019 14:28   US Venous Img Lower Unilateral Right  Result Date: 02/20/2019 CLINICAL DATA:  Right calf cramping. EXAM: RIGHT LOWER EXTREMITY VENOUS DOPPLER ULTRASOUND TECHNIQUE: Gray-scale sonography with graded compression, as well as color Doppler and duplex ultrasound were performed to evaluate the lower extremity deep venous systems from the level of the common femoral vein and including the common femoral, femoral, profunda femoral, popliteal and calf veins including the posterior tibial, peroneal and gastrocnemius veins when visible. The superficial great saphenous vein was also interrogated. Spectral Doppler was utilized to evaluate flow at rest and with distal augmentation maneuvers in the common femoral, femoral and popliteal veins. COMPARISON:  None. FINDINGS: Contralateral  Common Femoral  Vein: Respiratory phasicity is normal and symmetric with the symptomatic side. No evidence of thrombus. Normal compressibility. Common Femoral Vein: No evidence of thrombus. Normal compressibility, respiratory phasicity and response to augmentation. Saphenofemoral Junction: No evidence of thrombus. Normal compressibility and flow on color Doppler imaging. Profunda Femoral Vein: No evidence of thrombus. Normal compressibility and flow on color Doppler imaging. Femoral Vein: No evidence of thrombus. Normal compressibility, respiratory phasicity and response to augmentation. Popliteal Vein: No evidence of thrombus. Normal compressibility, respiratory phasicity and response to augmentation. Calf Veins: No evidence of thrombus. Normal compressibility and flow on color Doppler imaging. Superficial Great Saphenous Vein: No evidence of thrombus. Normal compressibility. Venous Reflux:  None. Other Findings: No evidence of superficial thrombophlebitis or abnormal fluid collection. IMPRESSION: No evidence of right lower extremity deep venous thrombosis. Electronically Signed   By: Aletta Edouard M.D.   On: 02/20/2019 13:21   ECHOCARDIOGRAM LIMITED  Result Date: 02/21/2019    ECHOCARDIOGRAM LIMITED REPORT   Patient Name:   AKSHARA BLUMENTHAL [facility] Date of Exam: 02/21/2019 Medical Rec #:  [id number]       Height:       66.0 in Accession #:    [id number]      Weight:       191.0 lb Date of Birth:  02/12/1955        BSA:          1.96 m Patient Age:    38 years        BP:           114/49 mmHg Patient Gender: F               HR:           77 bpm. Exam Location:  Inpatient Procedure: Limited Echo, Cardiac Doppler and Limited Color Doppler Indications:    I26.02 Pulmonary embolus  History:        Patient has no prior history of Echocardiogram examinations.                 Signs/Symptoms:Dyspnea and Murmur; Risk Factors:Diabetes. COVID                 Positive. Cancer. Thyroid Disease.  Sonographer:    Tiffany Dance Referring Phys: [place]  Sonographer Comments: No subcostal window. IMPRESSIONS  1. Left ventricular ejection fraction, by estimation, is 60 to 65%. The left ventricle has normal function. The left ventricle has no regional wall motion abnormalities. Left ventricular diastolic function could not be evaluated.  2. Right ventricular systolic function is normal. The right ventricular size is normal.  3. The mitral valve is degenerative. Trivial mitral valve regurgitation.  4. The aortic valve is tricuspid. Aortic valve regurgitation is mild. Mild aortic valve stenosis. Comparison(s): No prior Echocardiogram. FINDINGS  Left Ventricle: Left ventricular ejection fraction, by estimation, is 60 to 65%. The left ventricle has normal function. The left ventricle has no regional wall motion abnormalities. The left ventricular internal cavity size was normal in size. There is  no left ventricular hypertrophy. The left ventricular diastology could not be evaluated due to mitral annular calcification (moderate or greater). Right Ventricle: The right ventricular size is normal. No increase in right ventricular wall thickness. Right ventricular systolic function is normal. Left Atrium: Left atrial size was normal in size. Right Atrium: Right atrial size was normal in size. Pericardium: There is no evidence of pericardial effusion. Mitral Valve: The mitral valve is degenerative in  appearance. Moderate mitral annular calcification. Trivial mitral valve regurgitation. Tricuspid Valve: The tricuspid valve is grossly normal. Tricuspid valve regurgitation is mild. Aortic Valve: The aortic valve is tricuspid. . There is moderate thickening and moderate calcification of the aortic valve. Aortic valve regurgitation is mild. Aortic regurgitation PHT measures 298 msec. Mild aortic stenosis is present. There is moderate  thickening of the aortic valve. There is moderate calcification of the aortic valve. Aortic valve mean gradient measures 11.3  mmHg. Aortic valve peak gradient measures 21.2 mmHg. Aortic valve area, by VTI measures 1.10 cm. Pulmonic Valve: The pulmonic valve was grossly normal. Pulmonic valve regurgitation is mild. Aorta: The aortic root is normal in size and structure. Venous: The inferior vena cava was not well visualized. IAS/Shunts: No atrial level shunt detected by color flow Doppler.  LEFT VENTRICLE PLAX 2D LVIDd:         3.93 cm LVIDs:         2.90 cm LV PW:         0.90 cm LV IVS:        0.86 cm LVOT diam:     2.00 cm LV SV:         54.98 ml LV SV Index:   17.15 LVOT Area:     3.14 cm  RIGHT VENTRICLE RV Basal diam:  2.73 cm TAPSE (M-mode): 2.7 cm LEFT ATRIUM             Index       RIGHT ATRIUM           Index LA diam:        3.70 cm 1.89 cm/m  RA Area:     14.60 cm LA Vol (A2C):   61.4 ml 31.31 ml/m RA Volume:   33.90 ml  17.29 ml/m LA Vol (A4C):   46.9 ml 23.92 ml/m LA Biplane Vol: 55.0 ml 28.05 ml/m  AORTIC VALVE AV Area (Vmax):    1.08 cm AV Area (Vmean):   1.08 cm AV Area (VTI):     1.10 cm AV Vmax:           230.00 cm/s AV Vmean:          158.333 cm/s AV VTI:            0.500 m AV Peak Grad:      21.2 mmHg AV Mean Grad:      11.3 mmHg LVOT Vmax:         79.10 cm/s LVOT Vmean:        54.200 cm/s LVOT VTI:          0.175 m LVOT/AV VTI ratio: 0.35 AI PHT:            298 msec  AORTA Ao Root diam: 3.20 cm Ao Asc diam:  2.70 cm MITRAL VALVE                TRICUSPID VALVE MV Area (PHT): 3.72 cm     TR Peak grad:   29.4 mmHg MV Decel Time: 204 msec     TR Vmax:        271.00 cm/s MV E velocity: 115.00 cm/s MV A velocity: 122.00 cm/s  SHUNTS MV E/A ratio:  0.94         Systemic VTI:  0.18 m                             Systemic Diam: 2.00 cm  Eleonore Chiquito MD Electronically signed by Eleonore Chiquito MD Signature Date/Time: 02/21/2019/1:19:24 PM    Final       Phillips Climes M.D on 02/21/2019 at 2:16 PM  Between 7am to 7pm - Pager - [phone]  After 7pm go to [email or web address] - password [facility]  [facility] -   Office  [phone]

## 2019-02-21 NOTE — Progress Notes (Signed)
Writer in contact with daughter [name] she has been updated on the plan of care and is thankful for the information. She is requesting to speak to a member of the medical team. The message has been relayed to [facility].D.

## 2019-02-21 NOTE — Progress Notes (Signed)
ANTICOAGULATION CONSULT NOTE  Pharmacy Consult for Heparin Indication: pulmonary embolus  No Known Allergies  Patient Measurements: Height: 5\' 6"  (167.6 cm) Weight: 191 lb (86.6 kg) IBW/kg (Calculated) : 59.3 Heparin Dosing Weight: 77.9 kg  Vital Signs: Temp: 98.5 F (36.9 C) (02/15 0400) Temp Source: Oral (02/15 0400) BP: 122/56 (02/15 1117) Pulse Rate: 79 (02/15 1117)  Labs: Recent Labs    02/20/19 1113 02/20/19 1217 02/20/19 1350 02/20/19 2106 02/21/19 0240 02/21/19 1054  HGB 14.3  --   --   --  12.8  --   HCT 43.5  --   --   --  38.6  --   PLT 415*  --   --   --  433*  --   HEPARINUNFRC  --   --   --  0.90*  --  0.45  CREATININE  --  0.56  --   --  0.75  --   TROPONINIHS 3  --  3  --  5  --     Estimated Creatinine Clearance: 79.8 mL/min (by C-G formula based on SCr of 0.75 mg/dL).   Medical History: Past Medical History:  Diagnosis Date  . Acquired deafness   . Cervical cancer ([place]) 1075  . Cervical cancer ([place])   . Diabetes mellitus without complication ([place])   . Dyspareunia   . Heart murmur   . Hodgkin disease ([place]) 1987  . Ovarian cancer [facility])    age 64  . Thyroid disease     Medications:  Scheduled:  . aspirin  81 mg Oral Daily  . ferrous sulfate  325 mg Oral Q breakfast  . folic acid  1 mg Oral Daily  . insulin aspart  0-9 Units Subcutaneous TID WC  . levothyroxine  175 mcg Oral QAC breakfast  . metoprolol succinate  25 mg Oral Daily  . ondansetron (ZOFRAN) IV  4 mg Intravenous Once  . pantoprazole  40 mg Oral Daily  . rosuvastatin  10 mg Oral Daily    Assessment: Patient is a 64 yof that presented to the ED with chest pain. The patient does have a hx of cancer and recent COVID diagnosis. The patient was fount to have a PE and Pharmacy has been asked to dose heparin.   Heparin level this AM is therapeutic at 0.45  Goal of Therapy:  Heparin level 0.3-0.7 units/ml Monitor platelets by anticoagulation protocol: Yes   Plan:   -Continue heparin at 1200 units/h -Recheck confirmatory heparin level in 6h   Jeanne Sims, PharmD, BCPS, FNKF Clinical Pharmacist [place] Please utilize Amion for appropriate phone number to reach the unit pharmacist ([place])   02/21/2019

## 2019-02-21 NOTE — Progress Notes (Signed)
ANTICOAGULATION CONSULT NOTE  Pharmacy Consult for Heparin Indication: pulmonary embolus  No Known Allergies  Patient Measurements: Height: 5\' 6"  (167.6 cm) Weight: 191 lb (86.6 kg) IBW/kg (Calculated) : 59.3 Heparin Dosing Weight: 77.9 kg  Vital Signs: Temp: 98.7 F (37.1 C) (02/15 1540) Temp Source: Oral (02/15 1540) BP: 120/50 (02/15 1540) Pulse Rate: 77 (02/15 1540)  Labs: Recent Labs    02/20/19 1113 02/20/19 1217 02/20/19 1350 02/20/19 2106 02/21/19 0240 02/21/19 1054 02/21/19 1657  HGB 14.3  --   --   --  12.8  --   --   HCT 43.5  --   --   --  38.6  --   --   PLT 415*  --   --   --  433*  --   --   HEPARINUNFRC  --   --   --  0.90*  --  0.45 0.37  CREATININE  --  0.56  --   --  0.75  --   --   TROPONINIHS 3  --  3  --  5  --   --     Estimated Creatinine Clearance: 79.8 mL/min (by C-G formula based on SCr of 0.75 mg/dL).   Medical History: Past Medical History:  Diagnosis Date  . Acquired deafness   . Cervical cancer ([place]) 1075  . Cervical cancer ([place])   . Diabetes mellitus without complication ([place])   . Dyspareunia   . Heart murmur   . Hodgkin disease ([place]) 1987  . Ovarian cancer [facility])    age 9  . Thyroid disease     Medications:  Scheduled:  . aspirin  81 mg Oral Daily  . ferrous sulfate  325 mg Oral Q breakfast  . folic acid  1 mg Oral Daily  . insulin aspart  0-9 Units Subcutaneous TID WC  . levothyroxine  175 mcg Oral QAC breakfast  . metoprolol succinate  12.5 mg Oral Daily  . ondansetron (ZOFRAN) IV  4 mg Intravenous Once  . pantoprazole  40 mg Oral Daily  . rosuvastatin  10 mg Oral Daily    Assessment: Patient is a 64 yof that presented to the ED with chest pain. The patient does have a hx of cancer and recent COVID diagnosis. The patient was found to have a PE and Pharmacy consulted to dose heparin.   Heparin level this AM was therapeutic.  This PM the second, confirmatory heparin level remains therapeutic at 0.37 on heparin  drip 1200 units/hr.    Goal of Therapy:  Heparin level 0.3-0.7 units/ml Monitor platelets by anticoagulation protocol: Yes   Plan:  -Continue heparin at 1200 units/h -Daily HL, CBC  Thank you for allowing pharmacy to be part of this patients care team.  Nicole Cella, [place] Please utilize Amion for appropriate phone number to reach the unit pharmacist ([place]) 02/21/2019

## 2019-02-21 NOTE — Progress Notes (Signed)
  Echocardiogram 2D Echocardiogram has been performed.  Jeanne Sims Jeanne Sims 02/21/2019, 10:52 AM

## 2019-02-22 ENCOUNTER — Inpatient Hospital Stay (HOSPITAL_COMMUNITY): Payer: Commercial Managed Care - PPO

## 2019-02-22 DIAGNOSIS — U071 COVID-19: Secondary | ICD-10-CM

## 2019-02-22 LAB — CBC
HCT: 36.8 % (ref 36.0–46.0)
Hemoglobin: 12 g/dL (ref 12.0–15.0)
MCH: 31 pg (ref 26.0–34.0)
MCHC: 32.6 g/dL (ref 30.0–36.0)
MCV: 95.1 fL (ref 80.0–100.0)
Platelets: 435 10*3/uL — ABNORMAL HIGH (ref 150–400)
RBC: 3.87 MIL/uL (ref 3.87–5.11)
RDW: 12.8 % (ref 11.5–15.5)
WBC: 21.2 10*3/uL — ABNORMAL HIGH (ref 4.0–10.5)
nRBC: 0 % (ref 0.0–0.2)

## 2019-02-22 LAB — GLUCOSE, CAPILLARY
Glucose-Capillary: 108 mg/dL — ABNORMAL HIGH (ref 70–99)
Glucose-Capillary: 105 mg/dL — ABNORMAL HIGH (ref 70–99)
Glucose-Capillary: 110 mg/dL — ABNORMAL HIGH (ref 70–99)
Glucose-Capillary: 187 mg/dL — ABNORMAL HIGH (ref 70–99)
Glucose-Capillary: 109 mg/dL — ABNORMAL HIGH (ref 70–99)
Glucose-Capillary: 155 mg/dL — ABNORMAL HIGH (ref 70–99)

## 2019-02-22 LAB — BASIC METABOLIC PANEL
Anion gap: 8 (ref 5–15)
BUN: 6 mg/dL — ABNORMAL LOW (ref 8–23)
CO2: 24 mmol/L (ref 22–32)
Calcium: 8.2 mg/dL — ABNORMAL LOW (ref 8.9–10.3)
Chloride: 105 mmol/L (ref 98–111)
Creatinine, Ser: 0.65 mg/dL (ref 0.44–1.00)
GFR calc Af Amer: >60 mL/min (ref >60)
GFR calc non Af Amer: >60 mL/min (ref >60)
Glucose, Bld: 144 mg/dL — ABNORMAL HIGH (ref 70–99)
Potassium: 3.9 mmol/L (ref 3.5–5.1)
Sodium: 137 mmol/L (ref 135–145)

## 2019-02-22 LAB — HEPARIN LEVEL (UNFRACTIONATED)
Heparin Unfractionated: 0.28 IU/mL — ABNORMAL LOW (ref 0.30–0.70)
Heparin Unfractionated: 0.52 IU/mL (ref 0.30–0.70)

## 2019-02-22 MED ORDER — IOHEXOL 350 MG/ML SOLN
100.0000 mL | Freq: Once | INTRAVENOUS | Status: AC | PRN
  Administered 2019-02-22: 17:00:00 50 mL via INTRAVENOUS

## 2019-02-22 MED ORDER — APIXABAN 5 MG PO TABS
10.0000 mg | ORAL_TABLET | Freq: Two times a day (BID) | ORAL | Status: DC
  Administered 2019-02-23 – 2019-02-24 (×3): 10 mg via ORAL
  Filled 2019-02-22 (×3): qty 2

## 2019-02-22 MED ORDER — LORATADINE 10 MG PO TABS
10.0000 mg | ORAL_TABLET | Freq: Every day | ORAL | Status: DC
  Administered 2019-02-22 – 2019-02-24 (×3): 10 mg via ORAL
  Filled 2019-02-22 (×3): qty 1

## 2019-02-22 MED ORDER — FLUTICASONE PROPIONATE 50 MCG/ACT NA SUSP
2.0000 | Freq: Every day | NASAL | Status: DC
  Administered 2019-02-22 – 2019-02-24 (×3): 2 via NASAL
  Filled 2019-02-22: qty 16

## 2019-02-22 MED ORDER — APIXABAN 5 MG PO TABS: 5.0000 mg | ORAL_TABLET | Freq: Two times a day (BID) | ORAL | Status: DC

## 2019-02-22 NOTE — Progress Notes (Signed)
Daughter, [name], called and updated. Discussed possible discharge tomorrow and answered questions regarding care.

## 2019-02-22 NOTE — Progress Notes (Addendum)
PROGRESS NOTE                                                                                                                                                                                                             Patient Demographics:    Jeanne Sims, is a 64 y.o. female, DOB - 08-Jan-1955, YKZ:[id number]  Admit date - 02/20/2019   Admitting Physician Rise Patience, MD  Outpatient Primary MD for the patient is Hague, Rosalyn Charters, MD  LOS - 2   Chief Complaint  Patient presents with  . Shortness of Breath    COVID+       Brief Narrative     Jeanne Sims is a 64 y.o. female with history of ovarian cancer, cervical cancer, lymphoma in remission and chronic leukocytosis thrombocytosis, mitral valve prolapse with palpitation on metoprolol, hyperlipidemia hypothyroidism diabetes mellitus was diagnosed with Covid infection around the last week of January about 3 weeks ago.  At that time patient had some headaches and upper respiratory symptoms and diarrhea.  Had resolved by February 5 and had gone back to work around February 8.  Last 2 days patient started getting increasing shortness of breath with minimal exertion with left-sided pleuritic chest pain.  Denies any fever or chills and no further diarrhea.  In the ER patient was hemodynamically stable CT angiogram shows bilateral pulmonary embolism with no strain pattern.  EKG shows normal sinus rhythm nonspecific ST-T changes.  Labs show WBC of 23.2 CRP is presently around 6.5 high sensitive troponin 5 procalcitonin less than 0.1 and D-dimer 1.1.  Patient was started on heparin and admitted for further management of acute pulmonary embolism.  Right lower extremity DVT study was negative for DVT.   Subjective:    Jeanne Sims today denies any fever or chills, reports pleuritic chest pain significantly improved .  He does report some sinus his pain today.   Assessment  & Plan :    Principal  Problem:   Acute pulmonary embolism without acute cor pulmonale (HCC) Active Problems:   HLD (hyperlipidemia)   Controlled type 2 diabetes mellitus with hyperglycemia (HCC)   Hypothyroidism   Acute pulmonary embolism ([place])   COVID-19 virus infection  Acute pulmonary embolism  - involving lungs bilaterally with no strain pattern presently hemodynamically stable likely provoked by recent COVID-19 infection, as  well she is using estrogen patch. -Patient is on estrogen patch, I have asked her to stop using. -  Patient is on heparin GTT, continue for 48 hours, and transition to oral Eliquis tomorrow . -  2D echo with no evidence of right heart strain. -  troponins negative x3  COVID-19 infection presently - inflammatory markers elevated but CT scan as per the radiologist reading does not show any definite evidence of any infiltrates and patient is not hypoxic.  Will closely monitor.  Chronic leukocytosis/thrombocytosis --patient has a chronic history of leukocytosis being followed by oncologist.  Discussed with Dr. Narda Rutherford, he will arrange for outpatient follow-up.  Diabetes mellitus type 2 - will keep patient on sliding scale coverage.  Patient reports some sinus congestion, pain bilaterally, will start on Claritin and Flonase for now, she does report she has recurrent bacterial sinusitis 5-6 times every year, but so far her nasal discharge is clear, if becomes purulent then likely will need antibiotics.  History of mitral valve prolapse and palpitation metoprolol.  Hyperlipidemia on statins.  Hypothyroidism on Synthroid.  History of Hodgkin's lymphoma status post radiation, ovarian and cervical cancer in remission.  Addendum at 6:00 PM: Patient started to complain of right lower extremity tingling numbness, in the lateral aspect thigh, on exam she appears to be having 3-4 motor in the right lower extremity, onset around 3:30 PM, CTA head and neck was obtained, neurology were  consulted, discussed finding with neurology, there is no acute ischemic event or large vessel occlusion as discussed with neurology on prelim readings , patient can have MRI done given she has a cochlear implant , will consult PT/OT, heparin GTT was held temporarily pending imaging, heparin GTT can be resumed, I have called daughter and updated her about findings.    COVID-19 Labs  Recent Labs    02/21/19 0240  DDIMER 1.11*  FERRITIN 79  CRP 6.5*    Lab Results  Component Value Date   SARSCOV2NAA POSITIVE (A) 02/20/2019     Code Status : Full  Family Communication  : D/W daughter 02/22/2019, will update her again tomorrow about findings from neuro consultation  Disposition Plan  : Home  Barriers For Discharge : remains on Heparin GTT, likely transition to Eliquis tomorrow.  Consults  : Discussed with oncology via phone  Procedures  : None  DVT Prophylaxis  :  Heparin GTT  Lab Results  Component Value Date   PLT 435 (H) 02/22/2019    Antibiotics  :    Anti-infectives (From admission, onward)   None        Objective:   Vitals:   02/21/19 1540 02/21/19 2000 02/22/19 0400 02/22/19 0800  BP: (!) 120/50 (!) 121/56 (!) 123/56 (!) 110/58  Pulse: 77 79 79 81  Resp: 15 14 16 14   Temp: 98.7 F (37.1 C) 98.8 F (37.1 C) 98.1 F (36.7 C)   TempSrc: Oral Oral Oral   SpO2: 96% 90% 93% 96%  Weight:      Height:        Wt Readings from Last 3 Encounters:  02/20/19 86.6 kg  11/17/18 87.2 kg  10/27/18 87 kg     Intake/Output Summary (Last 24 hours) at 02/22/2019 1229 Last data filed at 02/22/2019 0900 Gross per 24 hour  Intake 2252.38 ml  Output --  Net 2252.38 ml     Physical Exam  Awake Alert, Oriented X 3, No new F.N deficits, Normal affect Symmetrical Chest wall movement, Good air  movement bilaterally, CTAB RRR,No Gallops,Rubs or new Murmurs, No Parasternal Heave +ve B.Sounds, Abd Soft, No tenderness, No rebound - guarding or rigidity. No Cyanosis,  Clubbing or edema, No new Rash or bruise       Data Review:    CBC Recent Labs  Lab 02/20/19 1113 02/21/19 0240 02/22/19 0350  WBC 23.2* 23.2* 21.2*  HGB 14.3 12.8 12.0  HCT 43.5 38.6 36.8  PLT 415* 433* 435*  MCV 95.2 94.4 95.1  MCH 31.3 31.3 31.0  MCHC 32.9 33.2 32.6  RDW 13.5 13.1 12.8  LYMPHSABS 5.8* 6.0*  --   MONOABS 2.5* 2.6*  --   EOSABS 0.2 0.4  --   BASOSABS 0.2* 0.2*  --     Chemistries  Recent Labs  Lab 02/20/19 1217 02/21/19 0240 02/22/19 0350  NA 137 140 137  K 3.9 3.9 3.9  CL 105 104 105  CO2 23 26 24   GLUCOSE 151* 137* 144*  BUN 12 10 6*  CREATININE 0.56 0.75 0.65  CALCIUM 8.9 8.7* 8.2*  AST 16 15  --   ALT 18 17  --   ALKPHOS 58 61  --   BILITOT 0.7 0.6  --    ------------------------------------------------------------------------------------------------------------------ No results for input(s): CHOL, HDL, LDLCALC, TRIG, CHOLHDL, LDLDIRECT in the last 72 hours.  No results found for: HGBA1C ------------------------------------------------------------------------------------------------------------------ No results for input(s): TSH, T4TOTAL, T3FREE, THYROIDAB in the last 72 hours.  Invalid input(s): FREET3 ------------------------------------------------------------------------------------------------------------------ Recent Labs    02/21/19 0240  FERRITIN 79    Coagulation profile No results for input(s): INR, PROTIME in the last 168 hours.  Recent Labs    02/21/19 0240  DDIMER 1.11*    Cardiac Enzymes No results for input(s): CKMB, TROPONINI, MYOGLOBIN in the last 168 hours.  Invalid input(s): CK ------------------------------------------------------------------------------------------------------------------ No results found for: BNP  Inpatient Medications  Scheduled Meds: . aspirin  81 mg Oral Daily  . ferrous sulfate  325 mg Oral Q breakfast  . folic acid  1 mg Oral Daily  . insulin aspart  0-9 Units  Subcutaneous TID WC  . levothyroxine  175 mcg Oral QAC breakfast  . metoprolol succinate  12.5 mg Oral Daily  . ondansetron (ZOFRAN) IV  4 mg Intravenous Once  . pantoprazole  40 mg Oral Daily  . rosuvastatin  10 mg Oral Daily   Continuous Infusions: . sodium chloride 10 mL/hr at 02/20/19 1552  . sodium chloride 75 mL/hr at 02/21/19 1116  . heparin 1,350 Units/hr (02/22/19 0910)   PRN Meds:.sodium chloride, acetaminophen **OR** acetaminophen, morphine injection, ondansetron **OR** ondansetron (ZOFRAN) IV  Micro Results Recent Results (from the past 240 hour(s))  Respiratory Panel by RT PCR (Flu A&B, Covid) - Nasopharyngeal Swab     Status: Abnormal   Collection Time: 02/20/19  4:01 PM   Specimen: Nasopharyngeal Swab  Result Value Ref Range Status   SARS Coronavirus 2 by RT PCR POSITIVE (A) NEGATIVE Final    Comment: RESULT CALLED TO, READ BACK BY AND VERIFIED WITH: GOUGE S TN 1702 [id number] PHILLIPS C (NOTE) SARS-CoV-2 target nucleic acids are DETECTED. SARS-CoV-2 RNA is generally detectable in upper respiratory specimens  during the acute phase of infection. Positive results are indicative of the presence of the identified virus, but do not rule out bacterial infection or co-infection with other pathogens not detected by the test. Clinical correlation with patient history and other diagnostic information is necessary to determine patient infection status. The expected result is Negative. Fact Sheet for Patients:  PinkCheek.be Fact Sheet for Healthcare Providers: GravelBags.it This test is not yet approved or cleared by the [place] FDA and  has been authorized for detection and/or diagnosis of SARS-CoV-2 by FDA under an Emergency Use Authorization (EUA).  This EUA will remain in effect (meaning this test can be used) for  the duration of  the COVID-19 declaration under Section 564(b)(1) of the Act, 21 U.S.C. section  360bbb-3(b)(1), unless the authorization is terminated or revoked sooner.    Influenza A by PCR NEGATIVE NEGATIVE Final   Influenza B by PCR NEGATIVE NEGATIVE Final    Comment: (NOTE) The Xpert Xpress SARS-CoV-2/FLU/RSV assay is intended as an aid in  the diagnosis of influenza from Nasopharyngeal swab specimens and  should not be used as a sole basis for treatment. Nasal washings and  aspirates are unacceptable for Xpert Xpress SARS-CoV-2/FLU/RSV  testing. Fact Sheet for Patients: PinkCheek.be Fact Sheet for Healthcare Providers: GravelBags.it This test is not yet approved or cleared by the [place] FDA and  has been authorized for detection and/or diagnosis of SARS-CoV-2 by  FDA under an Emergency Use Authorization (EUA). This EUA will remain  in effect (meaning this test can be used) for the duration of the  Covid-19 declaration under Section 564(b)(1) of the Act, 21  U.S.C. section 360bbb-3(b)(1), unless the authorization is  terminated or revoked. Performed at [facility], [address], [place], [place] [address]     Radiology Reports CT Angio Chest PE W and/or Wo Contrast  Result Date: 02/20/2019 CLINICAL DATA:  Pleuritic chest pain with shortness of breath and tachypnea. Current COVID infection. New right leg pain. History of non-Hodgkin's lymphoma, cervical cancer, ovarian cancer. EXAM: CT ANGIOGRAPHY CHEST WITH CONTRAST TECHNIQUE: Multidetector CT imaging of the chest was performed using the standard protocol during bolus administration of intravenous contrast. Multiplanar CT image reconstructions and MIPs were obtained to evaluate the vascular anatomy. CONTRAST:  59mL OMNIPAQUE IOHEXOL 350 MG/ML SOLN COMPARISON:  CT chest 11/10/2018. FINDINGS: Cardiovascular: Filling defect is identified in the right middle lobe medial and lateral segmental pulmonary arteries, in the anterior basal and medial basal  right lower lobe segmental pulmonary arteries, and in a subsegmental pulmonary artery in the left lower lobe (image 207/6). No lobar are larger thrombus is identified, and as result, this case does not qualify for submassive pulmonary embolus, which necessitates lobar or larger thrombus based on our current protocols. Coronary, aortic arch, and branch vessel atherosclerotic vascular disease. Overall heart size within normal limits. 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 Although the patient has a current SARS-CoV-2 infection, we do not demonstrate the typical patchy ground-glass opacities of active COVID pneumonia. Trace left pleural effusion. Upper Abdomen: Stable 0.9 cm hypodense lesion in segment 4 of the liver on image 91/4, previously characterized as a cyst. 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 Review of the MIP images confirms the above findings. IMPRESSION: 1. Acute segmental pulmonary emboli in the right middle lobe and right lower. Acute subsegmental embolus in the left lower lobe. Lobe overall clot burden is small. No lobar or larger thrombus is identified, accordingly this does not qualify as submassive pulmonary embolus based on current protocols. 2. Trace left pleural effusion. 3. Coronary, aortic arch, and branch vessel atherosclerotic vascular  disease. Aortic Atherosclerosis (ICD10-I70.0). 4. 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 No lung opacities to suggest active pneumonia related to the patient's current SARS-CoV-2 infection. 5. 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 6. Stable hypodense lesion in segment 4 of the liver, previously characterized as a cyst. Critical Value/emergent results were called by telephone at the time of interpretation on 02/20/2019 at 2:05 pm to provider Dr. Marda Stalker , who verbally acknowledged these results. Electronically Signed   By: Van Clines M.D.   On: 02/20/2019 14:28   US Venous Img Lower Unilateral Right  Result Date: 02/20/2019 CLINICAL DATA:  Right calf cramping. EXAM: RIGHT LOWER EXTREMITY VENOUS DOPPLER ULTRASOUND TECHNIQUE: Gray-scale sonography with graded compression, as well as color Doppler and duplex ultrasound were performed to evaluate the lower extremity deep venous systems from the level of the common femoral vein and including the common femoral, femoral, profunda femoral, popliteal and calf veins including the posterior tibial, peroneal and gastrocnemius veins when visible. The superficial great saphenous vein was also interrogated. Spectral Doppler was utilized to evaluate flow at rest and with distal augmentation maneuvers in the common femoral, femoral and popliteal veins. COMPARISON:  None. FINDINGS: Contralateral Common Femoral Vein: Respiratory phasicity is normal and symmetric with the symptomatic side. No evidence of thrombus. Normal compressibility. Common Femoral Vein: No evidence of thrombus. Normal compressibility, respiratory phasicity and response to augmentation. Saphenofemoral Junction: No evidence of thrombus. Normal compressibility and flow on color Doppler imaging. Profunda Femoral Vein: No evidence of thrombus. Normal compressibility and flow on color Doppler imaging. Femoral Vein: No evidence of thrombus. Normal compressibility, respiratory phasicity and response to augmentation. Popliteal Vein: No evidence of thrombus. Normal compressibility, respiratory phasicity and response to augmentation. Calf Veins: No evidence of thrombus. Normal compressibility and flow  on color Doppler imaging. Superficial Great Saphenous Vein: No evidence of thrombus. Normal compressibility. Venous Reflux:  None. Other Findings: No evidence of superficial thrombophlebitis or abnormal fluid collection. IMPRESSION: No evidence of right lower extremity deep venous thrombosis. Electronically Signed   By: Aletta Edouard M.D.   On: 02/20/2019 13:21   ECHOCARDIOGRAM LIMITED  Result Date: 02/21/2019    ECHOCARDIOGRAM LIMITED REPORT   Patient Name:   TENNYSON KALLEN [facility] Date of Exam: 02/21/2019 Medical Rec #:  [id number]       Height:       66.0 in Accession #:    [id number]      Weight:       191.0 lb Date of Birth:  10-Oct-1955        BSA:          1.96 m Patient Age:    75 years        BP:           114/49 mmHg Patient Gender: F               HR:           77 bpm. Exam Location:  Inpatient Procedure: Limited Echo, Cardiac Doppler and Limited Color Doppler Indications:    I26.02 Pulmonary embolus  History:        Patient has no prior history of Echocardiogram examinations.                 Signs/Symptoms:Dyspnea and Murmur; Risk Factors:Diabetes. COVID  Positive. Cancer. Thyroid Disease.  Sonographer:    Tiffany Dance Referring Phys: [place]  Sonographer Comments: No subcostal window. IMPRESSIONS  1. Left ventricular ejection fraction, by estimation, is 60 to 65%. The left ventricle has normal function. The left ventricle has no regional wall motion abnormalities. Left ventricular diastolic function could not be evaluated.  2. Right ventricular systolic function is normal. The right ventricular size is normal.  3. The mitral valve is degenerative. Trivial mitral valve regurgitation.  4. The aortic valve is tricuspid. Aortic valve regurgitation is mild. Mild aortic valve stenosis. Comparison(s): No prior Echocardiogram. FINDINGS  Left Ventricle: Left ventricular ejection fraction, by estimation, is 60 to 65%. The left ventricle has normal function. The left ventricle has no  regional wall motion abnormalities. The left ventricular internal cavity size was normal in size. There is  no left ventricular hypertrophy. The left ventricular diastology could not be evaluated due to mitral annular calcification (moderate or greater). Right Ventricle: The right ventricular size is normal. No increase in right ventricular wall thickness. Right ventricular systolic function is normal. Left Atrium: Left atrial size was normal in size. Right Atrium: Right atrial size was normal in size. Pericardium: There is no evidence of pericardial effusion. Mitral Valve: The mitral valve is degenerative in appearance. Moderate mitral annular calcification. Trivial mitral valve regurgitation. Tricuspid Valve: The tricuspid valve is grossly normal. Tricuspid valve regurgitation is mild. Aortic Valve: The aortic valve is tricuspid. . There is moderate thickening and moderate calcification of the aortic valve. Aortic valve regurgitation is mild. Aortic regurgitation PHT measures 298 msec. Mild aortic stenosis is present. There is moderate  thickening of the aortic valve. There is moderate calcification of the aortic valve. Aortic valve mean gradient measures 11.3 mmHg. Aortic valve peak gradient measures 21.2 mmHg. Aortic valve area, by VTI measures 1.10 cm. Pulmonic Valve: The pulmonic valve was grossly normal. Pulmonic valve regurgitation is mild. Aorta: The aortic root is normal in size and structure. Venous: The inferior vena cava was not well visualized. IAS/Shunts: No atrial level shunt detected by color flow Doppler.  LEFT VENTRICLE PLAX 2D LVIDd:         3.93 cm LVIDs:         2.90 cm LV PW:         0.90 cm LV IVS:        0.86 cm LVOT diam:     2.00 cm LV SV:         54.98 ml LV SV Index:   17.15 LVOT Area:     3.14 cm  RIGHT VENTRICLE RV Basal diam:  2.73 cm TAPSE (M-mode): 2.7 cm LEFT ATRIUM             Index       RIGHT ATRIUM           Index LA diam:        3.70 cm 1.89 cm/m  RA Area:     14.60 cm LA  Vol (A2C):   61.4 ml 31.31 ml/m RA Volume:   33.90 ml  17.29 ml/m LA Vol (A4C):   46.9 ml 23.92 ml/m LA Biplane Vol: 55.0 ml 28.05 ml/m  AORTIC VALVE AV Area (Vmax):    1.08 cm AV Area (Vmean):   1.08 cm AV Area (VTI):     1.10 cm AV Vmax:           230.00 cm/s AV Vmean:  158.333 cm/s AV VTI:            0.500 m AV Peak Grad:      21.2 mmHg AV Mean Grad:      11.3 mmHg LVOT Vmax:         79.10 cm/s LVOT Vmean:        54.200 cm/s LVOT VTI:          0.175 m LVOT/AV VTI ratio: 0.35 AI PHT:            298 msec  AORTA Ao Root diam: 3.20 cm Ao Asc diam:  2.70 cm MITRAL VALVE                TRICUSPID VALVE MV Area (PHT): 3.72 cm     TR Peak grad:   29.4 mmHg MV Decel Time: 204 msec     TR Vmax:        271.00 cm/s MV E velocity: 115.00 cm/s MV A velocity: 122.00 cm/s  SHUNTS MV E/A ratio:  0.94         Systemic VTI:  0.18 m                             Systemic Diam: 2.00 cm Eleonore Chiquito MD Electronically signed by Eleonore Chiquito MD Signature Date/Time: 02/21/2019/1:19:24 PM    Final       Phillips Climes M.D on 02/22/2019 at 12:29 PM  Between 7am to 7pm - Pager - [phone]  After 7pm go to [email or web address] - password [facility]  [facility] -  Office  [phone]

## 2019-02-22 NOTE — Care Management (Signed)
Per [facility].. Eliquis 2.5 mg. and 5 mg. bid  for a 30 day supply.  No PA required No deductible Tier 2 [facility].mail order: 90 day supply twice a day co-pay ;$100.00.  (Eliquis only comes in 2.5mg  and 5mg .)

## 2019-02-22 NOTE — Progress Notes (Signed)
Patient complained of sudden tingling, numbness, and decreased sensation in the right leg, not precipitated by any activity. Patient able to lift leg and walk but has noticeable weakness in that leg and required use of a walker when ambulating to bathroom. MD notified who further assessed patient and ordered stat CT. Will continue to monitor.

## 2019-02-22 NOTE — Progress Notes (Addendum)
ANTICOAGULATION CONSULT NOTE  Pharmacy Consult for Heparin Indication: pulmonary embolus  No Known Allergies  Patient Measurements: Height: 5\' 6"  (167.6 cm) Weight: 191 lb (86.6 kg) IBW/kg (Calculated) : 59.3 Heparin Dosing Weight: 77.9 kg  Vital Signs: Temp: 98.1 F (36.7 C) (02/16 0400) Temp Source: Oral (02/16 0400) BP: 110/58 (02/16 0800) Pulse Rate: 81 (02/16 0800)  Labs: Recent Labs     0000 02/20/19 1113 02/20/19 1217 02/20/19 1350 02/20/19 2106 02/21/19 0240 02/21/19 1054 02/21/19 1657 02/22/19 0350  HGB   < > 14.3  --   --   --  12.8  --   --  12.0  HCT  --  43.5  --   --   --  38.6  --   --  36.8  PLT  --  415*  --   --   --  433*  --   --  435*  HEPARINUNFRC  --   --   --   --    < >  --  0.45 0.37 0.28*  CREATININE  --   --  0.56  --   --  0.75  --   --  0.65  TROPONINIHS  --  3  --  3  --  5  --   --   --    < > = values in this interval not displayed.    Estimated Creatinine Clearance: 79.8 mL/min (by C-G formula based on SCr of 0.65 mg/dL).   Medical History: Past Medical History:  Diagnosis Date  . Acquired deafness   . Cervical cancer ([place]) 1075  . Cervical cancer ([place])   . Diabetes mellitus without complication ([place])   . Dyspareunia   . Heart murmur   . Hodgkin disease ([place]) 1987  . Ovarian cancer [facility])    age 11  . Thyroid disease     Medications:  Scheduled:  . aspirin  81 mg Oral Daily  . ferrous sulfate  325 mg Oral Q breakfast  . folic acid  1 mg Oral Daily  . insulin aspart  0-9 Units Subcutaneous TID WC  . levothyroxine  175 mcg Oral QAC breakfast  . metoprolol succinate  12.5 mg Oral Daily  . ondansetron (ZOFRAN) IV  4 mg Intravenous Once  . pantoprazole  40 mg Oral Daily  . rosuvastatin  10 mg Oral Daily    Assessment: Patient is a 72 yof that presented to the ED with chest pain. The patient does have a hx of cancer and recent COVID diagnosis. The patient was found to have a PE and Pharmacy consulted to dose heparin.    Heparin level this AM was subtherapeutic at 0.28. No issues with the infusion per RN  Goal of Therapy:  Heparin level 0.3-0.7 units/ml Monitor platelets by anticoagulation protocol: Yes   Plan:  -Increase heparin to 1350 units/h - 6hr HL -Daily HL, CBC  Jeanne Sims A. Levada Dy, PharmD, BCPS, FNKF Clinical Pharmacist [place] Please utilize Amion for appropriate phone number to reach the unit pharmacist ([place])   Addendum: Per MD, patient to initiate apixaban in AM 2/17 for PE. Will continue heparin drip until time of first dose at 1000 on 2/17 at which point we will d/c heparin and continue apixaban 10mg  PO BID x 7 days and then 5mg  PO BID thereafter.   Carlissa Pesola A. Levada Dy, PharmD, BCPS, FNKF Clinical Pharmacist [place] Please utilize Amion for appropriate phone number to reach the unit pharmacist ([place])  02/22/2019  

## 2019-02-22 NOTE — Progress Notes (Signed)
ANTICOAGULATION CONSULT NOTE  Pharmacy Consult for Heparin Indication: pulmonary embolus  No Known Allergies  Patient Measurements: Height: 5\' 6"  (167.6 cm) Weight: 191 lb (86.6 kg) IBW/kg (Calculated) : 59.3 Heparin Dosing Weight: 77.9 kg  Vital Signs: Temp: 98.5 F (36.9 C) (02/16 1349) Temp Source: Oral (02/16 1349) BP: 107/52 (02/16 1349) Pulse Rate: 75 (02/16 1349)  Labs: Recent Labs     0000 02/20/19 1113 02/20/19 1217 02/20/19 1350 02/20/19 2106 02/21/19 0240 02/21/19 1054 02/21/19 1657 02/22/19 0350 02/22/19 1427  HGB   < > 14.3  --   --   --  12.8  --   --  12.0  --   HCT  --  43.5  --   --   --  38.6  --   --  36.8  --   PLT  --  415*  --   --   --  433*  --   --  435*  --   HEPARINUNFRC  --   --   --   --    < >  --    < > 0.37 0.28* 0.52  CREATININE  --   --  0.56  --   --  0.75  --   --  0.65  --   TROPONINIHS  --  3  --  3  --  5  --   --   --   --    < > = values in this interval not displayed.    Estimated Creatinine Clearance: 79.8 mL/min (by C-G formula based on SCr of 0.65 mg/dL).   Medical History: Past Medical History:  Diagnosis Date  . Acquired deafness   . Cervical cancer ([place]) 1075  . Cervical cancer ([place])   . Diabetes mellitus without complication ([place])   . Dyspareunia   . Heart murmur   . Hodgkin disease ([place]) 1987  . Ovarian cancer [facility])    age 69  . Thyroid disease     Medications:  Scheduled:  . [START ON 02/23/2019] apixaban  10 mg Oral BID   Followed by  . [START ON 03/02/2019] apixaban  5 mg Oral BID  . aspirin  81 mg Oral Daily  . ferrous sulfate  325 mg Oral Q breakfast  . fluticasone  2 spray Each Nare Daily  . folic acid  1 mg Oral Daily  . insulin aspart  0-9 Units Subcutaneous TID WC  . levothyroxine  175 mcg Oral QAC breakfast  . loratadine  10 mg Oral Daily  . metoprolol succinate  12.5 mg Oral Daily  . ondansetron (ZOFRAN) IV  4 mg Intravenous Once  . pantoprazole  40 mg Oral Daily  . rosuvastatin  10  mg Oral Daily    Assessment: Patient is a 64 yof that presented to the ED with chest pain. The patient does have a hx of cancer and recent COVID diagnosis. The patient was found to have a PE and Pharmacy consulted to dose heparin.   Heparin level this afternoon was therapeutic at 0.52. No issues with the infusion per RN  Patient to transition to apixaban in AM. Will continue heparin until then.   Goal of Therapy:  Heparin level 0.3-0.7 units/ml Monitor platelets by anticoagulation protocol: Yes   Plan:  -Continue heparin at 1350 units/h -Daily HL, CBC Transition to apixaban at 1000 on 2/17.   Otilio Groleau A. Levada Dy, PharmD, BCPS, FNKF Clinical Pharmacist [place] Please utilize Amion for appropriate phone number to reach  the unit pharmacist ([place])    02/22/2019

## 2019-02-23 DIAGNOSIS — E1165 Type 2 diabetes mellitus with hyperglycemia: Secondary | ICD-10-CM

## 2019-02-23 DIAGNOSIS — I2699 Other pulmonary embolism without acute cor pulmonale: Principal | ICD-10-CM

## 2019-02-23 DIAGNOSIS — E039 Hypothyroidism, unspecified: Secondary | ICD-10-CM

## 2019-02-23 DIAGNOSIS — I639 Cerebral infarction, unspecified: Secondary | ICD-10-CM

## 2019-02-23 DIAGNOSIS — E785 Hyperlipidemia, unspecified: Secondary | ICD-10-CM

## 2019-02-23 LAB — CBC
HCT: 35 % — ABNORMAL LOW (ref 36.0–46.0)
Hemoglobin: 11.4 g/dL — ABNORMAL LOW (ref 12.0–15.0)
MCH: 31 pg (ref 26.0–34.0)
MCHC: 32.6 g/dL (ref 30.0–36.0)
MCV: 95.1 fL (ref 80.0–100.0)
Platelets: 448 10*3/uL — ABNORMAL HIGH (ref 150–400)
RBC: 3.68 MIL/uL — ABNORMAL LOW (ref 3.87–5.11)
RDW: 13 % (ref 11.5–15.5)
WBC: 16.7 10*3/uL — ABNORMAL HIGH (ref 4.0–10.5)
nRBC: 0 % (ref 0.0–0.2)

## 2019-02-23 LAB — BASIC METABOLIC PANEL
Anion gap: 7 (ref 5–15)
BUN: 5 mg/dL — ABNORMAL LOW (ref 8–23)
CO2: 24 mmol/L (ref 22–32)
Calcium: 8.2 mg/dL — ABNORMAL LOW (ref 8.9–10.3)
Chloride: 109 mmol/L (ref 98–111)
Creatinine, Ser: 0.62 mg/dL (ref 0.44–1.00)
GFR calc Af Amer: >60 mL/min (ref >60)
GFR calc non Af Amer: >60 mL/min (ref >60)
Glucose, Bld: 137 mg/dL — ABNORMAL HIGH (ref 70–99)
Potassium: 4 mmol/L (ref 3.5–5.1)
Sodium: 140 mmol/L (ref 135–145)

## 2019-02-23 LAB — GLUCOSE, CAPILLARY
Glucose-Capillary: 128 mg/dL — ABNORMAL HIGH (ref 70–99)
Glucose-Capillary: 113 mg/dL — ABNORMAL HIGH (ref 70–99)
Glucose-Capillary: 116 mg/dL — ABNORMAL HIGH (ref 70–99)
Glucose-Capillary: 151 mg/dL — ABNORMAL HIGH (ref 70–99)

## 2019-02-23 LAB — HEPARIN LEVEL (UNFRACTIONATED): Heparin Unfractionated: 0.36 IU/mL (ref 0.30–0.70)

## 2019-02-23 MED ORDER — POLYETHYLENE GLYCOL 3350 17 G PO PACK
17.0000 g | PACK | Freq: Every day | ORAL | Status: DC | PRN
  Administered 2019-02-23: 22:00:00 17 g via ORAL
  Filled 2019-02-23: qty 1

## 2019-02-23 MED ORDER — HYDROCORTISONE 1 % EX CREA
TOPICAL_CREAM | Freq: Three times a day (TID) | CUTANEOUS | Status: DC | PRN
  Filled 2019-02-23: qty 28

## 2019-02-23 NOTE — Progress Notes (Addendum)
Writer trying to reach patient's family with no success. Both patient's daughter and her spouse were called with no answer. Patient verbalized that she talked already with her family.

## 2019-02-23 NOTE — Discharge Instructions (Signed)
Information on my medicine - ELIQUIS (apixaban)  This medication education was reviewed with me or my healthcare representative as part of my discharge preparation.   Why was Eliquis prescribed for you? Eliquis was prescribed to treat blood clots that may have been found in the veins of your legs (deep vein thrombosis) or in your lungs (pulmonary embolism) and to reduce the risk of them occurring again.  What do You need to know about Eliquis ? The starting dose is 10 mg (two 5 mg tablets) taken TWICE daily for the FIRST SEVEN (7) DAYS, then on 03/02/2019  the dose is reduced to ONE 5 mg tablet taken TWICE daily.  Eliquis may be taken with or without food.   Try to take the dose about the same time in the morning and in the evening. If you have difficulty swallowing the tablet whole please discuss with your pharmacist how to take the medication safely.  Take Eliquis exactly as prescribed and DO NOT stop taking Eliquis without talking to the doctor who prescribed the medication.  Stopping may increase your risk of developing a new blood clot.  Refill your prescription before you run out.  After discharge, you should have regular check-up appointments with your healthcare provider that is prescribing your Eliquis.    What do you do if you miss a dose? If a dose of ELIQUIS is not taken at the scheduled time, take it as soon as possible on the same day and twice-daily administration should be resumed. The dose should not be doubled to make up for a missed dose.  Important Safety Information A possible side effect of Eliquis is bleeding. You should call your healthcare provider right away if you experience any of the following: ? Bleeding from an injury or your nose that does not stop. ? Unusual colored urine (red or dark brown) or unusual colored stools (red or black). ? Unusual bruising for unknown reasons. ? A serious fall or if you hit your head (even if there is no bleeding).  Some  medicines may interact with Eliquis and might increase your risk of bleeding or clotting while on Eliquis. To help avoid this, consult your healthcare provider or pharmacist prior to using any new prescription or non-prescription medications, including herbals, vitamins, non-steroidal anti-inflammatory drugs (NSAIDs) and supplements.  This website has more information on Eliquis (apixaban): http://[email or web address].com/eliquis/home

## 2019-02-23 NOTE — Evaluation (Signed)
Physical Therapy Evaluation Patient Details Name: Jeanne Sims MRN: [id number] DOB: 1955-07-02 Today's Date: 02/23/2019   History of Present Illness  Jeanne Sims is a 64 y.o. female with history of ovarian cancer, cervical cancer, lymphoma in remission and chronic leukocytosis thrombocytosis, mitral valve prolapse with palpitation on metoprolol, hyperlipidemia hypothyroidism diabetes mellitus was diagnosed with Covid infection around the last week of January about 3 weeks ago.  At that time patient had some headaches and upper respiratory symptoms and diarrhea.  Had resolved by February 5 and had gone back to work around February 8.  Last 2 days patient started getting increasing shortness of breath with minimal exertion with left-sided pleuritic chest pain. Pt found to have B PE. No evidence of DVT  Clinical Impression  Pt admitted with above diagnosis. Pt able to ambulate without device with supervision and good balance overall. She reports some tingling in her her right LE however she is functioning well.  Pt does not want a RW and does not want HHPT.  Will follow while in hospital .   Pt currently with functional limitations due to the deficits listed below (see PT Problem List). Pt will benefit from skilled PT to increase their independence and safety with mobility to allow discharge to the venue listed below.      Follow Up Recommendations Home health PT;Supervision - Intermittent(pt states she doesnt need HHPT but safety eval may help)    Equipment Recommendations  Rolling walker with 5" wheels    Recommendations for Other Services       Precautions / Restrictions Precautions Precautions: Fall Restrictions Weight Bearing Restrictions: No      Mobility  Bed Mobility Overal bed mobility: Independent                Transfers Overall transfer level: Independent                  Ambulation/Gait Ambulation/Gait assistance: Min guard;Supervision Gait Distance  (Feet): 450 Feet Assistive device: None Gait Pattern/deviations: Step-through pattern;Decreased stride length   Gait velocity interpretation: <1.31 ft/sec, indicative of household ambulator General Gait Details: Overall pt did not need any stabilizing for gait.  Pt able to ambulate on unit.  Pt did state she used RW yesterday and it steadies her but her house is small and she doesnt want one. She feels she is weaker and has tingling in right LE but she feels she is close to her baseline.   Stairs            Wheelchair Mobility    Modified Rankin (Stroke Patients Only) Modified Rankin (Stroke Patients Only) Pre-Morbid Rankin Score: No symptoms Modified Rankin: Moderate disability     Balance Overall balance assessment: Needs assistance Sitting-balance support: No upper extremity supported;Feet supported Sitting balance-Leahy Scale: Fair     Standing balance support: No upper extremity supported;During functional activity Standing balance-Leahy Scale: Fair                               Pertinent Vitals/Pain Pain Assessment: Faces Faces Pain Scale: Hurts little more Pain Location: tingling sensation Pain Descriptors / Indicators: Tingling Pain Intervention(s): Limited activity within patient's tolerance;Monitored during session;Repositioned    Home Living Family/patient expects to be discharged to:: Private residence Living Arrangements: Spouse/significant other Available Help at Discharge: Family;Available 24 hours/day(disabled husband) Type of Home: House Home Access: Stairs to enter Entrance Stairs-Rails: Right;Left;Can reach both Entrance Stairs-Number of Steps:  3 Home Layout: One level Home Equipment: Cane - single point Additional Comments: Works full time    Prior Function Level of Independence: Independent               Hand Dominance   Dominant Hand: Right    Extremity/Trunk Assessment   Upper Extremity Assessment Upper Extremity  Assessment: Defer to OT evaluation    Lower Extremity Assessment Lower Extremity Assessment: RLE deficits/detail RLE Sensation: decreased proprioception;decreased light touch    Cervical / Trunk Assessment Cervical / Trunk Assessment: Normal  Communication   Communication: No difficulties  Cognition Arousal/Alertness: Awake/alert Behavior During Therapy: WFL for tasks assessed/performed Overall Cognitive Status: Within Functional Limits for tasks assessed                                        General Comments General comments (skin integrity, edema, etc.): 82-90  bpm, 96% RA    Exercises     Assessment/Plan    PT Assessment Patient needs continued PT services  PT Problem List Decreased mobility;Decreased balance;Decreased activity tolerance;Decreased knowledge of use of DME;Decreased safety awareness;Decreased knowledge of precautions;Decreased strength       PT Treatment Interventions DME instruction;Gait training;Functional mobility training;Therapeutic activities;Therapeutic exercise;Balance training;Patient/family education;Stair training    PT Goals (Current goals can be found in the Care Plan section)  Acute Rehab PT Goals Patient Stated Goal: to  go home PT Goal Formulation: With patient Time For Goal Achievement: 03/09/19 Potential to Achieve Goals: Good    Frequency Min 3X/week   Barriers to discharge        Co-evaluation               AM-PAC PT "6 Clicks" Mobility  Outcome Measure Help needed turning from your back to your side while in a flat bed without using bedrails?: None Help needed moving from lying on your back to sitting on the side of a flat bed without using bedrails?: None Help needed moving to and from a bed to a chair (including a wheelchair)?: None Help needed standing up from a chair using your arms (e.g., wheelchair or bedside chair)?: None Help needed to walk in hospital room?: None Help needed climbing 3-5  steps with a railing? : A Little 6 Click Score: 23    End of Session Equipment Utilized During Treatment: Gait belt Activity Tolerance: Patient limited by fatigue Patient left: in chair;with call bell/phone within reach;with chair alarm set Nurse Communication: Mobility status PT Visit Diagnosis: Muscle weakness (generalized) (M62.81)    Time: 2423-5361 PT Time Calculation (min) (ACUTE ONLY): 14 min   Charges:   PT Evaluation $PT Eval Moderate Complexity: 1 Mod          Charly Holcomb W,PT Acute Rehabilitation Services Pager:  [phone]  Office:  [phone]    Denice Paradise 02/23/2019, 2:05 PM

## 2019-02-23 NOTE — Progress Notes (Signed)
Heparin drip was stopped and Eliquis was given to patient per order. Will continue to monitor.

## 2019-02-23 NOTE — Progress Notes (Signed)
OT Evaluation  Clinical Impression: PTA, pt was Independent with ADLs, IADLs, and was working full-time. Presently, pt with mild R-sided weakness (4-/5 R UE vs 4+/5 L UE). Pt is R-hand dominant. Pt vitals WFL, on RA throughout session. Pt able to complete sit-to-stand, stand pivot, and mobility without AD in room at Supervision level for safety. No major LOB noted while maneuvering. No physical assistance needed for ADLs assessed including LB dressing (doffing/donning socks), toilet transfer, or opening drink containers. OT indicated while pt in acute setting for establishment of HEP to improve strength of dominant R UE. HHOT may be recommended for evaluation to ensure safety in the home (tub/shower setup and transfer), but prolonged OT services not indicated. Will continue to follow acutely until pt DC.   02/23/19 1030  OT Visit Information  Last OT Received On 02/23/19  Assistance Needed +1  History of Present Illness Jeanne Sims is a 64 y.o. female with history of ovarian cancer, cervical cancer, lymphoma in remission and chronic leukocytosis thrombocytosis, mitral valve prolapse with palpitation on metoprolol, hyperlipidemia hypothyroidism diabetes mellitus was diagnosed with Covid infection around the last week of January about 3 weeks ago.  At that time patient had some headaches and upper respiratory symptoms and diarrhea.  Had resolved by February 5 and had gone back to work around February 8.  Last 2 days patient started getting increasing shortness of breath with minimal exertion with left-sided pleuritic chest pain. Pt found to have B PE. No evidence of DVT  Precautions  Precautions Other (comment) (Airborne)  Restrictions  Weight Bearing Restrictions No  Home Living  Family/patient expects to be discharged to: Private residence  Living Arrangements Spouse/significant other  Available Help at Discharge Family;Available 24 hours/day  Type of [place] to enter   Entrance Stairs-Number of Steps 3  Entrance Stairs-Rails Right;Left;Can reach both  Home Layout One level  Bathroom Shower/Tub Tub/shower unit  Research officer, trade union - single point  Additional Comments Works full time  Prior Function  Level of Environmental consultant No difficulties  Pain Assessment  Pain Assessment Faces  Faces Pain Scale 4  Pain Location tingling sensation  Pain Descriptors / Indicators Tingling  Pain Intervention(s) Monitored during session  Cognition  Arousal/Alertness Awake/alert  Behavior During Therapy WFL for tasks assessed/performed  Overall Cognitive Status Within Functional Limits for tasks assessed  Upper Extremity Assessment  Upper Extremity Assessment RUE deficits/detail  RUE Deficits / Details Decreased R shoulder strength at 4-/5 grossly in comparison to L shoulder  RUE Sensation WNL  RUE Coordination WNL  Lower Extremity Assessment  Lower Extremity Assessment Defer to PT evaluation  ADL  Overall ADL's  Needs assistance/impaired  Eating/Feeding Independent;Sitting  Grooming Set up;Standing  Upper Body Bathing Set up;Sitting  Lower Body Bathing Supervison/ safety;Sit to/from stand  Upper Body Dressing  Set up;Sitting  Lower Body Dressing Supervision/safety;Sit to/from stand;Sitting/lateral Data processing manager and Hygiene Supervision/safety;Sit to/from stand  Functional mobility during ADLs Supervision/safety  General ADL Comments Pt presents with no physical assistance needed for ADLs. Pt able to walk to bathroom without AD at Supervision level, complete toilet transfer, etc. no major LOB noted during tasks.  Vision- History  Baseline Vision/History Wears glasses  Bed Mobility  Overal bed mobility Independent  General bed mobility comments Received in recliner chair  Transfers  Overall transfer level Needs  assistance  Equipment used None  Transfers  Sit to/from [employer] to Stand Independent  Stand pivot transfers Supervision (Supervision solely to ensure safety)  General Comments  General comments (skin integrity, edema, etc.) Vitals WNL during session, pt on RA  OT - End of Session  Equipment Utilized During Treatment Other (comment) (None)  Activity Tolerance Patient tolerated treatment well  Patient left in chair;with call bell/phone within reach  OT Assessment  OT Recommendation/Assessment Patient needs continued OT Services  OT Visit Diagnosis Muscle weakness (generalized) (M62.81)  OT Problem List Decreased strength  OT Plan  OT Frequency (ACUTE ONLY) Min 2X/week  OT Treatment/Interventions (ACUTE ONLY) Self-care/ADL training;Therapeutic exercise;Energy conservation;Therapeutic activities;Patient/family education  AM-PAC OT "6 Clicks" Daily Activity Outcome Measure (Version 2)  Help from another person eating meals? 4  Help from another person taking care of personal grooming? 3  Help from another person toileting, which includes using toliet, bedpan, or urinal? 3  Help from another person bathing (including washing, rinsing, drying)? 3  Help from another person to put on and taking off regular upper body clothing? 3  Help from another person to put on and taking off regular lower body clothing? 3  6 Click Score 19  OT Recommendation  Follow Up Recommendations Home health OT  OT Equipment Other (comment) (To be determined)  Individuals Consulted  Consulted and Agree with Results and Recommendations Patient  Acute Rehab OT Goals  Patient Stated Goal to go home  OT Goal Formulation With patient  Time For Goal Achievement 03/09/19  Potential to Achieve Goals Good  OT Time Calculation  OT Start Time (ACUTE ONLY) 1058  OT Stop Time (ACUTE ONLY) 1119  OT Time Calculation (min) 21 min  OT General Charges  $OT Visit 1 Visit  OT Evaluation  $OT Eval  Moderate Complexity 1 Mod  Written Expression  Dominant Hand Right

## 2019-02-23 NOTE — Progress Notes (Signed)
   02/23/19 1100  PT Recommendation  Follow Up Recommendations Home health PT;Supervision - Intermittent (pt states she doesnt need HHPT but safety eval may help)  PT equipment Rolling walker with 5" wheels (pt declines RW)  Full note to follow.   Chayil Gantt W,PT Acute Rehabilitation Services Pager:  [phone]  Office:  [phone]

## 2019-02-23 NOTE — Progress Notes (Signed)
Patient does not want husband or daughter called for update at this time. Patient states there is no change in status. Patient verbalizes understanding of plan. Patient pleasant and cooperative with assessment and meds. Patient does not want HS snack. Patient repositions self. Patient resting in bed at this time, bed alarm on and call light within reach. Will continue to monitor.

## 2019-02-23 NOTE — Progress Notes (Addendum)
PROGRESS NOTE                                                                                                                                                                                                             Patient Demographics:    Jeanne Sims, is a 64 y.o. female, DOB - [birth year], NAT:[id number]  Outpatient Primary MD for the patient is Bonnita Nasuti, MD   Admit date - 02/20/2019   LOS - 3  Chief Complaint  Patient presents with  . Shortness of Breath    COVID+       Brief Narrative: Patient is a 64 y.o. female with PMHx of ovarian/cervical cancer-lymphoma in remission-chronic leukocytosis, thrombocytosis, hypothyroidism-DM-2-known covid + since late January- presented with shortness of breath-found to have  pulmonary embolism.   Subjective:    Jeanne Sims today was sitting at bedside-she continues to complain of numbness in the right lateral thigh area that apparently started suddenly yesterday.  Thinks her right lower extremity is somewhat weak   Assessment  & Plan :   Pulmonary embolism: Improved-no clinical features of RV strain-echo without RV strain.  Initially on IV heparin-has been transitioned to Eliquis.  Lower extremity Dopplers negative for DVT.  Covid 19 infection: Not hypoxic-appears stable-suspect shortness of breath was from pulmonary embolism.  Not on steroids/remdesivir   Right lower extremity tingling and numbness: Occurred suddenly on 2/16-CTA head/neck without any acute abnormalities.  Unable to perform MRI brain as patient has a cochlear implant.  Patient claims that she is slightly weak in the right lower extremity-has very subtle right lower extremity weakness on exam-not sure if had a CVA that is not seen on CT or this is from neuropathy.  Awaiting formal consult by neurology today.  Ambulate with physical therapy to see how she does.  Check A1c and lipid panel with a.m. labs.  Chronic  leukocytosis/thrombocytosis: Stable-follow-up with outpatient oncologist  History of MVP/palpitations: On metoprolol  HLD: Continue statin  Hypothyroidism: Continue Synthroid  History of Hodgkin's lymphoma s/p radiation, history of cervical/ovarian cancer:  in remission-stable for follow-up with oncology as an outpatient  Hearing loss-s/p cochlear implant  Obesity: Estimated body mass index is 30.83 kg/m as calculated from the following:   Height as of this encounter: 5\' 6"  (1.676 m).   Weight as of this encounter: 86.6 kg.  DVT Prophylaxis  : Eliquis  Consults  :  Neuro  Procedures  :  None  ABG: No results found for: PHART, PCO2ART, PO2ART, HCO3, TCO2, ACIDBASEDEF, O2SAT  Vent Settings: N/A  Condition - Stable  Family Communication  : Daughter updated over the phone on 2/17  Code Status :  Full Code  Diet :  Diet Order            Diet heart healthy/carb modified Room service appropriate? Yes; Fluid consistency: Thin  Diet effective now               Disposition Plan  :  Remain hospitalized-hopefully home with home health services in the next day or so.  Barriers to discharge: Await neurology consultation regarding sudden onset of right lateral thigh numbness-may need further stroke work-up.  Antimicorbials  :    Anti-infectives (From admission, onward)   None      Inpatient Medications  Scheduled Meds: . apixaban  10 mg Oral BID   Followed by  . [START ON 03/02/2019] apixaban  5 mg Oral BID  . aspirin  81 mg Oral Daily  . ferrous sulfate  325 mg Oral Q breakfast  . fluticasone  2 spray Each Nare Daily  . folic acid  1 mg Oral Daily  . insulin aspart  0-9 Units Subcutaneous TID WC  . levothyroxine  175 mcg Oral QAC breakfast  . loratadine  10 mg Oral Daily  . metoprolol succinate  12.5 mg Oral Daily  . ondansetron (ZOFRAN) IV  4 mg Intravenous Once  . pantoprazole  40 mg Oral Daily  . rosuvastatin  10 mg Oral Daily   Continuous Infusions: .  sodium chloride 10 mL/hr at 02/20/19 1552  . sodium chloride 1,000 mL (02/23/19 1249)   PRN Meds:.sodium chloride, acetaminophen **OR** acetaminophen, morphine injection, ondansetron **OR** ondansetron (ZOFRAN) IV   Time Spent in minutes  25  See all Orders from today for further details   Oren Binet M.D on 02/23/2019 at 2:03 PM  To page go to [email or web address] - use universal password  [facility] -  Office  [phone]    Objective:   Vitals:   02/22/19 1349 02/22/19 1600 02/22/19 2220 02/23/19 0900  BP: (!) 107/52 (!) 113/47 (!) 115/51   Pulse: 75 73 81   Resp: 18 18 (!) 21   Temp: 98.5 F (36.9 C)  98.8 F (37.1 C)   TempSrc: Oral  Oral   SpO2: 93% 97% 90% 95%  Weight:      Height:        Wt Readings from Last 3 Encounters:  02/20/19 86.6 kg  11/17/18 87.2 kg  10/27/18 87 kg     Intake/Output Summary (Last 24 hours) at 02/23/2019 1403 Last data filed at 02/23/2019 1249 Gross per 24 hour  Intake 1927.41 ml  Output --  Net 1927.41 ml     Physical Exam Gen Exam:Alert awake-not in any distress HEENT:atraumatic, normocephalic Chest: B/L clear to auscultation anteriorly CVS:S1S2 regular Abdomen:soft non tender, non distended Extremities:no edema Neurology: Very mild right lower extremity weakness Skin: no rash   Data Review:    CBC Recent Labs  Lab 02/20/19 1113 02/21/19 0240 02/22/19 0350 02/23/19 0506  WBC 23.2* 23.2* 21.2* 16.7*  HGB 14.3 12.8 12.0 11.4*  HCT 43.5 38.6 36.8 35.0*  PLT 415* 433* 435* 448*  MCV 95.2 94.4 95.1 95.1  MCH 31.3 31.3 31.0 31.0  MCHC 32.9 33.2 32.6 32.6  RDW 13.5 13.1 12.8  13.0  LYMPHSABS 5.8* 6.0*  --   --   MONOABS 2.5* 2.6*  --   --   EOSABS 0.2 0.4  --   --   BASOSABS 0.2* 0.2*  --   --     Chemistries  Recent Labs  Lab 02/20/19 1217 02/21/19 0240 02/22/19 0350 02/23/19 0506  NA 137 140 137 140  K 3.9 3.9 3.9 4.0  CL 105 104 105 109  CO2 23 26 24 24   GLUCOSE 151* 137* 144* 137*  BUN 12  10 6* 5*  CREATININE 0.56 0.75 0.65 0.62  CALCIUM 8.9 8.7* 8.2* 8.2*  AST 16 15  --   --   ALT 18 17  --   --   ALKPHOS 58 61  --   --   BILITOT 0.7 0.6  --   --    ------------------------------------------------------------------------------------------------------------------ No results for input(s): CHOL, HDL, LDLCALC, TRIG, CHOLHDL, LDLDIRECT in the last 72 hours.  No results found for: HGBA1C ------------------------------------------------------------------------------------------------------------------ No results for input(s): TSH, T4TOTAL, T3FREE, THYROIDAB in the last 72 hours.  Invalid input(s): FREET3 ------------------------------------------------------------------------------------------------------------------ Recent Labs    02/21/19 0240  FERRITIN 79    Coagulation profile No results for input(s): INR, PROTIME in the last 168 hours.  Recent Labs    02/21/19 0240  DDIMER 1.11*    Cardiac Enzymes No results for input(s): CKMB, TROPONINI, MYOGLOBIN in the last 168 hours.  Invalid input(s): CK ------------------------------------------------------------------------------------------------------------------ No results found for: BNP  Micro Results Recent Results (from the past 240 hour(s))  Respiratory Panel by RT PCR (Flu A&B, Covid) - Nasopharyngeal Swab     Status: Abnormal   Collection Time: 02/20/19  4:01 PM   Specimen: Nasopharyngeal Swab  Result Value Ref Range Status   SARS Coronavirus 2 by RT PCR POSITIVE (A) NEGATIVE Final    Comment: RESULT CALLED TO, READ BACK BY AND VERIFIED WITH: GOUGE S TN 1702 [id number] PHILLIPS C (NOTE) SARS-CoV-2 target nucleic acids are DETECTED. SARS-CoV-2 RNA is generally detectable in upper respiratory specimens  during the acute phase of infection. Positive results are indicative of the presence of the identified virus, but do not rule out bacterial infection or co-infection with other pathogens not detected by  the test. Clinical correlation with patient history and other diagnostic information is necessary to determine patient infection status. The expected result is Negative. Fact Sheet for Patients:  PinkCheek.be Fact Sheet for Healthcare Providers: GravelBags.it This test is not yet approved or cleared by the [place] FDA and  has been authorized for detection and/or diagnosis of SARS-CoV-2 by FDA under an Emergency Use Authorization (EUA).  This EUA will remain in effect (meaning this test can be used) for  the duration of  the COVID-19 declaration under Section 564(b)(1) of the Act, 21 U.S.C. section 360bbb-3(b)(1), unless the authorization is terminated or revoked sooner.    Influenza A by PCR NEGATIVE NEGATIVE Final   Influenza B by PCR NEGATIVE NEGATIVE Final    Comment: (NOTE) The Xpert Xpress SARS-CoV-2/FLU/RSV assay is intended as an aid in  the diagnosis of influenza from Nasopharyngeal swab specimens and  should not be used as a sole basis for treatment. Nasal washings and  aspirates are unacceptable for Xpert Xpress SARS-CoV-2/FLU/RSV  testing. Fact Sheet for Patients: PinkCheek.be Fact Sheet for Healthcare Providers: GravelBags.it This test is not yet approved or cleared by the [place] FDA and  has been authorized for detection and/or diagnosis of SARS-CoV-2 by  FDA under an Emergency  Use Authorization (EUA). This EUA will remain  in effect (meaning this test can be used) for the duration of the  Covid-19 declaration under Section 564(b)(1) of the Act, 21  U.S.C. section 360bbb-3(b)(1), unless the authorization is  terminated or revoked. Performed at [facility], [address] [place], [place] [address]     Radiology Reports CT ANGIO HEAD W OR WO CONTRAST  Result Date: 02/22/2019 CLINICAL DATA:  Right lower extremity sudden  weakness, on heparin drip; stroke/TIA, assess intracranial arteries. EXAM: CT ANGIOGRAPHY HEAD AND NECK TECHNIQUE: Multidetector CT imaging of the head and neck was performed using the standard protocol during bolus administration of intravenous contrast. Multiplanar CT image reconstructions and MIPs were obtained to evaluate the vascular anatomy. Carotid stenosis measurements (when applicable) are obtained utilizing NASCET criteria, using the distal internal carotid diameter as the denominator. CONTRAST:  85mL OMNIPAQUE IOHEXOL 350 MG/ML SOLN COMPARISON:  Report from head CT 12/07/2013 (images unavailable) FINDINGS: CT HEAD FINDINGS Brain: Streak artifact from a left-sided cochlear implant obscures portions of the left cerebral hemisphere, limiting evaluation. There is no evidence of acute intracranial hemorrhage or demarcated cortical infarction. No evidence of intracranial mass. No midline shift or extra-axial fluid collection. Mild generalized parenchymal atrophy Vascular: Reported separately. Skull: Normal. Negative for fracture or focal lesion. Sinuses: Minimal mucosal thickening within the left sphenoid sinus. Orbits: Visualized orbits demonstrate no acute abnormality. Other: Sequela of previous left mastoidectomy. No significant mastoid effusion. Review of the MIP images confirms the above findings CTA NECK FINDINGS Aortic arch: Standard branching. Scattered calcified plaque within the visualized aortic arch. No significant innominate or proximal subclavian stenosis. Right carotid system: CCA and ICA patent within the neck without measurable stenosis. Mild mixed plaque within the proximal ICA. Left carotid system: CCA and ICA patent within the neck without measurable stenosis. Mild mixed plaque within the proximal ICA. Vertebral arteries: Left vertebral artery dominant. The vertebral arteries are patent within the neck without stenosis. Skeleton: No acute bony abnormality. Other neck: No neck mass or  cervical lymphadenopathy. The thyroid gland is atrophic or surgically absent. Upper chest: Linear atelectasis and/or scarring within the left lung apex. Partially visualized small left pleural effusion. Review of the MIP images confirms the above findings CTA HEAD FINDINGS Anterior circulation: The intracranial internal carotid arteries are patent with scattered calcified plaque but no significant stenosis. The M1 middle cerebral arteries are patent bilaterally without significant stenosis. No M2 proximal branch occlusion or high-grade proximal stenosis is identified. The anterior cerebral arteries are patent bilaterally without high-grade proximal stenosis. The A1 left ACA is slightly hypoplastic. No intracranial aneurysm is identified. Posterior circulation: The non dominant intracranial right vertebral artery is developmentally diminutive beyond the origin of the right PICA, although patent. The dominant intracranial left vertebral artery is patent without significant stenosis, as is the basilar artery. The posterior cerebral arteries are patent bilaterally without significant proximal stenosis. Posterior communicating arteries are poorly delineated and may be hypoplastic or absent bilaterally. Venous sinuses: Within limitations of contrast timing, no convincing thrombus. Anatomic variants: As described Review of the MIP images confirms the above findings IMPRESSION: CT head: 1. 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 2. Within this limitation, no evidence of acute intracranial abnormality. 3. Mild generalized parenchymal atrophy. CTA neck: The bilateral common carotid, internal carotid and vertebral arteries are patent within the neck without measurable stenosis. Mild atherosclerotic plaque within the visualized aortic arch and carotid  systems. CTA head: 1. No intracranial large vessel occlusion or proximal high-grade arterial  stenosis. 2. Mild calcified plaque within the intracranial internal carotid arteries without stenosis. Electronically Signed   By: Kellie Simmering DO   On: 02/22/2019 17:59   CT ANGIO NECK W OR WO CONTRAST  Result Date: 02/22/2019 CLINICAL DATA:  Right lower extremity sudden weakness, on heparin drip; stroke/TIA, assess intracranial arteries. EXAM: CT ANGIOGRAPHY HEAD AND NECK TECHNIQUE: Multidetector CT imaging of the head and neck was performed using the standard protocol during bolus administration of intravenous contrast. Multiplanar CT image reconstructions and MIPs were obtained to evaluate the vascular anatomy. Carotid stenosis measurements (when applicable) are obtained utilizing NASCET criteria, using the distal internal carotid diameter as the denominator. CONTRAST:  3mL OMNIPAQUE IOHEXOL 350 MG/ML SOLN COMPARISON:  Report from head CT 12/07/2013 (images unavailable) FINDINGS: CT HEAD FINDINGS Brain: Streak artifact from a left-sided cochlear implant obscures portions of the left cerebral hemisphere, limiting evaluation. There is no evidence of acute intracranial hemorrhage or demarcated cortical infarction. No evidence of intracranial mass. No midline shift or extra-axial fluid collection. Mild generalized parenchymal atrophy Vascular: Reported separately. Skull: Normal. Negative for fracture or focal lesion. Sinuses: Minimal mucosal thickening within the left sphenoid sinus. Orbits: Visualized orbits demonstrate no acute abnormality. Other: Sequela of previous left mastoidectomy. No significant mastoid effusion. Review of the MIP images confirms the above findings CTA NECK FINDINGS Aortic arch: Standard branching. Scattered calcified plaque within the visualized aortic arch. No significant innominate or proximal subclavian stenosis. Right carotid system: CCA and ICA patent within the neck without measurable stenosis. Mild mixed plaque within the proximal ICA. Left carotid system: CCA and ICA patent  within the neck without measurable stenosis. Mild mixed plaque within the proximal ICA. Vertebral arteries: Left vertebral artery dominant. The vertebral arteries are patent within the neck without stenosis. Skeleton: No acute bony abnormality. Other neck: No neck mass or cervical lymphadenopathy. The thyroid gland is atrophic or surgically absent. Upper chest: Linear atelectasis and/or scarring within the left lung apex. Partially visualized small left pleural effusion. Review of the MIP images confirms the above findings CTA HEAD FINDINGS Anterior circulation: The intracranial internal carotid arteries are patent with scattered calcified plaque but no significant stenosis. The M1 middle cerebral arteries are patent bilaterally without significant stenosis. No M2 proximal branch occlusion or high-grade proximal stenosis is identified. The anterior cerebral arteries are patent bilaterally without high-grade proximal stenosis. The A1 left ACA is slightly hypoplastic. No intracranial aneurysm is identified. Posterior circulation: The non dominant intracranial right vertebral artery is developmentally diminutive beyond the origin of the right PICA, although patent. The dominant intracranial left vertebral artery is patent without significant stenosis, as is the basilar artery. The posterior cerebral arteries are patent bilaterally without significant proximal stenosis. Posterior communicating arteries are poorly delineated and may be hypoplastic or absent bilaterally. Venous sinuses: Within limitations of contrast timing, no convincing thrombus. Anatomic variants: As described Review of the MIP images confirms the above findings IMPRESSION: CT head: 1. 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 2. Within this limitation, no evidence of acute intracranial abnormality. 3. Mild generalized parenchymal atrophy. CTA neck: The bilateral common  carotid, internal carotid and vertebral arteries are patent within the neck without measurable stenosis. Mild atherosclerotic plaque within the visualized aortic arch and carotid systems. CTA head: 1. No intracranial large vessel occlusion or proximal high-grade arterial stenosis. 2. Mild calcified plaque within the  intracranial internal carotid arteries without stenosis. Electronically Signed   By: Kellie Simmering DO   On: 02/22/2019 17:59   CT Angio Chest PE W and/or Wo Contrast  Result Date: 02/20/2019 CLINICAL DATA:  Pleuritic chest pain with shortness of breath and tachypnea. Current COVID infection. New right leg pain. History of non-Hodgkin's lymphoma, cervical cancer, ovarian cancer. EXAM: CT ANGIOGRAPHY CHEST WITH CONTRAST TECHNIQUE: Multidetector CT imaging of the chest was performed using the standard protocol during bolus administration of intravenous contrast. Multiplanar CT image reconstructions and MIPs were obtained to evaluate the vascular anatomy. CONTRAST:  86mL OMNIPAQUE IOHEXOL 350 MG/ML SOLN COMPARISON:  CT chest 11/10/2018. FINDINGS: Cardiovascular: Filling defect is identified in the right middle lobe medial and lateral segmental pulmonary arteries, in the anterior basal and medial basal right lower lobe segmental pulmonary arteries, and in a subsegmental pulmonary artery in the left lower lobe (image 207/6). No lobar are larger thrombus is identified, and as result, this case does not qualify for submassive pulmonary embolus, which necessitates lobar or larger thrombus based on our current protocols. Coronary, aortic arch, and branch vessel atherosclerotic vascular disease. Overall heart size within normal limits. 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 Although the patient has a current SARS-CoV-2 infection, we do not demonstrate the typical patchy  ground-glass opacities of active COVID pneumonia. Trace left pleural effusion. Upper Abdomen: Stable 0.9 cm hypodense lesion in segment 4 of the liver on image 91/4, previously characterized as a cyst. 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 Review of the MIP images confirms the above findings. IMPRESSION: 1. Acute segmental pulmonary emboli in the right middle lobe and right lower. Acute subsegmental embolus in the left lower lobe. Lobe overall clot burden is small. No lobar or larger thrombus is identified, accordingly this does not qualify as submassive pulmonary embolus based on current protocols. 2. Trace left pleural effusion. 3. Coronary, aortic arch, and branch vessel atherosclerotic vascular disease. Aortic Atherosclerosis (ICD10-I70.0). 4. 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 No lung opacities to suggest active pneumonia related to the patient's current SARS-CoV-2 infection. 5. 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 6. Stable hypodense lesion in segment 4 of the liver, previously characterized as a cyst. Critical Value/emergent results were called by telephone at the time of interpretation on 02/20/2019 at 2:05 pm to provider Dr. Marda Stalker , who verbally acknowledged these results. Electronically Signed   By: Van Clines M.D.   On: 02/20/2019 14:28   US Venous Img Lower Unilateral Right  Result Date: 02/20/2019 CLINICAL DATA:  Right calf cramping. EXAM: RIGHT LOWER EXTREMITY VENOUS DOPPLER ULTRASOUND TECHNIQUE: Gray-scale sonography with graded compression, as well as color Doppler and duplex ultrasound were performed to evaluate the lower extremity deep venous  systems from the level of the common femoral vein and including the common femoral, femoral, profunda femoral, popliteal and calf veins including the posterior tibial, peroneal and gastrocnemius veins when visible. The superficial great saphenous vein was also interrogated. Spectral Doppler was utilized to evaluate flow at rest and with distal augmentation maneuvers in the common femoral, femoral and popliteal veins. COMPARISON:  None. FINDINGS: Contralateral Common Femoral Vein:  Respiratory phasicity is normal and symmetric with the symptomatic side. No evidence of thrombus. Normal compressibility. Common Femoral Vein: No evidence of thrombus. Normal compressibility, respiratory phasicity and response to augmentation. Saphenofemoral Junction: No evidence of thrombus. Normal compressibility and flow on color Doppler imaging. Profunda Femoral Vein: No evidence of thrombus. Normal compressibility and flow on color Doppler imaging. Femoral Vein: No evidence of thrombus. Normal compressibility, respiratory phasicity and response to augmentation. Popliteal Vein: No evidence of thrombus. Normal compressibility, respiratory phasicity and response to augmentation. Calf Veins: No evidence of thrombus. Normal compressibility and flow on color Doppler imaging. Superficial Great Saphenous Vein: No evidence of thrombus. Normal compressibility. Venous Reflux:  None. Other Findings: No evidence of superficial thrombophlebitis or abnormal fluid collection. IMPRESSION: No evidence of right lower extremity deep venous thrombosis. Electronically Signed   By: Aletta Edouard M.D.   On: 02/20/2019 13:21   ECHOCARDIOGRAM LIMITED  Result Date: 02/21/2019    ECHOCARDIOGRAM LIMITED REPORT   Patient Name:   JAVAEH MUSCATELLO [facility] Date of Exam: 02/21/2019 Medical Rec #:  [id number]       Height:       66.0 in Accession #:    [id number]      Weight:       191.0 lb Date of Birth:  27-Oct-1955        BSA:          1.96 m Patient Age:    35 years         BP:           114/49 mmHg Patient Gender: F               HR:           77 bpm. Exam Location:  Inpatient Procedure: Limited Echo, Cardiac Doppler and Limited Color Doppler Indications:    I26.02 Pulmonary embolus  History:        Patient has no prior history of Echocardiogram examinations.                 Signs/Symptoms:Dyspnea and Murmur; Risk Factors:Diabetes. COVID                 Positive. Cancer. Thyroid Disease.  Sonographer:    Tiffany Dance Referring Phys: [place]  Sonographer Comments: No subcostal window. IMPRESSIONS  1. Left ventricular ejection fraction, by estimation, is 60 to 65%. The left ventricle has normal function. The left ventricle has no regional wall motion abnormalities. Left ventricular diastolic function could not be evaluated.  2. Right ventricular systolic function is normal. The right ventricular size is normal.  3. The mitral valve is degenerative. Trivial mitral valve regurgitation.  4. The aortic valve is tricuspid. Aortic valve regurgitation is mild. Mild aortic valve stenosis. Comparison(s): No prior Echocardiogram. FINDINGS  Left Ventricle: Left ventricular ejection fraction, by estimation, is 60 to 65%. The left ventricle has normal function. The left ventricle has no regional wall motion abnormalities. The left ventricular internal cavity size was normal in size. There is  no left ventricular hypertrophy. The left ventricular diastology could not be evaluated due to mitral annular calcification (moderate or greater). Right Ventricle: The right ventricular size is normal. No increase in right ventricular wall thickness. Right ventricular systolic function is normal. Left Atrium: Left atrial size was normal in size. Right Atrium: Right atrial size was normal in size. Pericardium: There is no evidence of pericardial effusion. Mitral Valve: The mitral valve is degenerative in appearance. Moderate mitral annular  calcification. Trivial mitral valve regurgitation.  Tricuspid Valve: The tricuspid valve is grossly normal. Tricuspid valve regurgitation is mild. Aortic Valve: The aortic valve is tricuspid. . There is moderate thickening and moderate calcification of the aortic valve. Aortic valve regurgitation is mild. Aortic regurgitation PHT measures 298 msec. Mild aortic stenosis is present. There is moderate  thickening of the aortic valve. There is moderate calcification of the aortic valve. Aortic valve mean gradient measures 11.3 mmHg. Aortic valve peak gradient measures 21.2 mmHg. Aortic valve area, by VTI measures 1.10 cm. Pulmonic Valve: The pulmonic valve was grossly normal. Pulmonic valve regurgitation is mild. Aorta: The aortic root is normal in size and structure. Venous: The inferior vena cava was not well visualized. IAS/Shunts: No atrial level shunt detected by color flow Doppler.  LEFT VENTRICLE PLAX 2D LVIDd:         3.93 cm LVIDs:         2.90 cm LV PW:         0.90 cm LV IVS:        0.86 cm LVOT diam:     2.00 cm LV SV:         54.98 ml LV SV Index:   17.15 LVOT Area:     3.14 cm  RIGHT VENTRICLE RV Basal diam:  2.73 cm TAPSE (M-mode): 2.7 cm LEFT ATRIUM             Index       RIGHT ATRIUM           Index LA diam:        3.70 cm 1.89 cm/m  RA Area:     14.60 cm LA Vol (A2C):   61.4 ml 31.31 ml/m RA Volume:   33.90 ml  17.29 ml/m LA Vol (A4C):   46.9 ml 23.92 ml/m LA Biplane Vol: 55.0 ml 28.05 ml/m  AORTIC VALVE AV Area (Vmax):    1.08 cm AV Area (Vmean):   1.08 cm AV Area (VTI):     1.10 cm AV Vmax:           230.00 cm/s AV Vmean:          158.333 cm/s AV VTI:            0.500 m AV Peak Grad:      21.2 mmHg AV Mean Grad:      11.3 mmHg LVOT Vmax:         79.10 cm/s LVOT Vmean:        54.200 cm/s LVOT VTI:          0.175 m LVOT/AV VTI ratio: 0.35 AI PHT:            298 msec  AORTA Ao Root diam: 3.20 cm Ao Asc diam:  2.70 cm MITRAL VALVE                TRICUSPID VALVE MV Area (PHT): 3.72 cm     TR Peak grad:   29.4 mmHg MV Decel Time: 204 msec      TR Vmax:        271.00 cm/s MV E velocity: 115.00 cm/s MV A velocity: 122.00 cm/s  SHUNTS MV E/A ratio:  0.94         Systemic VTI:  0.18 m                             Systemic Diam: 2.00 cm Eleonore Chiquito MD  Electronically signed by Eleonore Chiquito MD Signature Date/Time: 02/21/2019/1:19:24 PM    Final

## 2019-02-23 NOTE — Consult Note (Signed)
Neurology Consultation  Reason for Consult: Stroke Referring Physician: Jonetta Osgood, MD  CC: Right-sided decreased sensation and weakness  History is obtained from: Patient  HPI: Jeanne Sims is a 64 y.o. female with past medical history of diabetes, cervical cancer, pulmonary embolism, COVID-19.  Patient was diagnosed with COVID-19 infection 3 weeks ago.  Due to shortness of breath patient was brought into the hospital found to have bilateral pulmonary embolisms.  While in the hospital patient notes sudden onset of right-sided decreased sensation on the lateral aspect of her right leg.  She is unable get an MRI thus neurology was consulted to further evaluate her.  Patient states that she was in bed yesterday, around 1:00 she noticed that the lateral aspect of her right leg felt as though it was tingling and at this point she has no sensation in that right side of the leg.  She states that she does have sensation on the medial aspect and below the knee of the right leg and no problems with the left leg.  She also notes that she was weaker on the right arm and shoulder when this happened.  The patient's bilateral pulmonary emboli she was placed on Eliquis.  Patient has no other neurological complaints.   LKW: 1300 hrs. on 12/22/2019 tpa given?: no, on Eliquis Premorbid modified Rankin scale (mRS): 0 NIH stroke scale of 5   Past Medical History:  Diagnosis Date  . Acquired deafness   . Cervical cancer ([place]) 1075  . Cervical cancer ([place])   . Diabetes mellitus without complication ([place])   . Dyspareunia   . Heart murmur   . Hodgkin disease ([place]) 1987  . Ovarian cancer [facility])    age 89  . Thyroid disease     Family History  Problem Relation Age of Onset  . Heart failure Mother   . Diabetes Mother   . Diabetes Brother   . Diabetes Sister   . Cervical cancer Sister   . Diabetes Sister   . Cervical cancer Sister   . Diabetes Sister   . Diabetes Brother   . Breast cancer  Paternal Uncle    Social History:   reports that she has never smoked. She has never used smokeless tobacco. She reports that she does not drink alcohol or use drugs.  Medications  Current Facility-Administered Medications:  .  0.9 %  sodium chloride infusion, , Intravenous, PRN, Rise Patience, MD, Last Rate: 10 mL/hr at 02/20/19 1552, New Bag at 02/20/19 1552 .  0.9 %  sodium chloride infusion, , Intravenous, Continuous, Elger[place]y, Silver Huguenin, MD, Last Rate: 50 mL/hr at 02/23/19 1249, 1,000 mL at 02/23/19 1249 .  acetaminophen (TYLENOL) tablet 650 mg, 650 mg, Oral, Q6H PRN, 650 mg at 02/22/19 0159 **OR** acetaminophen (TYLENOL) suppository 650 mg, 650 mg, Rectal, Q6H PRN, Rise Patience, MD .  apixaban (ELIQUIS) tablet 10 mg, 10 mg, Oral, BID, 10 mg at 02/23/19 0942 **FOLLOWED BY** [START ON 03/02/2019] apixaban (ELIQUIS) tablet 5 mg, 5 mg, Oral, BID, Joselyn Glassman A, RPH .  aspirin chewable tablet 81 mg, 81 mg, Oral, Daily, Rise Patience, MD, 81 mg at 02/23/19 0942 .  ferrous sulfate tablet 325 mg, 325 mg, Oral, Q breakfast, Rise Patience, MD, 325 mg at 02/23/19 0809 .  fluticasone (FLONASE) 50 MCG/ACT nasal spray 2 spray, 2 spray, Each Nare, Daily, Elgergawy, Silver Huguenin, MD, 2 spray at 02/23/19 0945 .  folic acid (FOLVITE) tablet 1 mg, 1 mg, Oral,  Daily, Rise Patience, MD, 1 mg at 02/23/19 8921 .  insulin aspart (novoLOG) injection 0-9 Units, 0-9 Units, Subcutaneous, TID WC, Rise Patience, MD, Stopped at 02/23/19 1223 .  levothyroxine (SYNTHROID) tablet 175 mcg, 175 mcg, Oral, QAC breakfast, Rise Patience, MD, 175 mcg at 02/23/19 0809 .  loratadine (CLARITIN) tablet 10 mg, 10 mg, Oral, Daily, Elgergawy, Silver Huguenin, MD, 10 mg at 02/23/19 0942 .  metoprolol succinate (TOPROL-XL) 24 hr tablet 12.5 mg, 12.5 mg, Oral, Daily, Elgergawy, Silver Huguenin, MD, 12.5 mg at 02/23/19 0942 .  morphine 2 MG/ML injection 1 mg, 1 mg, Intravenous, Q4H PRN, Rise Patience, MD, 1 mg at 02/21/19 2020 .  ondansetron (ZOFRAN) injection 4 mg, 4 mg, Intravenous, Once, Rise Patience, MD .  ondansetron [facility]) tablet 4 mg, 4 mg, Oral, Q6H PRN, 4 mg at 02/21/19 2026 **OR** ondansetron (ZOFRAN) injection 4 mg, 4 mg, Intravenous, Q6H PRN, Rise Patience, MD, 4 mg at 02/21/19 1426 .  pantoprazole (PROTONIX) EC tablet 40 mg, 40 mg, Oral, Daily, Rise Patience, MD, 40 mg at 02/23/19 0942 .  rosuvastatin (CRESTOR) tablet 10 mg, 10 mg, Oral, Daily, Rise Patience, MD, 10 mg at 02/23/19 0942  ROS:   General ROS: negative for - chills, fatigue, fever, night sweats, weight gain or weight loss Psychological ROS: negative for - behavioral disorder, hallucinations, memory difficulties, mood swings or suicidal ideation Ophthalmic ROS: negative for - blurry vision, double vision, eye pain or loss of vision ENT ROS: negative for - epistaxis, nasal discharge, oral lesions, sore throat, tinnitus or vertigo Respiratory ROS: negative for - cough, hemoptysis, shortness of breath or wheezing Cardiovascular ROS: negative for - chest pain, dyspnea on exertion, edema or irregular heartbeat Gastrointestinal ROS: negative for - abdominal pain, diarrhea, hematemesis, nausea/vomiting or stool incontinence Genito-Urinary ROS: negative for - dysuria, hematuria, incontinence or urinary frequency/urgency Musculoskeletal ROS: Positive for -  muscular weakness Neurological ROS: as noted in HPI Dermatological ROS: negative for rash and skin lesion changes  Exam: Current vital signs: BP (!) 115/51 (BP Location: Left Arm)   Pulse 81   Temp 98.8 F (37.1 C) (Oral)   Resp (!) 21   Ht 5\' 6"  (1.676 m)   Wt 86.6 kg   SpO2 95%   BMI 30.83 kg/m  Vital signs in last 24 hours: Temp:  [98.5 F (36.9 C)-98.8 F (37.1 C)] 98.8 F (37.1 C) (02/16 2220) Pulse Rate:  [73-81] 81 (02/16 2220) Resp:  [18-21] 21 (02/16 2220) BP: (107-115)/(47-52) 115/51 (02/16 2220) SpO2:  [90 %-97  %] 95 % (02/17 0900)   Constitutional: Appears well-developed and well-nourished.  Psych: Affect appropriate to situation Eyes: No scleral injection HENT: No OP obstrucion Head: Normocephalic.  Cardiovascular: Normal rate and regular rhythm.  Respiratory: Effort normal, non-labored breathing GI: Soft.  No distension. There is no tenderness.  Skin: WDI  Neuro: Mental Status: Patient is awake, alert, oriented to person, place, month, year, and situation. Speech-intact naming, repeating,comprehension Patient is able to give a clear and coherent history. Cranial Nerves: II: Visual Fields are full.  III,IV, VI: EOMI without ptosis or diploplia. Pupils equal, round and reactive to light V: Facial sensation is symmetric to temperature VII: mild Right facial droop.  VIII: hearing is intact to voice X: Palat elevates symmetrically XI: Shoulder shrug is symmetric. XII: tongue is midline without atrophy or fasciculations.  Motor: Tone is normal. Bulk is normal.  Right upper extremity 4/5, right lower extremity 3/5  in the hip flexion, knee extension, knee flexion.  4/5 in dorsiflexion plantarflexion Drift positive on right lower extremity aterixis Sensory: Sensation is symmetric to light touch and temperature in the arms and legs. DSS Deep Tendon Reflexes: 2+ and symmetric in the biceps 0 in the right patella with 2 in the left patella0 in right patella 2 in left patella Plantars: Toes are downgoing bilaterally.  Cerebellar: FNF and HKS are intact with left over right unable to do right over left  Labs I have reviewed labs in epic and the results pertinent to this consultation are:   CBC    Component Value Date/Time   WBC 16.7 (H) 02/23/2019 0506   RBC 3.68 (L) 02/23/2019 0506   HGB 11.4 (L) 02/23/2019 0506   HGB 12.6 11/17/2018 1504   HCT 35.0 (L) 02/23/2019 0506   PLT 448 (H) 02/23/2019 0506   PLT 519 (H) 11/17/2018 1504   MCV 95.1 02/23/2019 0506   MCH 31.0 02/23/2019  0506   MCHC 32.6 02/23/2019 0506   RDW 13.0 02/23/2019 0506   LYMPHSABS 6.0 (H) 02/21/2019 0240   MONOABS 2.6 (H) 02/21/2019 0240   EOSABS 0.4 02/21/2019 0240   BASOSABS 0.2 (H) 02/21/2019 0240    CMP     Component Value Date/Time   NA 140 02/23/2019 0506   K 4.0 02/23/2019 0506   CL 109 02/23/2019 0506   CO2 24 02/23/2019 0506   GLUCOSE 137 (H) 02/23/2019 0506   BUN 5 (L) 02/23/2019 0506   CREATININE 0.62 02/23/2019 0506   CREATININE 0.70 10/27/2018 1401   CALCIUM 8.2 (L) 02/23/2019 0506   PROT 5.7 (L) 02/21/2019 0240   ALBUMIN 3.1 (L) 02/21/2019 0240   AST 15 02/21/2019 0240   AST 29 10/27/2018 1401   ALT 17 02/21/2019 0240   ALT 28 10/27/2018 1401   ALKPHOS 61 02/21/2019 0240   BILITOT 0.6 02/21/2019 0240   BILITOT 0.2 (L) 10/27/2018 1401   GFRNONAA >60 02/23/2019 0506   GFRNONAA >60 10/27/2018 1401   GFRAA >60 02/23/2019 0506   GFRAA >60 10/27/2018 1401    Lipid Panel  No results found for: CHOL, TRIG, HDL, CHOLHDL, VLDL, LDLCALC, LDLDIRECT   Imaging I have reviewed the images obtained:  CT-scan of the brain-This was slightly obscured secondary to cochlear implants however there was no evidence of acute intracranial abnormality.    CTA head/neck-no large vessel occlusions noted intracranially and/or in bilateral ICAs  Echo-left ventricular fraction estimated to be 60 to 65%.  No atrial shunt  MRI examination of the brain-unable to obtain secondary to cochlear implant  Etta Quill PA-C [facility] [phone]  M-F  (9:00 am- 5:00 PM)  02/23/2019, 12:50 PM    Assessment: This is a 64 year old female presenting to the hospital secondary to shortness of breath found to be bilateral pulmonary emboli.  While in hospital noted sudden onset of right lateral thigh paresthesia which turned into no sensation.  In addition right arm weakness.  Unfortunately patient is unable to obtain MRI due to cochlear implant.  Given sudden onset and exam patient  likely has suffered a stroke.  I suspect that this is small, but would repeat CT tomorrow morning.   Impression: -Left brain stroke  Recommend - repeat CT in the AM.  -Continue anticoagulation -Start or continue Atorvastatin 80 mg/other high intensity statin -BP goal: permissive HTN upto 220/120 mmHg -HBAIC and Lipid profile -Telemetry monitoring -Frequent neuro checks -NPO until passes stroke swallow screen -PT/OT # please page  stroke NP  Or  PA  Or MD from 8am -4 pm  as this patient from this time will be  followed by the stroke.   You can look them up on [email or web address]  Password [email or web address]  [name] Rack, MD [facility] [phone]  If 7pm- 7am, please page neurology on call as listed in [place].

## 2019-02-24 ENCOUNTER — Inpatient Hospital Stay (HOSPITAL_COMMUNITY): Payer: Commercial Managed Care - PPO

## 2019-02-24 DIAGNOSIS — I639 Cerebral infarction, unspecified: Secondary | ICD-10-CM

## 2019-02-24 LAB — CBC
HCT: 36.3 % (ref 36.0–46.0)
Hemoglobin: 11.9 g/dL — ABNORMAL LOW (ref 12.0–15.0)
MCH: 31.2 pg (ref 26.0–34.0)
MCHC: 32.8 g/dL (ref 30.0–36.0)
MCV: 95.3 fL (ref 80.0–100.0)
Platelets: 440 10*3/uL — ABNORMAL HIGH (ref 150–400)
RBC: 3.81 MIL/uL — ABNORMAL LOW (ref 3.87–5.11)
RDW: 13.2 % (ref 11.5–15.5)
WBC: 15.5 10*3/uL — ABNORMAL HIGH (ref 4.0–10.5)
nRBC: 0 % (ref 0.0–0.2)

## 2019-02-24 LAB — HEMOGLOBIN A1C
Hgb A1c MFr Bld: 7.2 % — ABNORMAL HIGH (ref 4.8–5.6)
Mean Plasma Glucose: 159.94 mg/dL

## 2019-02-24 LAB — LIPID PANEL
Cholesterol: 134 mg/dL (ref 0–200)
HDL: 35 mg/dL — ABNORMAL LOW (ref >40)
LDL Cholesterol: 72 mg/dL (ref 0–99)
Total CHOL/HDL Ratio: 3.8 RATIO
Triglycerides: 135 mg/dL (ref <150)
VLDL: 27 mg/dL (ref 0–40)

## 2019-02-24 LAB — GLUCOSE, CAPILLARY: Glucose-Capillary: 127 mg/dL — ABNORMAL HIGH (ref 70–99)

## 2019-02-24 LAB — HEPARIN LEVEL (UNFRACTIONATED): Heparin Unfractionated: >2.2 IU/mL — ABNORMAL HIGH (ref 0.30–0.70)

## 2019-02-24 MED ORDER — APIXABAN 5 MG PO TABS: 5.0000 mg | ORAL_TABLET | Freq: Two times a day (BID) | ORAL | 1 refills | Status: DC

## 2019-02-24 MED ORDER — ROSUVASTATIN CALCIUM 20 MG PO TABS: 20.0000 mg | ORAL_TABLET | Freq: Every day | ORAL | 0 refills | Status: AC

## 2019-02-24 MED ORDER — STROKE: EARLY STAGES OF RECOVERY BOOK
Freq: Once | Status: AC
  Administered 2019-02-24: 1
  Filled 2019-02-24: qty 1

## 2019-02-24 MED ORDER — APIXABAN 5 MG PO TABS: 10.0000 mg | ORAL_TABLET | Freq: Two times a day (BID) | ORAL | 0 refills | Status: DC

## 2019-02-24 MED ORDER — PNEUMOCOCCAL VAC POLYVALENT 25 MCG/0.5ML IJ INJ
0.5000 mL | INJECTION | INTRAMUSCULAR | Status: AC
  Administered 2019-02-24: 09:00:00 0.5 mL via INTRAMUSCULAR
  Filled 2019-02-24: qty 0.5

## 2019-02-24 MED FILL — ELIQUIS 5 MG TABLET: 5 | 30 days supply | Qty: 60 | Fill #0

## 2019-02-24 MED FILL — ROSUVASTATIN CALCIUM 20 MG: 20 | 30 days supply | Qty: 30 | Fill #0

## 2019-02-24 NOTE — Discharge Summary (Signed)
PATIENT DETAILS Name: Jeanne Sims Age: 64 y.o. Sex: female Date of Birth: 1955/08/16 MRN: [id number]. Admitting Physician: Rise Patience, MD [id number], Rosalyn Charters, MD  Admit Date: 02/20/2019 Discharge date: 02/24/2019  Recommendations for Outpatient Follow-up:  1. Follow up with PCP in 1-2 weeks 2. Please obtain CMP/CBC in one week 3. Repeat Chest Xray in 4-6 week 4. Please follow up with the stroke clinic/neurology 5. Consider follow-up with hematology before discontinuing anticoagulation.  Admitted From:  Home  Disposition: Home with home health services    [place]: Yes  Equipment/Devices: None  Discharge Condition: Stable  CODE STATUS: FULL CODE  Diet recommendation:  Diet Order            Diet - low sodium heart healthy        Diet Carb Modified        Diet heart healthy/carb modified Room service appropriate? Yes; Fluid consistency: Thin  Diet effective now               Brief Summary: See H&P, Labs, Consult and Test reports for all details in brief, Patient is a 64 y.o. female with PMHx of ovarian/cervical cancer-lymphoma in remission-chronic leukocytosis, thrombocytosis, hypothyroidism-DM-2-known covid + since late January- presented with shortness of breath-found to have  pulmonary embolism.  Hospital course complicated by development of mild right-sided weakness-likely secondary to a small CVA not seen on CT scanning.  See below for further details.  Brief Hospital Course: Pulmonary embolism: Improved-no clinical features of RV strain-echo without RV strain.  Initially on IV heparin-has been transitioned to Eliquis.  Lower extremity Dopplers negative for DVT.  Please consider outpatient referral to hematology before anticoagulation is discontinued.  PE likely provoked by COVID-19 and estradiol use.  Covid 19 infection: Not hypoxic-appears stable-suspect shortness of breath was from pulmonary embolism.  Not on steroids/remdesivir   Right  lower extremity tingling and numbness likely secondary to CVA: Occurred suddenly on 2/16-CTA head/neck without any acute abnormalities.  Unable to perform MRI brain as patient has a cochlear implant.    Does have some very mild right lower extremity weakness-and continues to have some tingling and numbness in the right thigh.  Repeat CT head on 2/18 without CVA.  Spoke with Dr. Carilyn Goodpasture MD-no further work-up needed-already on anticoagulation-hence no need for any antiplatelet agents.  We will continue with statin on discharge.  Patient will need outpatient follow-up with neurology/stroke clinic.   Chronic leukocytosis/thrombocytosis: Stable-follow-up with outpatient oncologist  History of MVP/palpitations: On metoprolol  DM-2: CBG JOINOM-V6H 2.0-NOBSJG Trulicity and Metformin on discharge.  HLD: Continue statin  Hypothyroidism: Continue Synthroid  History of Hodgkin's lymphoma s/p radiation, history of cervical/ovarian cancer:  in remission-stable for follow-up with oncology as an outpatient  Hearing loss-s/p cochlear implant  Obesity: Estimated body mass index is 30.83 kg/m as calculated from the following:   Height as of this encounter: 5' 6"  (1.676 m).   Weight as of this encounter: 86.6 kg.    Procedures/Studies: None  Discharge Diagnoses:  Principal Problem:   Acute pulmonary embolism without acute cor pulmonale (HCC) Active Problems:   HLD (hyperlipidemia)   Controlled type 2 diabetes mellitus with hyperglycemia (HCC)   Hypothyroidism   Acute pulmonary embolism ([place])   COVID-19 virus infection   Discharge Instructions:    Person Under Monitoring Name: Jeanne Sims  Location: [address] [place] [address]   Infection Prevention Recommendations for Individuals Confirmed to have, or Being Evaluated for, 2019 Novel Coronavirus (COVID-19)  Infection Who Receive Care at Home  Individuals who are confirmed to have, or are being evaluated for, COVID-19  should follow the prevention steps below until a healthcare provider or local or state health department says they can return to normal activities.  Stay home except to get medical care You should restrict activities outside your home, except for getting medical care. Do not go to work, school, or public areas, and do not use public transportation or taxis.  Call ahead before visiting your doctor Before your medical appointment, call the healthcare provider and tell them that you have, or are being evaluated for, COVID-19 infection. This will help the healthcare provider's office take steps to keep other people from getting infected. Ask your healthcare provider to call the local or state health department.  Monitor your symptoms Seek prompt medical attention if your illness is worsening (e.g., difficulty breathing). Before going to your medical appointment, call the healthcare provider and tell them that you have, or are being evaluated for, COVID-19 infection. Ask your healthcare provider to call the local or state health department.  Wear a facemask You should wear a facemask that covers your nose and mouth when you are in the same room with other people and when you visit a healthcare provider. People who live with or visit you should also wear a facemask while they are in the same room with you.  Separate yourself from other people in your home As much as possible, you should stay in a different room from other people in your home. Also, you should use a separate bathroom, if available.  Avoid sharing household items You should not share dishes, drinking glasses, cups, eating utensils, towels, bedding, or other items with other people in your home. After using these items, you should wash them thoroughly with soap and water.  Cover your coughs and sneezes Cover your mouth and nose with a tissue when you cough or sneeze, or you can cough or sneeze into your sleeve. Throw used  tissues in a lined trash can, and immediately wash your hands with soap and water for at least 20 seconds or use an alcohol-based hand rub.  Wash your [facility] your hands often and thoroughly with soap and water for at least 20 seconds. You can use an alcohol-based hand sanitizer if soap and water are not available and if your hands are not visibly dirty. Avoid touching your eyes, nose, and mouth with unwashed hands.   Prevention Steps for Caregivers and Household Members of Individuals Confirmed to have, or Being Evaluated for, COVID-19 Infection Being Cared for in the Home  If you live with, or provide care at home for, a person confirmed to have, or being evaluated for, COVID-19 infection please follow these guidelines to prevent infection:  Follow healthcare provider's instructions Make sure that you understand and can help the patient follow any healthcare provider instructions for all care.  Provide for the patient's basic needs You should help the patient with basic needs in the home and provide support for getting groceries, prescriptions, and other personal needs.  Monitor the patient's symptoms If they are getting sicker, call his or her medical provider and tell them that the patient has, or is being evaluated for, COVID-19 infection. This will help the healthcare provider's office take steps to keep other people from getting infected. Ask the healthcare provider to call the local or state health department.  Limit the number of people who have contact with the patient  If possible, have only one caregiver for the patient.  Other household members should stay in another home or place of residence. If this is not possible, they should stay  in another room, or be separated from the patient as much as possible. Use a separate bathroom, if available.  Restrict visitors who do not have an essential need to be in the home.  Keep older adults, very young children, and  other sick people away from the patient Keep older adults, very young children, and those who have compromised immune systems or chronic health conditions away from the patient. This includes people with chronic heart, lung, or kidney conditions, diabetes, and cancer.  Ensure good ventilation Make sure that shared spaces in the home have good air flow, such as from an air conditioner or an opened window, weather permitting.  Wash your hands often  Wash your hands often and thoroughly with soap and water for at least 20 seconds. You can use an alcohol based hand sanitizer if soap and water are not available and if your hands are not visibly dirty.  Avoid touching your eyes, nose, and mouth with unwashed hands.  Use disposable paper towels to dry your hands. If not available, use dedicated cloth towels and replace them when they become wet.  Wear a facemask and gloves  Wear a disposable facemask at all times in the room and gloves when you touch or have contact with the patient's blood, body fluids, and/or secretions or excretions, such as sweat, saliva, sputum, nasal mucus, vomit, urine, or feces.  Ensure the mask fits over your nose and mouth tightly, and do not touch it during use.  Throw out disposable facemasks and gloves after using them. Do not reuse.  Wash your hands immediately after removing your facemask and gloves.  If your personal clothing becomes contaminated, carefully remove clothing and launder. Wash your hands after handling contaminated clothing.  Place all used disposable facemasks, gloves, and other waste in a lined container before disposing them with other household waste.  Remove gloves and wash your hands immediately after handling these items.  Do not share dishes, glasses, or other household items with the patient  Avoid sharing household items. You should not share dishes, drinking glasses, cups, eating utensils, towels, bedding, or other items with a patient  who is confirmed to have, or being evaluated for, COVID-19 infection.  After the person uses these items, you should wash them thoroughly with soap and water.  Wash laundry thoroughly  Immediately remove and wash clothes or bedding that have blood, body fluids, and/or secretions or excretions, such as sweat, saliva, sputum, nasal mucus, vomit, urine, or feces, on them.  Wear gloves when handling laundry from the patient.  Read and follow directions on labels of laundry or clothing items and detergent. In general, wash and dry with the warmest temperatures recommended on the label.  Clean all areas the individual has used often  Clean all touchable surfaces, such as counters, tabletops, doorknobs, bathroom fixtures, toilets, phones, keyboards, tablets, and bedside tables, every day. Also, clean any surfaces that may have blood, body fluids, and/or secretions or excretions on them.  Wear gloves when cleaning surfaces the patient has come in contact with.  Use a diluted bleach solution (e.g., dilute bleach with 1 part bleach and 10 parts water) or a household disinfectant with a label that says EPA-registered for coronaviruses. To make a bleach solution at home, add 1 tablespoon of bleach to 1 quart (4 cups)  of water. For a larger supply, add  cup of bleach to 1 gallon (16 cups) of water.  Read labels of cleaning products and follow recommendations provided on product labels. Labels contain instructions for safe and effective use of the cleaning product including precautions you should take when applying the product, such as wearing gloves or eye protection and making sure you have good ventilation during use of the product.  Remove gloves and wash hands immediately after cleaning.  Monitor yourself for signs and symptoms of illness Caregivers and household members are considered close contacts, should monitor their health, and will be asked to limit movement outside of the home to the extent  possible. Follow the monitoring steps for close contacts listed on the symptom monitoring form.   ? If you have additional questions, contact your local health department or call the epidemiologist on call at [phone] (available 24/7). ? This guidance is subject to change. For the most up-to-date guidance from CDC, please refer to their website: YouBlogs.pl    Activity:  As tolerated with Full fall precautions use walker/cane & assistance as needed   Discharge Instructions    Ambulatory referral to Neurology   Complete by: As directed    An appointment is requested in approximately: 4 weeks   Call MD for:  difficulty breathing, headache or visual disturbances   Complete by: As directed    Call MD for:  extreme fatigue   Complete by: As directed    Call MD for:  persistant dizziness or light-headedness   Complete by: As directed    Call MD for:  persistant nausea and vomiting   Complete by: As directed    Diet - low sodium heart healthy   Complete by: As directed    Diet Carb Modified   Complete by: As directed    Discharge instructions   Complete by: As directed    1.)  3 weeks of isolation from your first positive COVID-19 test   Follow with Primary MD  Bonnita Nasuti, MD in 1-2 weeks  Please get a complete blood count and chemistry panel checked by your Primary MD at your next visit, and again as instructed by your Primary MD.  Get Medicines reviewed and adjusted: Please take all your medications with you for your next visit with your Primary MD  Laboratory/radiological data: Please request your Primary MD to go over all hospital tests and procedure/radiological results at the follow up, please ask your Primary MD to get all Hospital records sent to his/her office.  In some cases, they will be blood work, cultures and biopsy results pending at the time of your discharge. Please request that your primary  care M.D. follows up on these results.  Also Note the following: If you experience worsening of your admission symptoms, develop shortness of breath, life threatening emergency, suicidal or homicidal thoughts you must seek medical attention immediately by calling 911 or calling your MD immediately  if symptoms less severe.  You must read complete instructions/literature along with all the possible adverse reactions/side effects for all the Medicines you take and that have been prescribed to you. Take any new Medicines after you have completely understood and accpet all the possible adverse reactions/side effects.   Do not drive when taking Pain medications or sleeping medications (Benzodaizepines)  Do not take more than prescribed Pain, Sleep and Anxiety Medications. It is not advisable to combine anxiety,sleep and pain medications without talking with your primary care practitioner  Special Instructions:  If you have smoked or chewed Tobacco  in the last 2 yrs please stop smoking, stop any regular Alcohol  and or any Recreational drug use.  Wear Seat belts while driving.  Please note: You were cared for by a hospitalist during your hospital stay. Once you are discharged, your primary care physician will handle any further medical issues. Please note that NO REFILLS for any discharge medications will be authorized once you are discharged, as it is imperative that you return to your primary care physician (or establish a relationship with a primary care physician if you do not have one) for your post hospital discharge needs so that they can reassess your need for medications and monitor your lab values.   Increase activity slowly   Complete by: As directed      Allergies as of 02/24/2019   No Known Allergies     Medication List    STOP taking these medications   aspirin 81 MG chewable tablet   estradiol 0.025 mg/24hr patch Commonly known as: CLIMARA - Dosed in mg/24 hr     TAKE these  medications   apixaban 5 MG Tabs tablet Commonly known as: ELIQUIS Take 2 tablets (10 mg total) by mouth 2 (two) times daily for 5 days. LAST DOSE ON 03/01/19-BEFORE SWITCHING TO 5 MG TWICE DAILY ON 2/24   apixaban 5 MG Tabs tablet Commonly known as: ELIQUIS Take 1 tablet (5 mg total) by mouth 2 (two) times daily. START THIS DOSE ON 2/24 Start taking on: March 02, 2019   Cinnamon 500 MG capsule Take 2,000 mg by mouth 2 (two) times daily.   Cyanocobalamin 1000 MCG/ML Kit Inject as directed every 30 (thirty) days.   Dexilant 60 MG capsule Generic drug: dexlansoprazole Take 60 mg by mouth daily.   Euthyrox 175 MCG tablet Generic drug: levothyroxine Take 175 mcg by mouth daily before breakfast.   ferrous sulfate 325 (65 FE) MG tablet Take 1 tablet (325 mg total) by mouth daily with breakfast. Please take with a source of vitamin C (orange juice or tablet with 500 units of Vitamin C).   folic acid 941 MCG tablet Commonly known as: FOLVITE Take 800 mcg by mouth daily.   Linzess 290 MCG Caps capsule Generic drug: linaclotide Take 290 mcg by mouth daily before breakfast.   metFORMIN 500 MG tablet Commonly known as: GLUCOPHAGE Take 500 mg by mouth 2 (two) times daily with a meal.   metoprolol succinate 25 MG 24 hr tablet Commonly known as: TOPROL-XL Take 12.5 mg by mouth daily.   polyethylene glycol 17 g packet Commonly known as: MIRALAX / GLYCOLAX Take 17 g by mouth daily.   rosuvastatin 20 MG tablet Commonly known as: CRESTOR Take 1 tablet (20 mg total) by mouth daily. What changed:   medication strength  how much to take   Trulicity 1.5 DE/0.8XK Sopn Generic drug: Dulaglutide Inject 1.5 mg into the skin once a week.   Vitamin D (Ergocalciferol) 1.25 MG (50000 UNIT) Caps capsule Commonly known as: DRISDOL Take 50,000 Units by mouth every 7 (seven) days.            Durable Medical Equipment  (From admission, onward)         Start     Ordered    02/24/19 0746  For home use only DME Walker rolling  Once    Question Answer Comment  Walker: With 5 Inch Wheels   Patient needs a walker to treat with the following condition  CVA (cerebral vascular accident) ([facility])      02/24/19 0746          No Known Allergies  Consultations:   neurology  Other Procedures/Studies: CT ANGIO HEAD W OR WO CONTRAST  Result Date: 02/22/2019 CLINICAL DATA:  Right lower extremity sudden weakness, on heparin drip; stroke/TIA, assess intracranial arteries. EXAM: CT ANGIOGRAPHY HEAD AND NECK TECHNIQUE: Multidetector CT imaging of the head and neck was performed using the standard protocol during bolus administration of intravenous contrast. Multiplanar CT image reconstructions and MIPs were obtained to evaluate the vascular anatomy. Carotid stenosis measurements (when applicable) are obtained utilizing NASCET criteria, using the distal internal carotid diameter as the denominator. CONTRAST:  89m OMNIPAQUE IOHEXOL 350 MG/ML SOLN COMPARISON:  Report from head CT 12/07/2013 (images unavailable) FINDINGS: CT HEAD FINDINGS Brain: Streak artifact from a left-sided cochlear implant obscures portions of the left cerebral hemisphere, limiting evaluation. There is no evidence of acute intracranial hemorrhage or demarcated cortical infarction. No evidence of intracranial mass. No midline shift or extra-axial fluid collection. Mild generalized parenchymal atrophy Vascular: Reported separately. Skull: Normal. Negative for fracture or focal lesion. Sinuses: Minimal mucosal thickening within the left sphenoid sinus. Orbits: Visualized orbits demonstrate no acute abnormality. Other: Sequela of previous left mastoidectomy. No significant mastoid effusion. Review of the MIP images confirms the above findings CTA NECK FINDINGS Aortic arch: Standard branching. Scattered calcified plaque within the visualized aortic arch. No significant innominate or proximal subclavian stenosis. Right  carotid system: CCA and ICA patent within the neck without measurable stenosis. Mild mixed plaque within the proximal ICA. Left carotid system: CCA and ICA patent within the neck without measurable stenosis. Mild mixed plaque within the proximal ICA. Vertebral arteries: Left vertebral artery dominant. The vertebral arteries are patent within the neck without stenosis. Skeleton: No acute bony abnormality. Other neck: No neck mass or cervical lymphadenopathy. The thyroid gland is atrophic or surgically absent. Upper chest: Linear atelectasis and/or scarring within the left lung apex. Partially visualized small left pleural effusion. Review of the MIP images confirms the above findings CTA HEAD FINDINGS Anterior circulation: The intracranial internal carotid arteries are patent with scattered calcified plaque but no significant stenosis. The M1 middle cerebral arteries are patent bilaterally without significant stenosis. No M2 proximal branch occlusion or high-grade proximal stenosis is identified. The anterior cerebral arteries are patent bilaterally without high-grade proximal stenosis. The A1 left ACA is slightly hypoplastic. No intracranial aneurysm is identified. Posterior circulation: The non dominant intracranial right vertebral artery is developmentally diminutive beyond the origin of the right PICA, although patent. The dominant intracranial left vertebral artery is patent without significant stenosis, as is the basilar artery. The posterior cerebral arteries are patent bilaterally without significant proximal stenosis. Posterior communicating arteries are poorly delineated and may be hypoplastic or absent bilaterally. Venous sinuses: Within limitations of contrast timing, no convincing thrombus. Anatomic variants: As described Review of the MIP images confirms the above findings IMPRESSION: CT head: 1. Streak artifact from a left-sided cochlear implant obscures portions of the left cerebral hemisphere,  limiting evaluation. 2. Within this limitation, no evidence of acute intracranial abnormality. 3. Mild generalized parenchymal atrophy. CTA neck: The bilateral common carotid, internal carotid and vertebral arteries are patent within the neck without measurable stenosis. Mild atherosclerotic plaque within the visualized aortic arch and carotid systems. CTA head: 1. No intracranial large vessel occlusion or proximal high-grade arterial stenosis. 2. Mild calcified plaque within the intracranial internal carotid arteries without stenosis. Electronically Signed   By:  Kellie Simmering DO   On: 02/22/2019 17:59   CT HEAD WO CONTRAST  Result Date: 02/24/2019 CLINICAL DATA:  Stroke follow-up. Right-sided weakness and decreased sensation. EXAM: CT HEAD WITHOUT CONTRAST TECHNIQUE: Contiguous axial images were obtained from the base of the skull through the vertex without intravenous contrast. COMPARISON:  Two days ago FINDINGS: Brain: There is indistinct anterior right insula, likely artifact from the patient's cochlear implant as this does not correlate with the reported symptoms. No identified infarct, hemorrhage, hydrocephalus, or masslike finding. Vascular: No hyperdense vessel. Skull: Negative Sinuses/Orbits: Unremarkable left cochlear implant. IMPRESSION: Stable head CT.  No visible infarct. Electronically Signed   By: Monte Fantasia M.D.   On: 02/24/2019 09:42   CT ANGIO NECK W OR WO CONTRAST  Result Date: 02/22/2019 CLINICAL DATA:  Right lower extremity sudden weakness, on heparin drip; stroke/TIA, assess intracranial arteries. EXAM: CT ANGIOGRAPHY HEAD AND NECK TECHNIQUE: Multidetector CT imaging of the head and neck was performed using the standard protocol during bolus administration of intravenous contrast. Multiplanar CT image reconstructions and MIPs were obtained to evaluate the vascular anatomy. Carotid stenosis measurements (when applicable) are obtained utilizing NASCET criteria, using the distal  internal carotid diameter as the denominator. CONTRAST:  57m OMNIPAQUE IOHEXOL 350 MG/ML SOLN COMPARISON:  Report from head CT 12/07/2013 (images unavailable) FINDINGS: CT HEAD FINDINGS Brain: Streak artifact from a left-sided cochlear implant obscures portions of the left cerebral hemisphere, limiting evaluation. There is no evidence of acute intracranial hemorrhage or demarcated cortical infarction. No evidence of intracranial mass. No midline shift or extra-axial fluid collection. Mild generalized parenchymal atrophy Vascular: Reported separately. Skull: Normal. Negative for fracture or focal lesion. Sinuses: Minimal mucosal thickening within the left sphenoid sinus. Orbits: Visualized orbits demonstrate no acute abnormality. Other: Sequela of previous left mastoidectomy. No significant mastoid effusion. Review of the MIP images confirms the above findings CTA NECK FINDINGS Aortic arch: Standard branching. Scattered calcified plaque within the visualized aortic arch. No significant innominate or proximal subclavian stenosis. Right carotid system: CCA and ICA patent within the neck without measurable stenosis. Mild mixed plaque within the proximal ICA. Left carotid system: CCA and ICA patent within the neck without measurable stenosis. Mild mixed plaque within the proximal ICA. Vertebral arteries: Left vertebral artery dominant. The vertebral arteries are patent within the neck without stenosis. Skeleton: No acute bony abnormality. Other neck: No neck mass or cervical lymphadenopathy. The thyroid gland is atrophic or surgically absent. Upper chest: Linear atelectasis and/or scarring within the left lung apex. Partially visualized small left pleural effusion. Review of the MIP images confirms the above findings CTA HEAD FINDINGS Anterior circulation: The intracranial internal carotid arteries are patent with scattered calcified plaque but no significant stenosis. The M1 middle cerebral arteries are patent  bilaterally without significant stenosis. No M2 proximal branch occlusion or high-grade proximal stenosis is identified. The anterior cerebral arteries are patent bilaterally without high-grade proximal stenosis. The A1 left ACA is slightly hypoplastic. No intracranial aneurysm is identified. Posterior circulation: The non dominant intracranial right vertebral artery is developmentally diminutive beyond the origin of the right PICA, although patent. The dominant intracranial left vertebral artery is patent without significant stenosis, as is the basilar artery. The posterior cerebral arteries are patent bilaterally without significant proximal stenosis. Posterior communicating arteries are poorly delineated and may be hypoplastic or absent bilaterally. Venous sinuses: Within limitations of contrast timing, no convincing thrombus. Anatomic variants: As described Review of the MIP images confirms the above findings IMPRESSION: CT head:  1. 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 2. Within this limitation, no evidence of acute intracranial abnormality. 3. Mild generalized parenchymal atrophy. CTA neck: The bilateral common carotid, internal carotid and vertebral arteries are patent within the neck without measurable stenosis. Mild atherosclerotic plaque within the visualized aortic arch and carotid systems. CTA head: 1. No intracranial large vessel occlusion or proximal high-grade arterial stenosis. 2. Mild calcified plaque within the intracranial internal carotid arteries without stenosis. Electronically Signed   By: Kellie Simmering DO   On: 02/22/2019 17:59   CT Angio Chest PE W and/or Wo Contrast  Result Date: 02/20/2019 CLINICAL DATA:  Pleuritic chest pain with shortness of breath and tachypnea. Current COVID infection. New right leg pain. History of non-Hodgkin's lymphoma, cervical cancer, ovarian cancer. EXAM: CT ANGIOGRAPHY CHEST  WITH CONTRAST TECHNIQUE: Multidetector CT imaging of the chest was performed using the standard protocol during bolus administration of intravenous contrast. Multiplanar CT image reconstructions and MIPs were obtained to evaluate the vascular anatomy. CONTRAST:  72m OMNIPAQUE IOHEXOL 350 MG/ML SOLN COMPARISON:  CT chest 11/10/2018. FINDINGS: Cardiovascular: Filling defect is identified in the right middle lobe medial and lateral segmental pulmonary arteries, in the anterior basal and medial basal right lower lobe segmental pulmonary arteries, and in a subsegmental pulmonary artery in the left lower lobe (image 207/6). No lobar are larger thrombus is identified, and as result, this case does not qualify for submassive pulmonary embolus, which necessitates lobar or larger thrombus based on our current protocols. Coronary, aortic arch, and branch vessel atherosclerotic vascular disease. Overall heart size within normal limits. 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 Although the patient has a current SARS-CoV-2 infection, we do not demonstrate the typical patchy ground-glass opacities of active COVID pneumonia. Trace left pleural effusion. Upper Abdomen: Stable 0.9 cm hypodense lesion in segment 4 of the liver on image 91/4, previously characterized as a cyst. 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 Review of the MIP images confirms the above findings. IMPRESSION: 1. Acute segmental pulmonary emboli in the right middle lobe and right lower. Acute subsegmental embolus in the left lower lobe. Lobe overall clot burden is small. No lobar or larger thrombus is identified, accordingly this does not qualify as submassive pulmonary  embolus based on current protocols. 2. Trace left pleural effusion. 3. Coronary, aortic arch, and branch vessel atherosclerotic vascular disease. Aortic Atherosclerosis (ICD10-I70.0). 4. 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 No lung opacities to suggest active pneumonia related to the patient's current SARS-CoV-2 infection. 5. 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 6. Stable hypodense lesion in segment 4 of the liver, previously characterized as a cyst. Critical Value/emergent results were called by telephone at the time of interpretation on 02/20/2019 at 2:05 pm to provider Dr. CMarda Stalker, who verbally acknowledged these results. Electronically Signed   By: WVan ClinesM.D.   On: 02/20/2019 14:28   [place] Img Lower Unilateral Right  Result Date: 02/20/2019 CLINICAL DATA:  Right calf cramping. EXAM: RIGHT LOWER EXTREMITY VENOUS DOPPLER ULTRASOUND TECHNIQUE: Gray-scale sonography with graded compression, as well as color Doppler  and duplex ultrasound were performed to evaluate the lower extremity deep venous systems from the level of the common femoral vein and including the common femoral, femoral, profunda femoral, popliteal and calf veins including the posterior tibial, peroneal and gastrocnemius veins when visible. The superficial great saphenous vein was also interrogated. Spectral Doppler was utilized to evaluate flow at rest and with distal augmentation maneuvers in the common femoral, femoral and popliteal veins. COMPARISON:  None. FINDINGS: Contralateral Common Femoral Vein: Respiratory phasicity is normal and symmetric with the symptomatic side. No evidence of thrombus. Normal compressibility. Common Femoral Vein: No evidence of thrombus. Normal compressibility, respiratory phasicity and response to augmentation.  Saphenofemoral Junction: No evidence of thrombus. Normal compressibility and flow on color Doppler imaging. Profunda Femoral Vein: No evidence of thrombus. Normal compressibility and flow on color Doppler imaging. Femoral Vein: No evidence of thrombus. Normal compressibility, respiratory phasicity and response to augmentation. Popliteal Vein: No evidence of thrombus. Normal compressibility, respiratory phasicity and response to augmentation. Calf Veins: No evidence of thrombus. Normal compressibility and flow on color Doppler imaging. Superficial Great Saphenous Vein: No evidence of thrombus. Normal compressibility. Venous Reflux:  None. Other Findings: No evidence of superficial thrombophlebitis or abnormal fluid collection. IMPRESSION: No evidence of right lower extremity deep venous thrombosis. Electronically Signed   By: Aletta Edouard M.D.   On: 02/20/2019 13:21   ECHOCARDIOGRAM LIMITED  Result Date: 02/21/2019    ECHOCARDIOGRAM LIMITED REPORT   Patient Name:   Jeanne Sims [facility] Date of Exam: 02/21/2019 Medical Rec #:  [id number]       Height:       66.0 in Accession #:    [id number]      Weight:       191.0 lb Date of Birth:  1955/12/10        BSA:          1.96 m Patient Age:    24 years        BP:           114/49 mmHg Patient Gender: F               HR:           77 bpm. Exam Location:  Inpatient Procedure: Limited Echo, Cardiac Doppler and Limited Color Doppler Indications:    I26.02 Pulmonary embolus  History:        Patient has no prior history of Echocardiogram examinations.                 Signs/Symptoms:Dyspnea and Murmur; Risk Factors:Diabetes. COVID                 Positive. Cancer. Thyroid Disease.  Sonographer:    Tiffany Dance Referring Phys: [place]  Sonographer Comments: No subcostal window. IMPRESSIONS  1. Left ventricular ejection fraction, by estimation, is 60 to 65%. The left ventricle has normal function. The left ventricle has no regional wall motion abnormalities. Left  ventricular diastolic function could not be evaluated.  2. Right ventricular systolic function is normal. The right ventricular size is normal.  3. The mitral valve is degenerative. Trivial mitral valve regurgitation.  4. The aortic valve is tricuspid. Aortic valve regurgitation is mild. Mild aortic valve stenosis. Comparison(s): No prior Echocardiogram. FINDINGS  Left Ventricle: Left ventricular ejection fraction, by estimation, is 60 to 65%. The left ventricle has normal function. The left ventricle has no regional wall motion abnormalities. The left ventricular internal cavity size was normal in size.  There is  no left ventricular hypertrophy. The left ventricular diastology could not be evaluated due to mitral annular calcification (moderate or greater). Right Ventricle: The right ventricular size is normal. No increase in right ventricular wall thickness. Right ventricular systolic function is normal. Left Atrium: Left atrial size was normal in size. Right Atrium: Right atrial size was normal in size. Pericardium: There is no evidence of pericardial effusion. Mitral Valve: The mitral valve is degenerative in appearance. Moderate mitral annular calcification. Trivial mitral valve regurgitation. Tricuspid Valve: The tricuspid valve is grossly normal. Tricuspid valve regurgitation is mild. Aortic Valve: The aortic valve is tricuspid. . There is moderate thickening and moderate calcification of the aortic valve. Aortic valve regurgitation is mild. Aortic regurgitation PHT measures 298 msec. Mild aortic stenosis is present. There is moderate  thickening of the aortic valve. There is moderate calcification of the aortic valve. Aortic valve mean gradient measures 11.3 mmHg. Aortic valve peak gradient measures 21.2 mmHg. Aortic valve area, by VTI measures 1.10 cm. Pulmonic Valve: The pulmonic valve was grossly normal. Pulmonic valve regurgitation is mild. Aorta: The aortic root is normal in size and structure. Venous:  The inferior vena cava was not well visualized. IAS/Shunts: No atrial level shunt detected by color flow Doppler.  LEFT VENTRICLE PLAX 2D LVIDd:         3.93 cm LVIDs:         2.90 cm LV PW:         0.90 cm LV IVS:        0.86 cm LVOT diam:     2.00 cm LV SV:         54.98 ml LV SV Index:   17.15 LVOT Area:     3.14 cm  RIGHT VENTRICLE RV Basal diam:  2.73 cm TAPSE (M-mode): 2.7 cm LEFT ATRIUM             Index       RIGHT ATRIUM           Index LA diam:        3.70 cm 1.89 cm/m  RA Area:     14.60 cm LA Vol (A2C):   61.4 ml 31.31 ml/m RA Volume:   33.90 ml  17.29 ml/m LA Vol (A4C):   46.9 ml 23.92 ml/m LA Biplane Vol: 55.0 ml 28.05 ml/m  AORTIC VALVE AV Area (Vmax):    1.08 cm AV Area (Vmean):   1.08 cm AV Area (VTI):     1.10 cm AV Vmax:           230.00 cm/s AV Vmean:          158.333 cm/s AV VTI:            0.500 m AV Peak Grad:      21.2 mmHg AV Mean Grad:      11.3 mmHg LVOT Vmax:         79.10 cm/s LVOT Vmean:        54.200 cm/s LVOT VTI:          0.175 m LVOT/AV VTI ratio: 0.35 AI PHT:            298 msec  AORTA Ao Root diam: 3.20 cm Ao Asc diam:  2.70 cm MITRAL VALVE                TRICUSPID VALVE MV Area (PHT): 3.72 cm     TR Peak grad:   29.4 mmHg MV Decel Time: 204  msec     TR Vmax:        271.00 cm/s MV E velocity: 115.00 cm/s MV A velocity: 122.00 cm/s  SHUNTS MV E/A ratio:  0.94         Systemic VTI:  0.18 m                             Systemic Diam: 2.00 cm Eleonore Chiquito MD Electronically signed by Eleonore Chiquito MD Signature Date/Time: 02/21/2019/1:19:24 PM    Final      TODAY-DAY OF DISCHARGE:  Subjective:   Jeanne Sims today has no headache,no chest abdominal pain,no new weakness tingling or numbness, feels much better wants to go home today.   Objective:   Blood pressure 132/62, pulse 73, temperature 98 F (36.7 C), temperature source Oral, resp. rate 17, height 5' 6"  (1.676 m), weight 86.1 kg, SpO2 94 %.  Intake/Output Summary (Last 24 hours) at 02/24/2019 1101 Last  data filed at 02/24/2019 0930 Gross per 24 hour  Intake 2455.61 ml  Output --  Net 2455.61 ml   Filed Weights   02/20/19 1112 02/20/19 1500 02/24/19 0613  Weight: 86.6 kg 86.6 kg 86.1 kg    Exam: Awake Alert, Oriented *3, No new F.N deficits, Normal affect [place].AT,PERRAL Supple Neck,No JVD, No cervical lymphadenopathy appriciated.  Symmetrical Chest wall movement, Good air movement bilaterally, CTAB RRR,No Gallops,Rubs or new Murmurs, No Parasternal Heave +ve B.Sounds, Abd Soft, Non tender, No organomegaly appriciated, No rebound -guarding or rigidity. No Cyanosis, Clubbing or edema, No new Rash or bruise   PERTINENT RADIOLOGIC STUDIES: CT ANGIO HEAD W OR WO CONTRAST  Result Date: 02/22/2019 CLINICAL DATA:  Right lower extremity sudden weakness, on heparin drip; stroke/TIA, assess intracranial arteries. EXAM: CT ANGIOGRAPHY HEAD AND NECK TECHNIQUE: Multidetector CT imaging of the head and neck was performed using the standard protocol during bolus administration of intravenous contrast. Multiplanar CT image reconstructions and MIPs were obtained to evaluate the vascular anatomy. Carotid stenosis measurements (when applicable) are obtained utilizing NASCET criteria, using the distal internal carotid diameter as the denominator. 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 There is no evidence of acute intracranial hemorrhage or demarcated cortical infarction. No evidence of intracranial mass. No midline shift or extra-axial fluid collection. Mild generalized parenchymal atrophy Vascular: Reported separately. Skull: Normal. Negative for fracture or focal lesion. Sinuses: Minimal mucosal thickening within the left sphenoid sinus. Orbits: Visualized orbits demonstrate no acute  abnormality. Other: Sequela of previous left mastoidectomy. No significant mastoid effusion. Review of the MIP images confirms the above findings CTA NECK FINDINGS Aortic arch: Standard branching. Scattered calcified plaque within the visualized aortic arch. No significant innominate or proximal subclavian stenosis. Right carotid system: CCA and ICA patent within the neck without measurable stenosis. Mild mixed plaque within the proximal ICA. Left carotid system: CCA and ICA patent within the neck without measurable stenosis. Mild mixed plaque within the proximal ICA. Vertebral arteries: Left vertebral artery dominant. The vertebral arteries are patent within the neck without stenosis. Skeleton: No acute bony abnormality. Other neck: No neck mass or cervical lymphadenopathy. The thyroid gland is atrophic or surgically absent. Upper chest: Linear atelectasis and/or scarring within the left lung apex. Partially visualized small left pleural effusion. Review of the MIP images  confirms the above findings CTA HEAD FINDINGS Anterior circulation: The intracranial internal carotid arteries are patent with scattered calcified plaque but no significant stenosis. The M1 middle cerebral arteries are patent bilaterally without significant stenosis. No M2 proximal branch occlusion or high-grade proximal stenosis is identified. The anterior cerebral arteries are patent bilaterally without high-grade proximal stenosis. The A1 left ACA is slightly hypoplastic. No intracranial aneurysm is identified. Posterior circulation: The non dominant intracranial right vertebral artery is developmentally diminutive beyond the origin of the right PICA, although patent. The dominant intracranial left vertebral artery is patent without significant stenosis, as is the basilar artery. The posterior cerebral arteries are patent bilaterally without significant proximal stenosis. Posterior communicating arteries are poorly delineated and may be  hypoplastic or absent bilaterally. Venous sinuses: Within limitations of contrast timing, no convincing thrombus. Anatomic variants: As described Review of the MIP images confirms the above findings IMPRESSION: CT head: 1. 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 2. Within this limitation, no evidence of acute intracranial abnormality. 3. Mild generalized parenchymal atrophy. CTA neck: The bilateral common carotid, internal carotid and vertebral arteries are patent within the neck without measurable stenosis. Mild atherosclerotic plaque within the visualized aortic arch and carotid systems. CTA head: 1. No intracranial large vessel occlusion or proximal high-grade arterial stenosis. 2. Mild calcified plaque within the intracranial internal carotid arteries without stenosis. Electronically Signed   By: Kellie Simmering DO   On: 02/22/2019 17:59   CT HEAD WO CONTRAST  Result Date: 02/24/2019 CLINICAL DATA:  Stroke follow-up. Right-sided weakness and decreased sensation. EXAM: CT HEAD WITHOUT CONTRAST TECHNIQUE: Contiguous axial images were obtained from the base of the skull through the vertex without intravenous contrast. COMPARISON:  Two days ago FINDINGS: Brain: There is indistinct anterior right insula, likely artifact from the patient's cochlear implant as this does not correlate with the reported symptoms. No identified infarct, hemorrhage, hydrocephalus, or masslike finding. Vascular: No hyperdense vessel. Skull: Negative Sinuses/Orbits: Unremarkable left cochlear implant. IMPRESSION: Stable head CT.  No visible infarct. Electronically Signed   By: Monte Fantasia M.D.   On: 02/24/2019 09:42   CT ANGIO NECK W OR WO CONTRAST  Result Date: 02/22/2019 CLINICAL DATA:  Right lower extremity sudden weakness, on heparin drip; stroke/TIA, assess intracranial arteries. EXAM: CT ANGIOGRAPHY HEAD AND NECK TECHNIQUE: Multidetector CT  imaging of the head and neck was performed using the standard protocol during bolus administration of intravenous contrast. Multiplanar CT image reconstructions and MIPs were obtained to evaluate the vascular anatomy. Carotid stenosis measurements (when applicable) are obtained utilizing NASCET criteria, using the distal internal carotid diameter as the denominator. 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 There is no evidence of acute intracranial hemorrhage or demarcated cortical infarction. No evidence of intracranial mass. No midline shift or extra-axial fluid collection. Mild generalized parenchymal atrophy Vascular: Reported separately. Skull: Normal. Negative for fracture or focal lesion. Sinuses: Minimal mucosal thickening within the left sphenoid sinus. Orbits: Visualized orbits demonstrate no acute abnormality. Other: Sequela of previous left mastoidectomy. No significant mastoid effusion. Review of the MIP images confirms the above findings CTA NECK FINDINGS Aortic arch: Standard branching. Scattered calcified plaque within the visualized aortic arch. No significant innominate or proximal subclavian stenosis. Right carotid system: CCA and ICA patent  within the neck without measurable stenosis. Mild mixed plaque within the proximal ICA. Left carotid system: CCA and ICA patent within the neck without measurable stenosis. Mild mixed plaque within the proximal ICA. Vertebral arteries: Left vertebral artery dominant. The vertebral arteries are patent within the neck without stenosis. Skeleton: No acute bony abnormality. Other neck: No neck mass or cervical lymphadenopathy. The thyroid gland is atrophic or surgically absent. Upper chest: Linear atelectasis and/or scarring within the  left lung apex. Partially visualized small left pleural effusion. Review of the MIP images confirms the above findings CTA HEAD FINDINGS Anterior circulation: The intracranial internal carotid arteries are patent with scattered calcified plaque but no significant stenosis. The M1 middle cerebral arteries are patent bilaterally without significant stenosis. No M2 proximal branch occlusion or high-grade proximal stenosis is identified. The anterior cerebral arteries are patent bilaterally without high-grade proximal stenosis. The A1 left ACA is slightly hypoplastic. No intracranial aneurysm is identified. Posterior circulation: The non dominant intracranial right vertebral artery is developmentally diminutive beyond the origin of the right PICA, although patent. The dominant intracranial left vertebral artery is patent without significant stenosis, as is the basilar artery. The posterior cerebral arteries are patent bilaterally without significant proximal stenosis. Posterior communicating arteries are poorly delineated and may be hypoplastic or absent bilaterally. Venous sinuses: Within limitations of contrast timing, no convincing thrombus. Anatomic variants: As described Review of the MIP images confirms the above findings IMPRESSION: CT head: 1. 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 2. Within this limitation, no evidence of acute intracranial abnormality. 3. Mild generalized parenchymal atrophy. CTA neck: The bilateral common carotid, internal carotid and vertebral arteries are patent within the neck without measurable stenosis. Mild atherosclerotic plaque within the visualized aortic arch and carotid systems. CTA head: 1. No intracranial large vessel occlusion or proximal high-grade arterial stenosis. 2. Mild calcified plaque within the intracranial internal carotid arteries without stenosis. Electronically Signed   By: Kellie Simmering DO   On: 02/22/2019 17:59   CT Angio Chest PE W and/or Wo Contrast  Result Date: 02/20/2019 CLINICAL DATA:  Pleuritic chest pain with shortness of breath and tachypnea. Current COVID infection. New right leg pain. History of non-Hodgkin's lymphoma, cervical cancer, ovarian cancer. EXAM: CT ANGIOGRAPHY CHEST WITH CONTRAST TECHNIQUE: Multidetector CT imaging of the chest was performed using the standard protocol during bolus administration of intravenous contrast. Multiplanar CT image reconstructions and MIPs were obtained to evaluate the vascular anatomy. CONTRAST:  15m OMNIPAQUE IOHEXOL 350 MG/ML SOLN COMPARISON:  CT chest 11/10/2018. FINDINGS: Cardiovascular: Filling defect is identified in the right middle lobe medial and lateral segmental pulmonary arteries, in the anterior basal and medial basal right lower lobe segmental pulmonary arteries, and in a subsegmental pulmonary artery in the left lower lobe (image 207/6). No lobar are larger thrombus is identified, and as result, this case does not qualify for submassive pulmonary embolus, which necessitates lobar or larger thrombus based on our current protocols. Coronary, aortic arch, and branch vessel atherosclerotic vascular disease. Overall heart size within normal limits. 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 Although the patient has a current SARS-CoV-2 infection, we do not demonstrate the typical patchy ground-glass opacities of active COVID pneumonia. Trace left pleural effusion. Upper Abdomen: Stable 0.9 cm hypodense lesion in segment 4 of the liver on image 91/4, previously characterized as a cyst. 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 Review of the  MIP images confirms the above findings. IMPRESSION: 1. Acute segmental pulmonary emboli in the right middle lobe and right lower. Acute subsegmental embolus in the left lower lobe. Lobe overall clot burden is small. No lobar or larger thrombus is identified, accordingly this does not qualify as submassive pulmonary embolus based on current protocols. 2. Trace left pleural effusion. 3. Coronary, aortic arch, and branch vessel atherosclerotic vascular disease. Aortic Atherosclerosis (ICD10-I70.0). 4. 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 No lung opacities to suggest active pneumonia related to the patient's current SARS-CoV-2 infection. 5. 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 6. Stable hypodense lesion in segment 4 of the liver, previously characterized as a cyst. Critical Value/emergent results were called by telephone at the time of interpretation on 02/20/2019 at 2:05 pm to provider Dr. Marda Stalker , who verbally acknowledged these results. Electronically Signed   By: Van Clines M.D.   On: 02/20/2019 14:28   US Venous Img Lower Unilateral Right  Result Date: 02/20/2019 CLINICAL DATA:  Right calf cramping. EXAM: RIGHT LOWER EXTREMITY VENOUS DOPPLER ULTRASOUND TECHNIQUE: Gray-scale sonography with graded compression, as well as color Doppler and duplex ultrasound were performed to evaluate the lower extremity deep venous systems from the level of the common femoral vein and including the common femoral, femoral, profunda femoral, popliteal and calf veins including the posterior tibial, peroneal and gastrocnemius veins when visible. The superficial great saphenous vein was also interrogated. Spectral Doppler was utilized to evaluate flow at rest and with distal augmentation maneuvers in the common femoral, femoral and popliteal  veins. COMPARISON:  None. FINDINGS: Contralateral Common Femoral Vein: Respiratory phasicity is normal and symmetric with the symptomatic side. No evidence of thrombus. Normal compressibility. Common Femoral Vein: No evidence of thrombus. Normal compressibility, respiratory phasicity and response to augmentation. Saphenofemoral Junction: No evidence of thrombus. Normal compressibility and flow on color Doppler imaging. Profunda Femoral Vein: No evidence of thrombus. Normal compressibility and flow on color Doppler imaging. Femoral Vein: No evidence of thrombus. Normal compressibility, respiratory phasicity and response to augmentation. Popliteal Vein: No evidence of thrombus. Normal compressibility, respiratory phasicity and response to augmentation. Calf Veins: No evidence of thrombus. Normal compressibility and flow on color Doppler imaging. Superficial Great Saphenous Vein: No evidence of thrombus. Normal compressibility. Venous Reflux:  None. Other Findings: No evidence of superficial thrombophlebitis or abnormal fluid collection. IMPRESSION: No evidence of right lower extremity deep venous thrombosis. Electronically Signed   By: Aletta Edouard M.D.   On: 02/20/2019 13:21   ECHOCARDIOGRAM LIMITED  Result Date: 02/21/2019    ECHOCARDIOGRAM LIMITED REPORT   Patient Name:   [facility] Date of Exam: 02/21/2019 Medical Rec #:  [id number]       Height:       66.0 in Accession #:    [id number]      Weight:       191.0 lb Date of Birth:  1955/10/26        BSA:          1.96 m Patient Age:    20 years        BP:           114/49 mmHg  Patient Gender: F               HR:           77 bpm. Exam Location:  Inpatient Procedure: Limited Echo, Cardiac Doppler and Limited Color Doppler Indications:    I26.02 Pulmonary embolus  History:        Patient has no prior history of Echocardiogram examinations.                 Signs/Symptoms:Dyspnea and Murmur; Risk Factors:Diabetes. COVID                 Positive. Cancer. Thyroid  Disease.  Sonographer:    Tiffany Dance Referring Phys: Golinda  Sonographer Comments: No subcostal window. IMPRESSIONS  1. Left ventricular ejection fraction, by estimation, is 60 to 65%. The left ventricle has normal function. The left ventricle has no regional wall motion abnormalities. Left ventricular diastolic function could not be evaluated.  2. Right ventricular systolic function is normal. The right ventricular size is normal.  3. The mitral valve is degenerative. Trivial mitral valve regurgitation.  4. The aortic valve is tricuspid. Aortic valve regurgitation is mild. Mild aortic valve stenosis. Comparison(s): No prior Echocardiogram. FINDINGS  Left Ventricle: Left ventricular ejection fraction, by estimation, is 60 to 65%. The left ventricle has normal function. The left ventricle has no regional wall motion abnormalities. The left ventricular internal cavity size was normal in size. There is  no left ventricular hypertrophy. The left ventricular diastology could not be evaluated due to mitral annular calcification (moderate or greater). Right Ventricle: The right ventricular size is normal. No increase in right ventricular wall thickness. Right ventricular systolic function is normal. Left Atrium: Left atrial size was normal in size. Right Atrium: Right atrial size was normal in size. Pericardium: There is no evidence of pericardial effusion. Mitral Valve: The mitral valve is degenerative in appearance. Moderate mitral annular calcification. Trivial mitral valve regurgitation. Tricuspid Valve: The tricuspid valve is grossly normal. Tricuspid valve regurgitation is mild. Aortic Valve: The aortic valve is tricuspid. . There is moderate thickening and moderate calcification of the aortic valve. Aortic valve regurgitation is mild. Aortic regurgitation PHT measures 298 msec. Mild aortic stenosis is present. There is moderate  thickening of the aortic valve. There is moderate calcification of  the aortic valve. Aortic valve mean gradient measures 11.3 mmHg. Aortic valve peak gradient measures 21.2 mmHg. Aortic valve area, by VTI measures 1.10 cm. Pulmonic Valve: The pulmonic valve was grossly normal. Pulmonic valve regurgitation is mild. Aorta: The aortic root is normal in size and structure. Venous: The inferior vena cava was not well visualized. IAS/Shunts: No atrial level shunt detected by color flow Doppler.  LEFT VENTRICLE PLAX 2D LVIDd:         3.93 cm LVIDs:         2.90 cm LV PW:         0.90 cm LV IVS:        0.86 cm LVOT diam:     2.00 cm LV SV:         54.98 ml LV SV Index:   17.15 LVOT Area:     3.14 cm  RIGHT VENTRICLE RV Basal diam:  2.73 cm TAPSE (M-mode): 2.7 cm LEFT ATRIUM             Index       RIGHT ATRIUM           Index LA diam:  3.70 cm 1.89 cm/m  RA Area:     14.60 cm LA Vol (A2C):   61.4 ml 31.31 ml/m RA Volume:   33.90 ml  17.29 ml/m LA Vol (A4C):   46.9 ml 23.92 ml/m LA Biplane Vol: 55.0 ml 28.05 ml/m  AORTIC VALVE AV Area (Vmax):    1.08 cm AV Area (Vmean):   1.08 cm AV Area (VTI):     1.10 cm AV Vmax:           230.00 cm/s AV Vmean:          158.333 cm/s AV VTI:            0.500 m AV Peak Grad:      21.2 mmHg AV Mean Grad:      11.3 mmHg LVOT Vmax:         79.10 cm/s LVOT Vmean:        54.200 cm/s LVOT VTI:          0.175 m LVOT/AV VTI ratio: 0.35 AI PHT:            298 msec  AORTA Ao Root diam: 3.20 cm Ao Asc diam:  2.70 cm MITRAL VALVE                TRICUSPID VALVE MV Area (PHT): 3.72 cm     TR Peak grad:   29.4 mmHg MV Decel Time: 204 msec     TR Vmax:        271.00 cm/s MV E velocity: 115.00 cm/s MV A velocity: 122.00 cm/s  SHUNTS MV E/A ratio:  0.94         Systemic VTI:  0.18 m                             Systemic Diam: 2.00 cm Eleonore Chiquito MD Electronically signed by Eleonore Chiquito MD Signature Date/Time: 02/21/2019/1:19:24 PM    Final      PERTINENT LAB RESULTS: CBC: Recent Labs    02/23/19 0506 02/24/19 0500  WBC 16.7* 15.5*  HGB 11.4*  11.9*  HCT 35.0* 36.3  PLT 448* 440*   CMET CMP     Component Value Date/Time   NA 140 02/23/2019 0506   K 4.0 02/23/2019 0506   CL 109 02/23/2019 0506   CO2 24 02/23/2019 0506   GLUCOSE 137 (H) 02/23/2019 0506   BUN 5 (L) 02/23/2019 0506   CREATININE 0.62 02/23/2019 0506   CREATININE 0.70 10/27/2018 1401   CALCIUM 8.2 (L) 02/23/2019 0506   PROT 5.7 (L) 02/21/2019 0240   ALBUMIN 3.1 (L) 02/21/2019 0240   AST 15 02/21/2019 0240   AST 29 10/27/2018 1401   ALT 17 02/21/2019 0240   ALT 28 10/27/2018 1401   ALKPHOS 61 02/21/2019 0240   BILITOT 0.6 02/21/2019 0240   BILITOT 0.2 (L) 10/27/2018 1401   GFRNONAA >60 02/23/2019 0506   GFRNONAA >60 10/27/2018 1401   GFRAA >60 02/23/2019 0506   GFRAA >60 10/27/2018 1401    GFR Estimated Creatinine Clearance: 79.5 mL/min (by C-G formula based on SCr of 0.62 mg/dL). No results for input(s): LIPASE, AMYLASE in the last 72 hours. No results for input(s): CKTOTAL, CKMB, CKMBINDEX, TROPONINI in the last 72 hours. Invalid input(s): POCBNP No results for input(s): DDIMER in the last 72 hours. Recent Labs    02/24/19 0500  HGBA1C 7.2*   Recent Labs    02/24/19 0500  CHOL 134  HDL 35*  LDLCALC 72  TRIG 135  CHOLHDL 3.8   No results for input(s): TSH, T4TOTAL, T3FREE, THYROIDAB in the last 72 hours.  Invalid input(s): FREET3 No results for input(s): VITAMINB12, FOLATE, FERRITIN, TIBC, IRON, RETICCTPCT in the last 72 hours. Coags: No results for input(s): INR in the last 72 hours.  Invalid input(s): PT Microbiology: Recent Results (from the past 240 hour(s))  Respiratory Panel by RT PCR (Flu A&B, Covid) - Nasopharyngeal Swab     Status: Abnormal   Collection Time: 02/20/19  4:01 PM   Specimen: Nasopharyngeal Swab  Result Value Ref Range Status   SARS Coronavirus 2 by RT PCR POSITIVE (A) NEGATIVE Final    Comment: RESULT CALLED TO, READ BACK BY AND VERIFIED WITH: GOUGE S TN [id number] [id number] PHILLIPS C (NOTE) SARS-CoV-2 target  nucleic acids are DETECTED. SARS-CoV-2 RNA is generally detectable in upper respiratory specimens  during the acute phase of infection. Positive results are indicative of the presence of the identified virus, but do not rule out bacterial infection or co-infection with other pathogens not detected by the test. Clinical correlation with patient history and other diagnostic information is necessary to determine patient infection status. The expected result is Negative. Fact Sheet for Patients:  PinkCheek.be Fact Sheet for Healthcare Providers: GravelBags.it This test is not yet approved or cleared by the [place] FDA and  has been authorized for detection and/or diagnosis of SARS-CoV-2 by FDA under an Emergency Use Authorization (EUA).  This EUA will remain in effect (meaning this test can be used) for  the duration of  the COVID-19 declaration under Section 564(b)(1) of the Act, 21 U.S.C. section 360bbb-3(b)(1), unless the authorization is terminated or revoked sooner.    Influenza A by PCR NEGATIVE NEGATIVE Final   Influenza B by PCR NEGATIVE NEGATIVE Final    Comment: (NOTE) The Xpert Xpress SARS-CoV-2/FLU/RSV assay is intended as an aid in  the diagnosis of influenza from Nasopharyngeal swab specimens and  should not be used as a sole basis for treatment. Nasal washings and  aspirates are unacceptable for Xpert Xpress SARS-CoV-2/FLU/RSV  testing. Fact Sheet for Patients: PinkCheek.be Fact Sheet for Healthcare Providers: GravelBags.it This test is not yet approved or cleared by the [place] FDA and  has been authorized for detection and/or diagnosis of SARS-CoV-2 by  FDA under an Emergency Use Authorization (EUA). This EUA will remain  in effect (meaning this test can be used) for the duration of the  Covid-19 declaration under Section 564(b)(1) of the Act,  21  U.S.C. section 360bbb-3(b)(1), unless the authorization is  terminated or revoked. Performed at [facility], [address] [place], [place] [address]     FURTHER DISCHARGE INSTRUCTIONS:  Get Medicines reviewed and adjusted: Please take all your medications with you for your next visit with your Primary MD  Laboratory/radiological data: Please request your Primary MD to go over all hospital tests and procedure/radiological results at the follow up, please ask your Primary MD to get all Hospital records sent to his/her office.  In some cases, they will be blood work, cultures and biopsy results pending at the time of your discharge. Please request that your primary care M.D. goes through all the records of your hospital data and follows up on these results.  Also Note the following: If you experience worsening of your admission symptoms, develop shortness of breath, life threatening emergency, suicidal or homicidal thoughts you must seek medical attention immediately by calling 911  or calling your MD immediately  if symptoms less severe.  You must read complete instructions/literature along with all the possible adverse reactions/side effects for all the Medicines you take and that have been prescribed to you. Take any new Medicines after you have completely understood and accpet all the possible adverse reactions/side effects.   Do not drive when taking Pain medications or sleeping medications (Benzodaizepines)  Do not take more than prescribed Pain, Sleep and Anxiety Medications. It is not advisable to combine anxiety,sleep and pain medications without talking with your primary care practitioner  Special Instructions: If you have smoked or chewed Tobacco  in the last 2 yrs please stop smoking, stop any regular Alcohol  and or any Recreational drug use.  Wear Seat belts while driving.  Please note: You were cared for by a hospitalist during your hospital stay. Once  you are discharged, your primary care physician will handle any further medical issues. Please note that NO REFILLS for any discharge medications will be authorized once you are discharged, as it is imperative that you return to your primary care physician (or establish a relationship with a primary care physician if you do not have one) for your post hospital discharge needs so that they can reassess your need for medications and monitor your lab values.  Total Time spent coordinating discharge including counseling, education and face to face time equals  45 minutes.  Signed: Alayne Estrella 02/24/2019 11:01 AM

## 2019-02-24 NOTE — Progress Notes (Addendum)
Patient was discharged home by MD order; discharged instructions  review and give to patient with care notes and prescription; IV DIC; patient and her daughter refused HH; skin - rash on left arm and on the chest; patient will be escorted to the car by nurse tech via wheelchair.

## 2019-02-24 NOTE — Progress Notes (Signed)
STROKE TEAM PROGRESS NOTE   INTERVAL HISTORY I have reviewed history of presenting illness with the patient, electronic medical records and imaging films in PACS personally.  Jeanne Sims states her right leg numbness is still persistent mostly in the outer aspect but right-sided weakness appears to be improving.  CT scan shows no acute infarct.  CT angiograms from 02/22/2019 show no significant large vessel intracranial extracranial stenosis.  2D echo is unremarkable on 02/21/2019.  LDL cholesterol 72 mg percent and hemoglobin A1c 7.2.  Patient is already on anticoagulation for her pulmonary embolism. Vitals:   02/24/19 0300 02/24/19 0613 02/24/19 0619 02/24/19 1100  BP:  (!) 131/58 132/62 128/67  Pulse: 68 73 73 70  Resp: 14 14 17 14   Temp:  98 F (36.7 C)  98.3 F (36.8 C)  TempSrc:  Oral  Oral  SpO2: 95% 95% 94% 96%  Weight:  86.1 kg    Height:        CBC:  Recent Labs  Lab 02/20/19 1113 02/20/19 1113 02/21/19 0240 02/22/19 0350 02/23/19 0506 02/24/19 0500  WBC 23.2*   < > 23.2*   < > 16.7* 15.5*  NEUTROABS 14.3*  --  14.0*  --   --   --   HGB 14.3   < > 12.8   < > 11.4* 11.9*  HCT 43.5   < > 38.6   < > 35.0* 36.3  MCV 95.2   < > 94.4   < > 95.1 95.3  PLT 415*   < > 433*   < > 448* 440*   < > = values in this interval not displayed.    Basic Metabolic Panel:  Recent Labs  Lab 02/22/19 0350 02/23/19 0506  NA 137 140  K 3.9 4.0  CL 105 109  CO2 24 24  GLUCOSE 144* 137*  BUN 6* 5*  CREATININE 0.65 0.62  CALCIUM 8.2* 8.2*   Lipid Panel:     Component Value Date/Time   CHOL 134 02/24/2019 0500   TRIG 135 02/24/2019 0500   HDL 35 (L) 02/24/2019 0500   CHOLHDL 3.8 02/24/2019 0500   VLDL 27 02/24/2019 0500   LDLCALC 72 02/24/2019 0500   HgbA1c:  Lab Results  Component Value Date   HGBA1C 7.2 (H) 02/24/2019    IMAGING past 48 hours CT ANGIO HEAD W OR WO CONTRAST  Result Date: 02/22/2019 CLINICAL DATA:  Right lower extremity sudden weakness, on heparin drip;  stroke/TIA, assess intracranial arteries. EXAM: CT ANGIOGRAPHY HEAD AND NECK TECHNIQUE: Multidetector CT imaging of the head and neck was performed using the standard protocol during bolus administration of intravenous contrast. Multiplanar CT image reconstructions and MIPs were obtained to evaluate the vascular anatomy. Carotid stenosis measurements (when applicable) are obtained utilizing NASCET criteria, using the distal internal carotid diameter as the denominator. CONTRAST:  74mL OMNIPAQUE IOHEXOL 350 MG/ML SOLN COMPARISON:  Report from head CT 12/07/2013 (images unavailable) FINDINGS: CT HEAD FINDINGS Brain: Streak artifact from a left-sided cochlear implant obscures portions of the left cerebral hemisphere, limiting evaluation. There is no evidence of acute intracranial hemorrhage or demarcated cortical infarction. No evidence of intracranial mass. No midline shift or extra-axial fluid collection. Mild generalized parenchymal atrophy Vascular: Reported separately. Skull: Normal. Negative for fracture or focal lesion. Sinuses: Minimal mucosal thickening within the left sphenoid sinus. Orbits: Visualized orbits demonstrate no acute abnormality. Other: Sequela of previous left mastoidectomy. No significant mastoid effusion. Review of the MIP images confirms the above findings CTA NECK FINDINGS  Aortic arch: Standard branching. Scattered calcified plaque within the visualized aortic arch. No significant innominate or proximal subclavian stenosis. Right carotid system: CCA and ICA patent within the neck without measurable stenosis. Mild mixed plaque within the proximal ICA. Left carotid system: CCA and ICA patent within the neck without measurable stenosis. Mild mixed plaque within the proximal ICA. Vertebral arteries: Left vertebral artery dominant. The vertebral arteries are patent within the neck without stenosis. Skeleton: No acute bony abnormality. Other neck: No neck mass or cervical lymphadenopathy. The  thyroid gland is atrophic or surgically absent. Upper chest: Linear atelectasis and/or scarring within the left lung apex. Partially visualized small left pleural effusion. Review of the MIP images confirms the above findings CTA HEAD FINDINGS Anterior circulation: The intracranial internal carotid arteries are patent with scattered calcified plaque but no significant stenosis. The M1 middle cerebral arteries are patent bilaterally without significant stenosis. No M2 proximal branch occlusion or high-grade proximal stenosis is identified. The anterior cerebral arteries are patent bilaterally without high-grade proximal stenosis. The A1 left ACA is slightly hypoplastic. No intracranial aneurysm is identified. Posterior circulation: The non dominant intracranial right vertebral artery is developmentally diminutive beyond the origin of the right PICA, although patent. The dominant intracranial left vertebral artery is patent without significant stenosis, as is the basilar artery. The posterior cerebral arteries are patent bilaterally without significant proximal stenosis. Posterior communicating arteries are poorly delineated and may be hypoplastic or absent bilaterally. Venous sinuses: Within limitations of contrast timing, no convincing thrombus. Anatomic variants: As described Review of the MIP images confirms the above findings IMPRESSION: CT head: 1. 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 2. Within this limitation, no evidence of acute intracranial abnormality. 3. Mild generalized parenchymal atrophy. CTA neck: The bilateral common carotid, internal carotid and vertebral arteries are patent within the neck without measurable stenosis. Mild atherosclerotic plaque within the visualized aortic arch and carotid systems. CTA head: 1. No intracranial large vessel occlusion or proximal high-grade arterial stenosis. 2. Mild calcified  plaque within the intracranial internal carotid arteries without stenosis. Electronically Signed   By: Kellie Simmering DO   On: 02/22/2019 17:59   CT HEAD WO CONTRAST  Result Date: 02/24/2019 CLINICAL DATA:  Stroke follow-up. Right-sided weakness and decreased sensation. EXAM: CT HEAD WITHOUT CONTRAST TECHNIQUE: Contiguous axial images were obtained from the base of the skull through the vertex without intravenous contrast. COMPARISON:  Two days ago FINDINGS: Brain: There is indistinct anterior right insula, likely artifact from the patient's cochlear implant as this does not correlate with the reported symptoms. No identified infarct, hemorrhage, hydrocephalus, or masslike finding. Vascular: No hyperdense vessel. Skull: Negative Sinuses/Orbits: Unremarkable left cochlear implant. IMPRESSION: Stable head CT.  No visible infarct. Electronically Signed   By: Monte Fantasia M.D.   On: 02/24/2019 09:42   CT ANGIO NECK W OR WO CONTRAST  Result Date: 02/22/2019 CLINICAL DATA:  Right lower extremity sudden weakness, on heparin drip; stroke/TIA, assess intracranial arteries. EXAM: CT ANGIOGRAPHY HEAD AND NECK TECHNIQUE: Multidetector CT imaging of the head and neck was performed using the standard protocol during bolus administration of intravenous contrast. Multiplanar CT image reconstructions and MIPs were obtained to evaluate the vascular anatomy. Carotid stenosis measurements (when applicable) are obtained utilizing NASCET criteria, using the distal internal carotid diameter as the denominator. CONTRAST:  17mL OMNIPAQUE IOHEXOL 350 MG/ML SOLN COMPARISON:  Report from head CT 12/07/2013 (images unavailable) FINDINGS: CT HEAD FINDINGS Brain: Streak  artifact from a left-sided cochlear implant obscures portions of the left cerebral hemisphere, limiting evaluation. There is no evidence of acute intracranial hemorrhage or demarcated cortical infarction. No evidence of intracranial mass. No midline shift or extra-axial  fluid collection. Mild generalized parenchymal atrophy Vascular: Reported separately. Skull: Normal. Negative for fracture or focal lesion. Sinuses: Minimal mucosal thickening within the left sphenoid sinus. Orbits: Visualized orbits demonstrate no acute abnormality. Other: Sequela of previous left mastoidectomy. No significant mastoid effusion. Review of the MIP images confirms the above findings CTA NECK FINDINGS Aortic arch: Standard branching. Scattered calcified plaque within the visualized aortic arch. No significant innominate or proximal subclavian stenosis. Right carotid system: CCA and ICA patent within the neck without measurable stenosis. Mild mixed plaque within the proximal ICA. Left carotid system: CCA and ICA patent within the neck without measurable stenosis. Mild mixed plaque within the proximal ICA. Vertebral arteries: Left vertebral artery dominant. The vertebral arteries are patent within the neck without stenosis. Skeleton: No acute bony abnormality. Other neck: No neck mass or cervical lymphadenopathy. The thyroid gland is atrophic or surgically absent. Upper chest: Linear atelectasis and/or scarring within the left lung apex. Partially visualized small left pleural effusion. Review of the MIP images confirms the above findings CTA HEAD FINDINGS Anterior circulation: The intracranial internal carotid arteries are patent with scattered calcified plaque but no significant stenosis. The M1 middle cerebral arteries are patent bilaterally without significant stenosis. No M2 proximal branch occlusion or high-grade proximal stenosis is identified. The anterior cerebral arteries are patent bilaterally without high-grade proximal stenosis. The A1 left ACA is slightly hypoplastic. No intracranial aneurysm is identified. Posterior circulation: The non dominant intracranial right vertebral artery is developmentally diminutive beyond the origin of the right PICA, although patent. The dominant intracranial  left vertebral artery is patent without significant stenosis, as is the basilar artery. The posterior cerebral arteries are patent bilaterally without significant proximal stenosis. Posterior communicating arteries are poorly delineated and may be hypoplastic or absent bilaterally. Venous sinuses: Within limitations of contrast timing, no convincing thrombus. Anatomic variants: As described Review of the MIP images confirms the above findings IMPRESSION: CT head: 1. 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 2. Within this limitation, no evidence of acute intracranial abnormality. 3. Mild generalized parenchymal atrophy. CTA neck: The bilateral common carotid, internal carotid and vertebral arteries are patent within the neck without measurable stenosis. Mild atherosclerotic plaque within the visualized aortic arch and carotid systems. CTA head: 1. No intracranial large vessel occlusion or proximal high-grade arterial stenosis. 2. Mild calcified plaque within the intracranial internal carotid arteries without stenosis. Electronically Signed   By: Kellie Simmering DO   On: 02/22/2019 17:59    PHYSICAL EXAM Jeanne Sims not in distress. . Afebrile. Head is nontraumatic. Neck is supple without bruit.    Cardiac exam no murmur or gallop. Lungs are clear to auscultation. Distal pulses are well felt. Neurological Exam ;  Awake  Alert oriented x 3. Normal speech and language.eye movements full without nystagmus.fundi were not visualized. Vision acuity and fields appear normal. Hearing is normal. Palatal movements are normal. Face symmetric. Tongue midline. Normal strength, tone, reflexes and coordination. Rt leg lateral aspect decreased sensation. Gait deferred.  ASSESSMENT/PLAN Jeanne Sims is a 64 y.o. female with history of , cervical cancer, pulmonary embolism, COVID-19 dx 3 weeks ago presenting with SOB  found to have B PEs. Started on [facility]. In hospital  developed altered sensation in R leg and RUE weakness.   Stroke:   Small subcortical infarct secondary to small vessel disease source in pt w/ acute Covid infection hypercoagulability w/ PE on Eliquis  CT head 2/16 no acute abnormality. Mild atrophy.   CTA head mild plaque IC atherosclerosis   CTA neck mild plaque aortic arch and ICAs  CT head 2/18 no acute infarct  2D Echo EF 60-65%. No source of embolus   RLE dopplers neg  LDL 72  HgbA1c 7.2  aspirin 81 mg daily prior to admission, now on Eliquis (apixaban) daily for PE. Given pt on [facility], will not do further stroke workup. Continue Eliquis for secondary stroke prevention. No need for additional aspirin   Therapy recommendations:  HH PT and OT  Disposition:  pending   COVID-19 infection   Dx 3 weeks ago  Admitted for SOB  Bilateral PEs  IV heparin -> Eliquis  RLE dopplers neg  Hypertension  Stable . Permissive hypertension (OK if < 220/120) but gradually normalize in 5-7 days . Long-term BP goal normotensive  Hyperlipidemia  Home meds:  crestor 10, resumed in hospital  LDL 72, goal < 70  Continue statin at discharge  Will not use intensive statin given near normal LDL  Diabetes type II Uncontrolled  HgbA1c 7.2, goal < 7.0  Other Stroke Risk Factors  Obesity, Body mass index is 30.64 kg/m., recommend weight loss, diet and exercise as appropriate  Hx MVP/palpitations   Other Active Problems  Acquired deafness w/ cochlear implants  Hx cervical and ovarian cancer  Hx Hodgkins disease s/p XRT  Thyroid disease  Chronic leukocytosis/thrombocytosis    Hospital day # 4  Patient presented with right-sided numbness and weakness likely from a small left subcortical infarct not visualized on CT scan.  Doing an MRI is likely not going to change treatment plan as patient is already on Eliquis for anticoagulation of pulmonary embolism hence will cancel it.   Continue aggressive risk factor modification.  Patient may be discharged home after therapy consults.  Aggressive risk factor modification.  Discussed with Dr. Donalee Citrin.  Greater than 50% time during this 25-minute visit was spent on counseling and coordination of care and discussion about stroke prevention treatment and answering questions Antony Contras, MD To contact Stroke Continuity provider, please refer to [email or web address] After hours, contact General Neurology

## 2019-02-24 NOTE — TOC Transition Note (Signed)
Transition of Care [facility]) - CM/SW Discharge Note   Patient Details  Name: [name] MRN: [id number] Date of Birth: 1955/05/18  [facility] CM/SW Contact:  Maryclare Labrador, RN Phone Number: 02/24/2019, 11:08 AM   Clinical Narrative:   CM attempted to speak with pt on 02/23/19 regarding [facility] recommendations/orders however per bedside nurse pt deferred discharge discussion to her daughter [name].  CM was able to reach pts daughter today - daughter also agrees that pt does not need HH nor DME (pt also declined).  Pts daughter is a Marine scientist and informed CM that if pt needs outpt therapy she will ensure she get it post discharge.  Pt stays with her husband and mother -in-law.  [name]'s husband will transport pt home today at discharge.  Pt will discharge home on Eliqius - CM communicated ongoing copay to daughter.  TOC will deliver discharge meds.    Final next level of care: Home/Self Care Barriers to Discharge: Barriers Resolved   Patient Goals and CMS Choice     Choice offered to / list presented to : Adult Children  Discharge Placement                       Discharge Plan and Services                                     Social Determinants of Health (SDOH) Interventions     Readmission Risk Interventions No flowsheet data found.

## 2019-02-24 NOTE — Progress Notes (Signed)
Physical Therapy Treatment Patient Details Name: Jeanne Sims MRN: [id number] DOB: Jun 05, 1955 Today's Date: 02/24/2019    History of Present Illness Jeanne Sims is a 64 y.o. female with history of ovarian cancer, cervical cancer, lymphoma in remission and chronic leukocytosis thrombocytosis, mitral valve prolapse with palpitation on metoprolol, hyperlipidemia hypothyroidism diabetes mellitus was diagnosed with Covid infection around the last week of January about 3 weeks ago.  At that time patient had some headaches and upper respiratory symptoms and diarrhea.  Had resolved by February 5 and had gone back to work around February 8.  Last 2 days patient started getting increasing shortness of breath with minimal exertion with left-sided pleuritic chest pain. Pt found to have B PE. No evidence of DVT    PT Comments    Pt admitted with above diagnosis. Pt progressing well. No LOB and 22/24 on DGI which suggest pt is at low risk of falls.  Pt could do Outpt PT if her right LE numbness persists.  Pt currently with functional limitations due to endurance deficits and right LE numbness. Pt will benefit from skilled PT to increase their independence and safety with mobility to allow discharge to the venue listed below.    Follow Up Recommendations  Outpatient PT;Supervision - Intermittent     Equipment Recommendations  None recommended by PT    Recommendations for Other Services       Precautions / Restrictions Precautions Precautions: Fall Restrictions Weight Bearing Restrictions: No    Mobility  Bed Mobility               General bed mobility comments: Received in recliner chair  Transfers Overall transfer level: Independent Equipment used: None Transfers: Sit to/from [employer] to Stand: Independent Stand pivot transfers: Supervision(Supervision solely to ensure safety)          Ambulation/Gait Ambulation/Gait assistance:  Supervision;Independent Gait Distance (Feet): 550 Feet Assistive device: None Gait Pattern/deviations: Step-through pattern;Decreased stride length   Gait velocity interpretation: 1.31 - 2.62 ft/sec, indicative of limited community ambulator General Gait Details: Overall pt did not need any stabilizing for gait.  Pt able to ambulate on unit.  She feels she is weaker and has tingling in right LE but she feels she is close to her baseline.    Stairs             Wheelchair Mobility    Modified Rankin (Stroke Patients Only) Modified Rankin (Stroke Patients Only) Pre-Morbid Rankin Score: No symptoms Modified Rankin: Moderate disability     Balance Overall balance assessment: Needs assistance Sitting-balance support: No upper extremity supported;Feet supported Sitting balance-Leahy Scale: Fair     Standing balance support: No upper extremity supported;During functional activity Standing balance-Leahy Scale: Fair                   Standardized Balance Assessment Standardized Balance Assessment : Dynamic Gait Index   Dynamic Gait Index Level Surface: Normal Change in Gait Speed: Normal Gait with Horizontal Head Turns: Normal Gait with Vertical Head Turns: Normal Gait and Pivot Turn: Normal Step Over Obstacle: Mild Impairment Step Around Obstacles: Normal Steps: Mild Impairment Total Score: 22      Cognition Arousal/Alertness: Awake/alert Behavior During Therapy: WFL for tasks assessed/performed Overall Cognitive Status: Within Functional Limits for tasks assessed  Exercises      General Comments General comments (skin integrity, edema, etc.): VSS      Pertinent Vitals/Pain Pain Assessment: Faces Faces Pain Scale: Hurts little more Pain Location: tingling sensation Pain Descriptors / Indicators: Tingling Pain Intervention(s): Limited activity within patient's tolerance;Monitored during  session;Repositioned    Home Living                      Prior Function            PT Goals (current goals can now be found in the care plan section) Acute Rehab PT Goals Patient Stated Goal: to  go home Progress towards PT goals: Progressing toward goals    Frequency    Min 3X/week      PT Plan Discharge plan needs to be updated;Equipment recommendations need to be updated    Co-evaluation              AM-PAC PT "6 Clicks" Mobility   Outcome Measure  Help needed turning from your back to your side while in a flat bed without using bedrails?: None Help needed moving from lying on your back to sitting on the side of a flat bed without using bedrails?: None Help needed moving to and from a bed to a chair (including a wheelchair)?: None Help needed standing up from a chair using your arms (e.g., wheelchair or bedside chair)?: None Help needed to walk in hospital room?: None Help needed climbing 3-5 steps with a railing? : A Little 6 Click Score: 23    End of Session Equipment Utilized During Treatment: Gait belt Activity Tolerance: Patient limited by fatigue Patient left: in chair;with call bell/phone within reach Nurse Communication: Mobility status PT Visit Diagnosis: Muscle weakness (generalized) (M62.81)     Time: 1660-6004 PT Time Calculation (min) (ACUTE ONLY): 42 min  Charges:  $Gait Training: 23-37 mins $Self Care/Home Management: 8-22                     Baker Moronta W,PT Acute Rehabilitation Services Pager:  [phone]  Office:  [phone]     Denice Paradise 02/24/2019, 12:58 PM

## 2019-02-25 ENCOUNTER — Telehealth: Payer: Self-pay | Admitting: *Deleted

## 2019-02-25 NOTE — Care Management (Signed)
CM contacted by staff nurse requesting CM contact pt's daughter [name].  CM contacted [name] ; [name] requested outpt PT consult as stated in PT notes on yesterday post CM discussion with both pt and daughter.  Pt had an order for [facility] but politely declined prior to discharge.  Pt is however  interested in outpt as recommended by therapy.  Pt chose [address] location for outpt PT - CM made referral via [place].  NO other needs determined - CM signing off

## 2019-02-27 ENCOUNTER — Encounter (HOSPITAL_COMMUNITY): Payer: Self-pay

## 2019-02-27 ENCOUNTER — Other Ambulatory Visit: Payer: Self-pay

## 2019-02-27 ENCOUNTER — Inpatient Hospital Stay (HOSPITAL_COMMUNITY)
Admission: EM | Admit: 2019-02-27 | Discharge: 2019-03-04 | DRG: 371 | Disposition: A | Discharge status: Home / Self Care | Payer: Commercial Managed Care - PPO | Attending: Internal Medicine | Admitting: Internal Medicine

## 2019-02-27 ENCOUNTER — Emergency Department (HOSPITAL_COMMUNITY): Payer: Commercial Managed Care - PPO

## 2019-02-27 DIAGNOSIS — E1165 Type 2 diabetes mellitus with hyperglycemia: Secondary | ICD-10-CM | POA: Diagnosis present

## 2019-02-27 DIAGNOSIS — Z8049 Family history of malignant neoplasm of other genital organs: Secondary | ICD-10-CM

## 2019-02-27 DIAGNOSIS — Z85828 Personal history of other malignant neoplasm of skin: Secondary | ICD-10-CM | POA: Diagnosis not present

## 2019-02-27 DIAGNOSIS — Z86711 Personal history of pulmonary embolism: Secondary | ICD-10-CM

## 2019-02-27 DIAGNOSIS — Z8571 Personal history of Hodgkin lymphoma: Secondary | ICD-10-CM

## 2019-02-27 DIAGNOSIS — H9190 Unspecified hearing loss, unspecified ear: Secondary | ICD-10-CM | POA: Diagnosis present

## 2019-02-27 DIAGNOSIS — U071 COVID-19: Secondary | ICD-10-CM | POA: Diagnosis present

## 2019-02-27 DIAGNOSIS — Z9081 Acquired absence of spleen: Secondary | ICD-10-CM | POA: Diagnosis not present

## 2019-02-27 DIAGNOSIS — Z8541 Personal history of malignant neoplasm of cervix uteri: Secondary | ICD-10-CM

## 2019-02-27 DIAGNOSIS — E039 Hypothyroidism, unspecified: Secondary | ICD-10-CM | POA: Diagnosis present

## 2019-02-27 DIAGNOSIS — Z79890 Hormone replacement therapy (postmenopausal): Secondary | ICD-10-CM | POA: Diagnosis not present

## 2019-02-27 DIAGNOSIS — Z79899 Other long term (current) drug therapy: Secondary | ICD-10-CM | POA: Diagnosis not present

## 2019-02-27 DIAGNOSIS — R262 Difficulty in walking, not elsewhere classified: Secondary | ICD-10-CM | POA: Diagnosis present

## 2019-02-27 DIAGNOSIS — I69341 Monoplegia of lower limb following cerebral infarction affecting right dominant side: Secondary | ICD-10-CM | POA: Diagnosis not present

## 2019-02-27 DIAGNOSIS — Z8572 Personal history of non-Hodgkin lymphomas: Secondary | ICD-10-CM

## 2019-02-27 DIAGNOSIS — Z8249 Family history of ischemic heart disease and other diseases of the circulatory system: Secondary | ICD-10-CM

## 2019-02-27 DIAGNOSIS — Z833 Family history of diabetes mellitus: Secondary | ICD-10-CM | POA: Diagnosis not present

## 2019-02-27 DIAGNOSIS — Z7984 Long term (current) use of oral hypoglycemic drugs: Secondary | ICD-10-CM

## 2019-02-27 DIAGNOSIS — K922 Gastrointestinal hemorrhage, unspecified: Secondary | ICD-10-CM

## 2019-02-27 DIAGNOSIS — R202 Paresthesia of skin: Secondary | ICD-10-CM | POA: Diagnosis present

## 2019-02-27 DIAGNOSIS — R531 Weakness: Secondary | ICD-10-CM | POA: Diagnosis not present

## 2019-02-27 DIAGNOSIS — Z803 Family history of malignant neoplasm of breast: Secondary | ICD-10-CM | POA: Diagnosis not present

## 2019-02-27 DIAGNOSIS — A0472 Enterocolitis due to Clostridium difficile, not specified as recurrent: Secondary | ICD-10-CM | POA: Diagnosis present

## 2019-02-27 DIAGNOSIS — I6300 Cerebral infarction due to thrombosis of unspecified precerebral artery: Secondary | ICD-10-CM | POA: Diagnosis not present

## 2019-02-27 DIAGNOSIS — Z7901 Long term (current) use of anticoagulants: Secondary | ICD-10-CM | POA: Diagnosis not present

## 2019-02-27 DIAGNOSIS — I639 Cerebral infarction, unspecified: Secondary | ICD-10-CM | POA: Diagnosis present

## 2019-02-27 LAB — C DIFFICILE QUICK SCREEN W PCR REFLEX
C Diff antigen: POSITIVE — AB
C Diff interpretation: DETECTED
C Diff toxin: POSITIVE — AB

## 2019-02-27 LAB — COMPREHENSIVE METABOLIC PANEL
ALT: 17 U/L (ref 0–44)
AST: 15 U/L (ref 15–41)
Albumin: 3.1 g/dL — ABNORMAL LOW (ref 3.5–5.0)
Alkaline Phosphatase: 67 U/L (ref 38–126)
Anion gap: 10 (ref 5–15)
BUN: 9 mg/dL (ref 8–23)
CO2: 24 mmol/L (ref 22–32)
Calcium: 8.8 mg/dL — ABNORMAL LOW (ref 8.9–10.3)
Chloride: 103 mmol/L (ref 98–111)
Creatinine, Ser: 0.78 mg/dL (ref 0.44–1.00)
GFR calc Af Amer: >60 mL/min (ref >60)
GFR calc non Af Amer: >60 mL/min (ref >60)
Glucose, Bld: 154 mg/dL — ABNORMAL HIGH (ref 70–99)
Potassium: 4.2 mmol/L (ref 3.5–5.1)
Sodium: 137 mmol/L (ref 135–145)
Total Bilirubin: 0.3 mg/dL (ref 0.3–1.2)
Total Protein: 6.4 g/dL — ABNORMAL LOW (ref 6.5–8.1)

## 2019-02-27 LAB — CBC
HCT: 39.3 % (ref 36.0–46.0)
Hemoglobin: 13 g/dL (ref 12.0–15.0)
MCH: 31 pg (ref 26.0–34.0)
MCHC: 33.1 g/dL (ref 30.0–36.0)
MCV: 93.8 fL (ref 80.0–100.0)
Platelets: 515 10*3/uL — ABNORMAL HIGH (ref 150–400)
RBC: 4.19 MIL/uL (ref 3.87–5.11)
RDW: 13.3 % (ref 11.5–15.5)
WBC: 29.4 10*3/uL — ABNORMAL HIGH (ref 4.0–10.5)
nRBC: 0.1 % (ref 0.0–0.2)

## 2019-02-27 LAB — POC OCCULT BLOOD, ED: Fecal Occult Bld: POSITIVE — AB

## 2019-02-27 LAB — GLUCOSE, CAPILLARY
Glucose-Capillary: 89 mg/dL (ref 70–99)
Glucose-Capillary: 134 mg/dL — ABNORMAL HIGH (ref 70–99)

## 2019-02-27 LAB — LACTIC ACID, PLASMA: Lactic Acid, Venous: 0.9 mmol/L (ref 0.5–1.9)

## 2019-02-27 MED ORDER — APIXABAN 5 MG PO TABS: 10.0000 mg | ORAL_TABLET | Freq: Two times a day (BID) | ORAL | Status: DC

## 2019-02-27 MED ORDER — INSULIN ASPART 100 UNIT/ML ~~LOC~~ SOLN: 0.0000 [IU] | Freq: Every day | SUBCUTANEOUS | Status: DC

## 2019-02-27 MED ORDER — INSULIN ASPART 100 UNIT/ML ~~LOC~~ SOLN
0.0000 [IU] | Freq: Three times a day (TID) | SUBCUTANEOUS | Status: DC
  Administered 2019-02-28 – 2019-03-03 (×2): 3 [IU] via SUBCUTANEOUS

## 2019-02-27 MED ORDER — FOLIC ACID 1 MG PO TABS
1.0000 mg | ORAL_TABLET | Freq: Every day | ORAL | Status: DC
  Administered 2019-02-27 – 2019-03-04 (×6): 1 mg via ORAL
  Filled 2019-02-27 (×6): qty 1

## 2019-02-27 MED ORDER — APIXABAN 5 MG PO TABS
5.0000 mg | ORAL_TABLET | Freq: Two times a day (BID) | ORAL | Status: DC
  Administered 2019-03-02 – 2019-03-04 (×5): 5 mg via ORAL
  Filled 2019-02-27 (×5): qty 1

## 2019-02-27 MED ORDER — TRAMADOL HCL 50 MG PO TABS
50.0000 mg | ORAL_TABLET | Freq: Once | ORAL | Status: AC
  Administered 2019-02-27: 50 mg via ORAL
  Filled 2019-02-27: qty 1

## 2019-02-27 MED ORDER — PANTOPRAZOLE SODIUM 40 MG PO TBEC
40.0000 mg | DELAYED_RELEASE_TABLET | Freq: Every day | ORAL | Status: DC
  Administered 2019-02-27 – 2019-03-04 (×6): 40 mg via ORAL
  Filled 2019-02-27 (×6): qty 1

## 2019-02-27 MED ORDER — ACETAMINOPHEN 325 MG PO TABS
650.0000 mg | ORAL_TABLET | ORAL | Status: DC | PRN
  Administered 2019-02-27 – 2019-03-03 (×4): 650 mg via ORAL
  Filled 2019-02-27 (×5): qty 2

## 2019-02-27 MED ORDER — VANCOMYCIN 50 MG/ML ORAL SOLUTION
125.0000 mg | Freq: Four times a day (QID) | ORAL | Status: DC
  Administered 2019-02-27 – 2019-03-03 (×18): 125 mg via ORAL
  Filled 2019-02-27 (×23): qty 2.5

## 2019-02-27 MED ORDER — METOPROLOL SUCCINATE ER 25 MG PO TB24
12.5000 mg | ORAL_TABLET | Freq: Every day | ORAL | Status: DC
  Administered 2019-02-28 – 2019-03-04 (×5): 12.5 mg via ORAL
  Filled 2019-02-27 (×5): qty 1

## 2019-02-27 MED ORDER — ACETAMINOPHEN 160 MG/5ML PO SOLN: 650.0000 mg | ORAL | Status: DC | PRN

## 2019-02-27 MED ORDER — SODIUM CHLORIDE 0.9 % IV SOLN: INTRAVENOUS | Status: AC

## 2019-02-27 MED ORDER — APIXABAN 5 MG PO TABS
10.0000 mg | ORAL_TABLET | Freq: Two times a day (BID) | ORAL | Status: AC
  Administered 2019-02-27 – 2019-03-01 (×5): 10 mg via ORAL
  Filled 2019-02-27 (×6): qty 2

## 2019-02-27 MED ORDER — FERROUS SULFATE 325 (65 FE) MG PO TABS
325.0000 mg | ORAL_TABLET | Freq: Every day | ORAL | Status: DC
  Administered 2019-02-28 – 2019-03-04 (×5): 325 mg via ORAL
  Filled 2019-02-27 (×5): qty 1

## 2019-02-27 MED ORDER — ROSUVASTATIN CALCIUM 20 MG PO TABS
20.0000 mg | ORAL_TABLET | Freq: Every day | ORAL | Status: DC
  Administered 2019-02-27 – 2019-03-04 (×6): 20 mg via ORAL
  Filled 2019-02-27: qty 1
  Filled 2019-02-27: qty 4
  Filled 2019-02-27 (×4): qty 1

## 2019-02-27 MED ORDER — STROKE: EARLY STAGES OF RECOVERY BOOK
Freq: Once | Status: AC
  Filled 2019-02-27: qty 1

## 2019-02-27 MED ORDER — SODIUM CHLORIDE 0.9 % IV BOLUS
1000.0000 mL | Freq: Once | INTRAVENOUS | Status: AC
  Administered 2019-02-27: 1000 mL via INTRAVENOUS

## 2019-02-27 MED ORDER — LEVOTHYROXINE SODIUM 75 MCG PO TABS
175.0000 ug | ORAL_TABLET | Freq: Every day | ORAL | Status: DC
  Administered 2019-02-28 – 2019-03-04 (×5): 175 ug via ORAL
  Filled 2019-02-27 (×5): qty 1

## 2019-02-27 MED ORDER — ACETAMINOPHEN 650 MG RE SUPP: 650.0000 mg | RECTAL | Status: DC | PRN

## 2019-02-27 MED ORDER — LINACLOTIDE 145 MCG PO CAPS
290.0000 ug | ORAL_CAPSULE | Freq: Every day | ORAL | Status: DC
  Administered 2019-02-28: 290 ug via ORAL
  Filled 2019-02-27: qty 2

## 2019-02-27 NOTE — ED Notes (Signed)
Pt returned from CT at this time. Placed pt back on monitor, pt placed in position of comfort and advised of wait status.   

## 2019-02-27 NOTE — ED Notes (Signed)
Called pt's daughter, [name], to provide an update at this time.

## 2019-02-27 NOTE — ED Provider Notes (Signed)
[facility] EMERGENCY DEPARTMENT Provider Note   CSN: [id number] Arrival date & time: 02/27/19  9735     History Chief Complaint  Patient presents with  . Dizziness  . Headache    Jeanne Sims is a 64 y.o. female.  HPI    2 64 yo with recent covid infection January 23 2/14), ho ovarian /cervical ca/ lymphoma with chronic  Leukocytoisi, thrombocytosi, hypothyroidism, diabetes bilateral PE,now on eliquis, stroke during last hospitalization with right-sided weakness, presents today with complaints of increased right-sided weakness, headache, vomiting, and diarrhea.  LKN last night at 0830.  Also having increased right sided weakness of leg with difficulty walking.  Past Medical History:  Diagnosis Date  . Acquired deafness   . Cervical cancer ([place]) 1075  . Cervical cancer ([place])   . Diabetes mellitus without complication ([place])   . Dyspareunia   . Heart murmur   . Hodgkin disease ([place]) 1987  . Ovarian cancer [facility])    age 78  . Thyroid disease     Patient Active Problem List   Diagnosis Date Noted  . Cerebrovascular accident (CVA) ([place])   . HLD (hyperlipidemia) 02/21/2019  . Controlled type 2 diabetes mellitus with hyperglycemia ([place]) 02/21/2019  . Hypothyroidism 02/21/2019  . Acute pulmonary embolism ([place]) 02/21/2019  . COVID-19 virus infection 02/21/2019  . PE (physical exam), annual 02/20/2019  . Acute pulmonary embolism without acute cor pulmonale ([place]) 02/20/2019    Past Surgical History:  Procedure Laterality Date  . ABDOMINAL HYSTERECTOMY     TAH/BSO at age 84 d/t cervical CA  . BASAL CELL CARCINOMA EXCISION     face  . BOWEL RESECTION     followed splenectomy.  had bowel obstruction.  . COCHLEAR IMPLANT  1983   Dr. Thornell Mule  . SPLENECTOMY       OB History   No obstetric history on file.     Family History  Problem Relation Age of Onset  . Heart failure Mother   . Diabetes Mother   . Diabetes Brother   . Diabetes Sister   .  Cervical cancer Sister   . Diabetes Sister   . Cervical cancer Sister   . Diabetes Sister   . Diabetes Brother   . Breast cancer Paternal Uncle     Social History   Tobacco Use  . Smoking status: Never Smoker  . Smokeless tobacco: Never Used  Substance Use Topics  . Alcohol use: No  . Drug use: No    Home Medications Prior to Admission medications   Medication Sig Start Date End Date Taking? Authorizing Provider  apixaban (ELIQUIS) 5 MG TABS tablet Take 2 tablets (10 mg total) by mouth 2 (two) times daily for 5 days. LAST DOSE ON 03/01/19-BEFORE SWITCHING TO 5 MG TWICE DAILY ON 2/24 02/23/19 02/28/19  Ghimire, Henreitta Leber, MD  apixaban (ELIQUIS) 5 MG TABS tablet Take 1 tablet (5 mg total) by mouth 2 (two) times daily. START THIS DOSE ON 2/24 03/02/19   Ghimire, Henreitta Leber, MD  Cinnamon 500 MG capsule Take 2,000 mg by mouth 2 (two) times daily.    [provider]  Cyanocobalamin 1000 MCG/ML KIT Inject as directed every 30 (thirty) days.    [provider]  dexlansoprazole (DEXILANT) 60 MG capsule Take 60 mg by mouth daily.    [provider]  Dulaglutide (TRULICITY) 1.5 HG/9.9ME SOPN Inject 1.5 mg into the skin once a week.     [provider]  ferrous  sulfate 325 (65 FE) MG tablet Take 1 tablet (325 mg total) by mouth daily with breakfast. Please take with a source of vitamin C (orange juice or tablet with 500 units of Vitamin C). 11/19/18   Orson Slick, MD  folic acid (FOLVITE) 027 MCG tablet Take 800 mcg by mouth daily.    [provider]  levothyroxine (EUTHYROX) 175 MCG tablet Take 175 mcg by mouth daily before breakfast.    [provider]  linaclotide (LINZESS) 290 MCG CAPS capsule Take 290 mcg by mouth daily before breakfast.    [provider]  metFORMIN (GLUCOPHAGE) 500 MG tablet Take 500 mg by mouth 2 (two) times daily with a meal.    [provider]  metoprolol succinate (TOPROL-XL) 25 MG 24 hr tablet  Take 12.5 mg by mouth daily.     [provider]  polyethylene glycol (MIRALAX / GLYCOLAX) 17 g packet Take 17 g by mouth daily.    [provider]  rosuvastatin (CRESTOR) 20 MG tablet Take 1 tablet (20 mg total) by mouth daily. 02/24/19   Ghimire, Henreitta Leber, MD  Vitamin D, Ergocalciferol, (DRISDOL) 1.25 MG (50000 UT) CAPS capsule Take 50,000 Units by mouth every 7 (seven) days.    [provider]    Allergies    Patient has no known allergies.  Review of Systems   Review of Systems  Constitutional: Positive for fever.  All other systems reviewed and are negative.   Physical Exam Updated Vital Signs BP 126/63 (BP Location: Right Arm)   Pulse 86   Temp 97.9 F (36.6 C) (Oral)   Resp 17   Ht 1.676 m (5' 6" )   Wt 86.4 kg   SpO2 98%   BMI 30.74 kg/m   Physical Exam  ED Results / Procedures / Treatments   Labs (all labs ordered are listed, but only abnormal results are displayed) Labs Reviewed - No data to display  EKG None  Radiology No results found.  Procedures .Critical Care Performed by: Pattricia Boss, MD Authorized by: Pattricia Boss, MD   Critical care provider statement:    Critical care time (minutes):  45   Critical care end time:  02/27/2019 1:11 PM   Critical care was time spent personally by me on the following activities:  Discussions with consultants, evaluation of patient's response to treatment, examination of patient, ordering and performing treatments and interventions, ordering and review of laboratory studies, ordering and review of radiographic studies, pulse oximetry, re-evaluation of patient's condition, obtaining history from patient or surrogate and review of old charts   (including critical care time)  Medications Ordered in ED Medications - No data to display  ED Course  I have reviewed the triage vital signs and the nursing notes.  Pertinent labs & imaging results that were available during my care of the  patient were reviewed by me and considered in my medical decision making (see chart for details). 11:56 AM    Discussed with Dr. Leonel Ramsay.  Patient's weakness sounds similar to what she had before.  It may be more pronounced today.  I suspect this is partly due to her having some volume depletion and relative hypotension current blood pressure 112/52.  Dr. Leonel Ramsay agrees with plan for repeat head CT, there is no specific neurological intervention that would be different from prior.   MDM Rules/Calculators/A&P  1-weakness- with increased right side weak, no acute ct changes.  Discussed with Dr. Leonel Ramsay.  No further neurological evaluation needed as patient cannot have MRI and is having risk factors modified and is anticoagulated.  She will need PT for ambulation 2 nausea, vomiting, diarrhea.. C dif positive today Patient has new onset nausea vomiting diarrhea and although, has baseline leukocytosis, appears elevated from baseline with [place] ptosis to 29,400.  Lactic acid is normal.  She has had soft blood pressures with systolic 371 and diastolic 52 on last measure.  This could also be contributing to #1 both in generalized perfusion and hypoperfusion of previous areas of stroke. 3 GI bleeding patient with Hemoccult positive on Eliquis Plan admission for ongoing treatment and evaluation above 4- covid positive x one month  Discussed with Dr. Linda Hedges on call for hospitalist and will see for admission  Final Clinical Impression(s) / ED Diagnoses Final diagnoses:  Right sided weakness  C. difficile diarrhea  Gastrointestinal hemorrhage, unspecified gastrointestinal hemorrhage type    Rx / DC Orders ED Discharge Orders    None       Pattricia Boss, MD 02/27/19 1311

## 2019-02-27 NOTE — Progress Notes (Signed)
Unable to administer 1800 PO vanc due to medication not being on unit. Awaiting stroke book as well.

## 2019-02-27 NOTE — ED Notes (Signed)
Called pt's daughter [name] to advise her of her mother's admission.

## 2019-02-27 NOTE — Progress Notes (Signed)
Stroke swallow study completed with this RN at bedside. No issues noted/passed swallow.

## 2019-02-27 NOTE — ED Notes (Signed)
Critical results reported by [employer], LAB. Pt is CDIFF positive. Dr. Jeanell Sparrow made aware at this time.

## 2019-02-27 NOTE — ED Notes (Signed)
Pt's daughter, [name], called and updated on pt status.

## 2019-02-27 NOTE — ED Triage Notes (Signed)
Pt reports dizziness, "like the room is spinning around me", since yesterday and a headache on the left side that started this morning. Pt reports nausea and multiple episodes of diarrhea this morning. Pt denies any LOC or falls at this time.

## 2019-02-27 NOTE — ED Notes (Signed)
Pt transported to CT at this time.

## 2019-02-27 NOTE — H&P (Signed)
History and Physical    Jeanne Sims GFQ:[id number] DOB: 1955-10-25 DOA: 02/27/2019  PCP: Bonnita Nasuti, MD (Confirm with patient/family/NH records and if not entered, this has to be entered at [facility] point of entry) Patient coming from: home  I have personally briefly reviewed patient's old medical records in [place]  Chief Complaint: Right sided weakness -worsening  HPI: Jeanne Sims is a 64 y.o. female with medical history significant of recent covid infection with positive test 01/29/19 and  02/20/19 with no record of treatment with remedesivir or steroids, ho ovarian /cervical ca at age 58 / lymphoma 59. There is a history of chronic leukocytosis and thrombocytosis with last Hme./Onc visit 11/17/18 that documents negative w/u,  hypothyroidism, diabetes with last AiC 02/22/19 of 7.2%,  bilateral PE 02/20/19 now on eliquis/ Patient with stroke 02/22/19 with right-sided weakness, negative CT head x 2, negative CTA. She presents today with complaints of increased right-sided weakness, headache, vomiting, and diarrhea.  LKN last night at 0830.  Also having increased right sided weakness of leg with difficulty walking. (For level 3, the HPI must include 4+ descriptors: Location, Quality, Severity, Duration, Timing, Context, modifying factors, associated signs/symptoms and/or status of 3+ chronic problems.)  (Please avoid self-populating past medical history here) (The initial 2-3 lines should be focused and good to copy and paste in the HPI section of the daily progress note).  ED Course: Hemodynamically stable. Patient had epidose(s) of diarrhea in the ED. Stool tested positive for C. Diff and was heme positive. Neurology was consult: given the patient's recent history of CVA with Right LE weakness and being on loading dose of Eliquis thru 03/02/19 no acute intervention recommend. [facility] called to admit patient for IVF, treatment of C. Diff and PT/OT eval to determine disposition.   Review of  Systems: As per HPI otherwise 10 point review of systems negative.  Unacceptable ROS statements: "10 systems reviewed," "Extensive" (without elaboration).  Acceptable ROS statements: "All others negative," "All others reviewed and are negative," and "All others unremarkable," with at [place] documented Can't double dip - if using for HPI can't use for ROS  Past Medical History:  Diagnosis Date  . Acquired deafness   . Cervical cancer ([place]) 1075  . Cervical cancer ([place])   . Diabetes mellitus without complication ([place])   . Dyspareunia   . Heart murmur   . Hodgkin disease ([place]) 1987  . Ovarian cancer [facility])    age 75  . Thyroid disease     Past Surgical History:  Procedure Laterality Date  . ABDOMINAL HYSTERECTOMY     TAH/BSO at age 29 d/t cervical CA  . BASAL CELL CARCINOMA EXCISION     face  . BOWEL RESECTION     followed splenectomy.  had bowel obstruction.  . COCHLEAR IMPLANT  1983   Dr. Thornell Mule  . SPLENECTOMY     Soc Hx - Married 15 years, divorced. Single 15 years. Remarried, now for 16 years. She has one daughter, 3 grandchildren. Lives with her husband. 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   reports that she has never smoked. She has never used smokeless tobacco. She reports that she does not drink alcohol or use drugs.  No Known Allergies  Family History  Problem Relation Age of Onset  . Heart failure Mother   . Diabetes Mother   . Diabetes Brother   . Diabetes Sister   . Cervical cancer Sister   .  Diabetes Sister   . Cervical cancer Sister   . Diabetes Sister   . Diabetes Brother   . Breast cancer Paternal Uncle    Unacceptable: Noncontributory, unremarkable, or negative. Acceptable: Family history reviewed and not pertinent (If you reviewed it)  Prior to Admission medications   Medication Sig Start Date End Date Taking? Authorizing Provider  apixaban (ELIQUIS) 5 MG TABS tablet Take 2 tablets (10 mg total) by  mouth 2 (two) times daily for 5 days. LAST DOSE ON 03/01/19-BEFORE SWITCHING TO 5 MG TWICE DAILY ON 2/24 02/23/19 02/28/19 Yes Ghimire, Henreitta Leber, MD  dexlansoprazole (DEXILANT) 60 MG capsule Take 60 mg by mouth daily.   Yes [provider]  Dulaglutide (TRULICITY) 1.5 JH/4.1DE SOPN Inject 1.5 mg into the skin once a week.    Yes [provider]  ferrous sulfate 325 (65 FE) MG tablet Take 1 tablet (325 mg total) by mouth daily with breakfast. Please take with a source of vitamin C (orange juice or tablet with 500 units of Vitamin C). 11/19/18  Yes Orson Slick, MD  folic acid (FOLVITE) 081 MCG tablet Take 800 mcg by mouth daily.   Yes [provider]  levothyroxine (EUTHYROX) 175 MCG tablet Take 175 mcg by mouth daily before breakfast.   Yes [provider]  linaclotide (LINZESS) 290 MCG CAPS capsule Take 290 mcg by mouth daily before breakfast.   Yes [provider]  metFORMIN (GLUCOPHAGE) 500 MG tablet Take 500 mg by mouth 2 (two) times daily with a meal.   Yes [provider]  metoprolol succinate (TOPROL-XL) 25 MG 24 hr tablet Take 12.5 mg by mouth daily.    Yes [provider]  rosuvastatin (CRESTOR) 20 MG tablet Take 1 tablet (20 mg total) by mouth daily. 02/24/19  Yes Ghimire, Henreitta Leber, MD  Vitamin D, Ergocalciferol, (DRISDOL) 1.25 MG (50000 UT) CAPS capsule Take 50,000 Units by mouth every 7 (seven) days.   Yes [provider]  apixaban (ELIQUIS) 5 MG TABS tablet Take 1 tablet (5 mg total) by mouth 2 (two) times daily. START THIS DOSE ON 2/24 03/02/19   Ghimire, Henreitta Leber, MD  Cinnamon 500 MG capsule Take 2,000 mg by mouth 2 (two) times daily.    [provider]  Cyanocobalamin 1000 MCG/ML KIT Inject as directed every 30 (thirty) days.    [provider]  polyethylene glycol (MIRALAX / GLYCOLAX) 17 g packet Take 17 g by mouth daily.    [provider]    Physical Exam: Vitals:   02/27/19  0944 02/27/19 0945 02/27/19 1015 02/27/19 1115  BP:   101/77 (!) 112/52  Pulse:   91 85  Resp:   (!) 22 (!) 22  Temp:      TempSrc:      SpO2: 98%  94% 97%  Weight:  86.4 kg    Height:  5' 6" (1.676 m)      Constitutional: NAD, calm, comfortable Vitals:   02/27/19 0944 02/27/19 0945 02/27/19 1015 02/27/19 1115  BP:   101/77 (!) 112/52  Pulse:   91 85  Resp:   (!) 22 (!) 22  Temp:      TempSrc:      SpO2: 98%  94% 97%  Weight:  86.4 kg    Height:  5' 6" (1.676 m)     [place] - WNWD woman in no distress Eyes: PERRL, lids and conjunctivae normal ENMT: Mucous membranes are dry. Posterior pharynx clear of  any exudate or lesions.Normal dentition.  Neck: normal, supple, no masses, no thyromegaly Respiratory: clear to auscultation bilaterally, no wheezing, no crackles. Normal respiratory effort. No accessory muscle use.  Cardiovascular: Regular rate and rhythm, II/VI mm RSB, III/VI mm LSB, II/VI murmur [place] , norubs / gallops. No extremity edema. 2+ pedal pulses. No carotid bruits.  Abdomen: diffuse tenderness, no masses palpated. No hepatosplenomegaly. Bowel sounds positive.  Musculoskeletal: no clubbing / cyanosis. No joint deformity upper and lower extremities. Good ROM, no contractures. Normal muscle tone.  Skin: no rashes, lesions, ulcers. No induration Neurologic: CN 2-12 grossly intact. Sensation diminished to light touch right knee-thigh. Strength 5/5 Left UE, RUE, LLE; 3/5 RLE and ankle.  Psychiatric: Normal judgment and insight. Alert and oriented x 3. Normal mood.   (Anything < 9 systems with 2 bullets each down codes to level 1) (If patient refuses exam can't bill higher level) (Make sure to document decubitus ulcers present on admission -- if possible -- and whether patient has chronic indwelling catheter at time of admission)  Labs on Admission: I have personally reviewed following labs and imaging studies  CBC: Recent Labs  Lab 02/21/19 0240 02/22/19 0350  02/23/19 0506 02/24/19 0500 02/27/19 1021  WBC 23.2* 21.2* 16.7* 15.5* 29.4*  NEUTROABS 14.0*  --   --   --   --   HGB 12.8 12.0 11.4* 11.9* 13.0  HCT 38.6 36.8 35.0* 36.3 39.3  MCV 94.4 95.1 95.1 95.3 93.8  PLT 433* 435* 448* 440* 761*   Basic Metabolic Panel: Recent Labs  Lab 02/21/19 0240 02/22/19 0350 02/23/19 0506 02/27/19 1021  NA 140 137 140 137  K 3.9 3.9 4.0 4.2  CL 104 105 109 103  CO2 _0 GLUCOSE 137* 144* 137* 154*  BUN 10 6* 5* 9  CREATININE 0.75 0.65 0.62 0.78  CALCIUM 8.7* 8.2* 8.2* 8.8*   GFR: Estimated Creatinine Clearance: 79.7 mL/min (by C-G formula based on SCr of 0.78 mg/dL). Liver Function Tests: Recent Labs  Lab 02/21/19 0240 02/27/19 1021  AST 15 15  ALT 17 17  ALKPHOS 61 67  BILITOT 0.6 0.3  PROT 5.7* 6.4*  ALBUMIN 3.1* 3.1*   No results for input(s): LIPASE, AMYLASE in the last 168 hours. No results for input(s): AMMONIA in the last 168 hours. Coagulation Profile: No results for input(s): INR, PROTIME in the last 168 hours. Cardiac Enzymes: No results for input(s): CKTOTAL, CKMB, CKMBINDEX, TROPONINI in the last 168 hours. BNP (last 3 results) No results for input(s): PROBNP in the last 8760 hours. HbA1C: No results for input(s): HGBA1C in the last 72 hours. CBG: Recent Labs  Lab 02/23/19 0746 02/23/19 1204 02/23/19 1632 02/23/19 2119 02/24/19 0737  GLUCAP 128* 113* 116* 151* 127*   Lipid Profile: No results for input(s): CHOL, HDL, LDLCALC, TRIG, CHOLHDL, LDLDIRECT in the last 72 hours. Thyroid Function Tests: No results for input(s): TSH, T4TOTAL, FREET4, T3FREE, THYROIDAB in the last 72 hours. Anemia Panel: No results for input(s): VITAMINB12, FOLATE, FERRITIN, TIBC, IRON, RETICCTPCT in the last 72 hours. Urine analysis:    Component Value Date/Time   COLORURINE YELLOW 12/18/2009 [place] 12/18/2009 0842   LABSPEC 1.025 12/18/2009 0842   PHURINE 5.5 12/18/2009 0842   GLUCOSEU NEGATIVE  12/18/2009 0842   HGBUR TRACE (A) 12/18/2009 0842   BILIRUBINUR n 07/22/2012 [place] 12/18/2009 0842   PROTEINUR n 07/22/2012 0852   PROTEINUR NEGATIVE 12/18/2009 0842   UROBILINOGEN  negative 07/22/2012 0852   UROBILINOGEN 0.2 12/18/2009 0842   NITRITE n 07/22/2012 0852   NITRITE NEGATIVE 12/18/2009 0842   LEUKOCYTESUR Negative 07/22/2012 0852    Radiological Exams on Admission: CT Head Wo Contrast  Result Date: 02/27/2019 CLINICAL DATA:  Dizziness, headache EXAM: CT HEAD WITHOUT CONTRAST TECHNIQUE: Contiguous axial images were obtained from the base of the skull through the vertex without intravenous contrast. COMPARISON:  02/24/2019 FINDINGS: Brain: No evidence of acute infarction, hemorrhage, hydrocephalus, extra-axial collection or mass lesion/mass effect. Streak artifact from the patient's cochlear implant obscures evaluation, particularly involving the left occipital lobe. Mild subcortical white matter and periventricular small vessel ischemic changes. Vascular: Mild intracranial atherosclerosis. Skull: Normal. Negative for fracture or focal lesion. Sinuses/Orbits: The visualized paranasal sinuses are essentially clear. Left mastoidectomy. Other: None. IMPRESSION: No evidence of acute intracranial abnormality. Stable left cochlear implant with associated streak artifact. Electronically Signed   By: Julian Hy M.D.   On: 02/27/2019 12:32   DG Chest Port 1 View  Result Date: 02/27/2019 CLINICAL DATA:  Dizziness and left-sided headache with nausea and diarrhea. COVID-19 positive. EXAM: PORTABLE CHEST 1 VIEW COMPARISON:  08/03/2017 FINDINGS: Lungs are adequately inflated without focal airspace consolidation or effusion. Cardiomediastinal silhouette is normal. Remaining bones and soft tissues are unremarkable. IMPRESSION: No active disease. Electronically Signed   By: Marin Olp M.D.   On: 02/27/2019 10:34    EKG: Independently reviewed. Sinus  Rhythm  Assessment/Plan Active Problems:   Cerebrovascular accident (CVA) ([place])   C. difficile diarrhea   Controlled type 2 diabetes mellitus with hyperglycemia ([place])   COVID-19 virus infection   Hypothyroidism   CVA (cerebrovascular accident) ([place])  (please populate well all problems here in Problem List. (For example, if patient is on BP meds at home and you resume or decide to hold them, it is a problem that needs to be her. Same for CAD, COPD, HLD and so on)   1. C. Diff Colitis - patient did take a round of Augmentin for sinusitis 01/28/19 followed by a dose of diflucan. Records indicate Flagyl TID x 7 days 02/04/19 but patient does not recall this Rx. She does think she was treated with prednisone about that time. Now with heme positive diarrhea that is C.Diff positive Plan Vancomycin 157m quid x 10 days  Continue Linzess but not miralax  IV hydration with NS at 50cc/hr x 12 hrs and reassess  F/u CBC/Bmet  2. Neuro - patient with probable extension of previous CVA with RLE weakness and paresthesia. At last admission 2/14-2/18 she had CTA head/neck negative, Echo with MV regurg, AV regurg, nl LVEF. She had A1C 7.2 % and LDL was well controlled Plan Tele admit for rhythm monitoring  Continue Eliquis at loading dose of 10 mg bid thru 2/24 then 5 mg bid  PT/OT eval and tx, recommendations for continued tx.   3. Covid 19 positive - patient was covid positive 1/23 and 2/14. She never received Remdesivir or steroids as she was asymptomatic. She remains asymptomatic - does not meet criteria for treatment  4. Heme - patient with variable leukocytosis now more marked. She has had complete w/u as outpatient. Suspect acute leukocytosis 2/2 c. Diff colitis.   5. Hypothyroidism - continue homemeds  6. DM- controlled per home regimen Plan Hold trulicity and metformin while inpatient  SS coverage  DVT prophylaxis: Eliquis full dose (Lovenox/Heparin/SCD's/anticoagulated/None (if comfort  care) Code Status: full code  (Full/Partial (specify details) Family Communication: Spoke with Daughter MNena Alexander  RN preferred contact [phone] [facility] name, relationship. Do not write "discussed with patient". Specify tel # if discussed over the phone) Disposition Plan: TBD (specify when and where you expect patient to be discharged) Consults called: Neurology - Dr. Leonel Ramsay spoke with ED-MD (with names) Admission status: in-patient/tele (inpatient / obs / tele / medical floor / SDU)  PPE - provider wore full PPE properly donned and doffed.  Adella Hare MD [facility] Pager [phone]  If 7PM-7AM, please contact night-coverage [email or web address] Password [email or web address]  02/27/2019, 1:47 PM

## 2019-02-28 LAB — CBC WITH DIFFERENTIAL/PLATELET
Abs Immature Granulocytes: 0.08 10*3/uL — ABNORMAL HIGH (ref 0.00–0.07)
Basophils Absolute: 0.1 10*3/uL (ref 0.0–0.1)
Basophils Relative: 1 %
Eosinophils Absolute: 0.3 10*3/uL (ref 0.0–0.5)
Eosinophils Relative: 2 %
HCT: 36 % (ref 36.0–46.0)
Hemoglobin: 11.7 g/dL — ABNORMAL LOW (ref 12.0–15.0)
Immature Granulocytes: 0 %
Lymphocytes Relative: 19 %
Lymphs Abs: 4 10*3/uL (ref 0.7–4.0)
MCH: 30.7 pg (ref 26.0–34.0)
MCHC: 32.5 g/dL (ref 30.0–36.0)
MCV: 94.5 fL (ref 80.0–100.0)
Monocytes Absolute: 2.2 10*3/uL — ABNORMAL HIGH (ref 0.1–1.0)
Monocytes Relative: 11 %
Neutro Abs: 14.3 10*3/uL — ABNORMAL HIGH (ref 1.7–7.7)
Neutrophils Relative %: 67 %
Platelets: 519 10*3/uL — ABNORMAL HIGH (ref 150–400)
RBC: 3.81 MIL/uL — ABNORMAL LOW (ref 3.87–5.11)
RDW: 13.3 % (ref 11.5–15.5)
WBC: 21 10*3/uL — ABNORMAL HIGH (ref 4.0–10.5)
nRBC: 0.1 % (ref 0.0–0.2)

## 2019-02-28 LAB — COMPREHENSIVE METABOLIC PANEL
ALT: 15 U/L (ref 0–44)
AST: 17 U/L (ref 15–41)
Albumin: 2.9 g/dL — ABNORMAL LOW (ref 3.5–5.0)
Alkaline Phosphatase: 68 U/L (ref 38–126)
Anion gap: 11 (ref 5–15)
BUN: 6 mg/dL — ABNORMAL LOW (ref 8–23)
CO2: 24 mmol/L (ref 22–32)
Calcium: 8.3 mg/dL — ABNORMAL LOW (ref 8.9–10.3)
Chloride: 104 mmol/L (ref 98–111)
Creatinine, Ser: 0.66 mg/dL (ref 0.44–1.00)
GFR calc Af Amer: >60 mL/min (ref >60)
GFR calc non Af Amer: >60 mL/min (ref >60)
Glucose, Bld: 149 mg/dL — ABNORMAL HIGH (ref 70–99)
Potassium: 3.8 mmol/L (ref 3.5–5.1)
Sodium: 139 mmol/L (ref 135–145)
Total Bilirubin: 0.5 mg/dL (ref 0.3–1.2)
Total Protein: 6.2 g/dL — ABNORMAL LOW (ref 6.5–8.1)

## 2019-02-28 LAB — GLUCOSE, CAPILLARY
Glucose-Capillary: 104 mg/dL — ABNORMAL HIGH (ref 70–99)
Glucose-Capillary: 94 mg/dL (ref 70–99)
Glucose-Capillary: 128 mg/dL — ABNORMAL HIGH (ref 70–99)
Glucose-Capillary: 138 mg/dL — ABNORMAL HIGH (ref 70–99)
Glucose-Capillary: 128 mg/dL — ABNORMAL HIGH (ref 70–99)

## 2019-02-28 LAB — MAGNESIUM: Magnesium: 1.8 mg/dL (ref 1.7–2.4)

## 2019-02-28 MED ORDER — HYDROCORTISONE 1 % EX CREA
1.0000 "application " | TOPICAL_CREAM | Freq: Two times a day (BID) | CUTANEOUS | Status: DC
  Administered 2019-02-28 – 2019-03-04 (×8): 1 via TOPICAL
  Filled 2019-02-28: qty 28

## 2019-02-28 MED ORDER — HYDROCORTISONE 1 % EX CREA
1.0000 "application " | TOPICAL_CREAM | Freq: Two times a day (BID) | CUTANEOUS | Status: DC
  Filled 2019-02-28: qty 28.35

## 2019-02-28 NOTE — Plan of Care (Signed)
Discussed with Dr. Marylyn Ishihara. Pt still has right sided weakness, although serial CT was negative for acute change. Given her recent COVID and PE, her symptoms may indicate small infarct although not able to be seen on CT. Pt can not have MRI due to cochlear implant. Pt has been on eliquis since 2/14, OK to continue. She is also on crestor. LDL 76 and A1C 7.2. TTE normal EF. Stroke work up is complete. She may need outpt TCD bubble study to rule out PFO, no need to be done inpt since it will not change management. Recommend continue eliquis and statin, PT/OT evaluation. Will follow up in clinic in 4 weeks. I will put in order.   Rosalin Hawking, MD PhD Stroke Neurology 02/28/2019 1:52 PM

## 2019-02-28 NOTE — Evaluation (Signed)
Occupational Therapy Evaluation Patient Details Name: Jeanne Sims MRN: [id number] DOB: 11/14/1955 Today's Date: 02/28/2019    History of Present Illness Pt is a 64 y.o. 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 Tested (+) C-diff. Likely extension of previous CVA; head CT with no evidence of acute abnormality. PMH includes acquired deafness (s/p cochlear implant 1983), cervical CA, ovarian CA, DM, heart murmur.   Clinical Impression   Pt reports significant weakness in the short time she was at home and asking for a RW at discharge. Pt presents with R side weakness, decreased standing balance and poor endurance. She needs up to min assist for ADL and min guard assist for OOB mobility. Pt HOH, difficult to accurately assess cognition. Will follow acutely.    Follow Up Recommendations  Home health OT    Equipment Recommendations  (RW)    Recommendations for Other Services       Precautions / Restrictions Precautions Precautions: Fall Restrictions Weight Bearing Restrictions: No      Mobility Bed Mobility Overal bed mobility: Independent                Transfers Overall transfer level: Needs assistance Equipment used: None Transfers: Sit to/from Stand;Stand Pivot Transfers Sit to Stand: Min guard Stand pivot transfers: Min guard       General transfer comment: stood multiple times from EOB and recliner, min guard due to c/o dizziness; BP down to 88/62    Balance Overall balance assessment: Needs assistance Sitting-balance support: No upper extremity supported;Feet supported Sitting balance-Leahy Scale: Good     Standing balance support: No upper extremity supported;During functional activity Standing balance-Leahy Scale: Fair                             ADL either performed or  assessed with clinical judgement   ADL Overall ADL's : Needs assistance/impaired Eating/Feeding: Sitting;Independent Eating/Feeding Details (indicate cue type and reason): able to open packages  Grooming: Brushing hair;Sitting;Set up;Wash/dry hands;Wash/dry face   Upper Body Bathing: Minimal assistance;Sitting   Lower Body Bathing: Minimal assistance;Sit to/from stand   Upper Body Dressing : Minimal assistance;Sitting   Lower Body Dressing: Minimal assistance;Sit to/from stand   Toilet Transfer: Min guard;Stand-pivot             General ADL Comments: pt fatigues easily     Vision Baseline Vision/History: Wears glasses Wears Glasses: At all times Patient Visual Report: No change from baseline       Perception     Praxis      Pertinent Vitals/Pain Pain Assessment: Faces Faces Pain Scale: Hurts a little bit Pain Location: RLE Pain Descriptors / Indicators: Numbness Pain Intervention(s): Monitored during session;Limited activity within patient's tolerance     Hand Dominance Right   Extremity/Trunk Assessment Upper Extremity Assessment Upper Extremity Assessment: RUE deficits/detail RUE Deficits / Details: 3+/5 RUE Sensation: WNL   Lower Extremity Assessment Lower Extremity Assessment: Defer to PT evaluation RLE Deficits / Details: R hip flex <3/5, knee ext <3/5, knee flex 3/5, ankle DF <3/5, PF 3/5; reports lower leg sensation normal, anterior/lateral/medial thigh numb to light touch (pt can feel pressure) RLE Sensation: decreased light touch LLE Deficits / Details: Functionally 4/5 throughout   Cervical / Trunk Assessment Cervical / Trunk Assessment: Normal   Communication Communication Communication: HOH(hears best out of L ear)  Cognition Arousal/Alertness: Awake/alert Behavior During Therapy: WFL for tasks assessed/performed Overall Cognitive Status: Within Functional Limits for tasks assessed                                 General  Comments: difficult to assess cognition due to [facility]   General Comments  Seated BP 122/58, standing BP 88/62; SpO2 97% on RA, HR 106    Exercises     Shoulder Instructions      Home Living Family/patient expects to be discharged to:: Private residence Living Arrangements: Spouse/significant other Available Help at Discharge: Family;Available 24 hours/day Type of Home: House Home Access: Stairs to enter [employer] of Steps: 3 Entrance Stairs-Rails: Right;Left;Can reach both Home Layout: One level     Bathroom Shower/Tub: Teacher, early years/pre: Standard     Home Equipment: Kasandra Knudsen - single point   Additional Comments: Works full time      Prior Functioning/Environment Level of Independence: Independent        Comments: Prior to initial admission, independent and works. Has felt weak since d/c home and husband helping as needed        OT Problem List: Decreased strength      OT Treatment/Interventions: Self-care/ADL training;Therapeutic exercise;Energy conservation;Therapeutic activities;Patient/family education    OT Goals(Current goals can be found in the care plan section) Acute Rehab OT Goals Patient Stated Goal: Return home, regain energy OT Goal Formulation: With patient Time For Goal Achievement: 03/14/19 Potential to Achieve Goals: Good ADL Goals Pt Will Perform Grooming: with supervision;standing Pt Will Perform Lower Body Bathing: with supervision;sit to/from stand Pt Will Perform Lower Body Dressing: with supervision;sit to/from stand Pt Will Transfer to Toilet: with supervision;ambulating;regular height toilet Pt Will Perform Toileting - Clothing Manipulation and hygiene: with supervision;sit to/from stand Pt/caregiver will Perform Home Exercise Program: Right Upper extremity;Increased strength;With Supervision Additional ADL Goal #1: Pt will state at least 3 energy conservation strategies as instructed.  OT Frequency: Min  2X/week   Barriers to D/C:            Co-evaluation              AM-PAC OT "6 Clicks" Daily Activity     Outcome Measure Help from another person eating meals?: None Help from another person taking care of personal grooming?: A Little Help from another person toileting, which includes using toliet, bedpan, or urinal?: A Little Help from another person bathing (including washing, rinsing, drying)?: A Little Help from another person to put on and taking off regular upper body clothing?: A Little Help from another person to put on and taking off regular lower body clothing?: A Little 6 Click Score: 19   End of Session Equipment Utilized During Treatment: Gait belt  Activity Tolerance: Patient tolerated treatment well Patient left: in chair;with call bell/phone within reach  OT Visit Diagnosis: Muscle weakness (generalized) (M62.81)                Time: 2836-6294 OT Time Calculation (min): 23 min Charges:  OT General Charges $OT Visit: 1 Visit OT Evaluation $OT Eval Moderate Complexity: 1 Mod  Nestor Lewandowsky, OTR/L Acute Rehabilitation Services Pager: [phone] Office: [phone]  Malka So 02/28/2019, 11:59 AM

## 2019-02-28 NOTE — Evaluation (Signed)
Physical Therapy Evaluation Patient Details Name: DELANEE XIN MRN: [id number] DOB: 1955-09-13 Today's Date: 02/28/2019   History of Present Illness  Pt is a 64 y.o. 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 Tested (+) C-diff. Likely extension of previous CVA; head CT with no evidence of acute abnormality. PMH includes acquired deafness (s/p cochlear implant 1983), cervical CA, ovarian CA, DM, heart murmur.    Clinical Impression  Pt presents with an overall decrease in functional mobility secondary to above. PTA, pt independent and lives with husband; was only home ~1-2 days before readmission, husband had been helping as pt limited by fatigue and feeling sick. Today, pt able to mobilize with min guard; limited by symptomatic hypotension upon standing (see values below). Demonstrates 2-3/5 strength in RLE, decreased light touch sensation in R thigh. Pt would benefit from continued acute PT services to maximize functional mobility and independence prior to d/c with outpatient neuro PT services.  Seated BP 122/58 Standing BP 88/62    Follow Up Recommendations Outpatient PT;Supervision for mobility/OOB    Equipment Recommendations  Rolling walker with 5" wheels    Recommendations for Other Services       Precautions / Restrictions Precautions Precautions: Fall Restrictions Weight Bearing Restrictions: No      Mobility  Bed Mobility Overal bed mobility: Independent                Transfers Overall transfer level: Needs assistance Equipment used: None Transfers: Sit to/from Stand Sit to Stand: Min guard         General transfer comment: stood multiple times from EOB and recliner, min guard due to c/o dizziness; BP down to 88/62  Ambulation/Gait Ambulation/Gait assistance: Min guard Gait Distance  (Feet): 2 Feet Assistive device: None Gait Pattern/deviations: Step-through pattern;Decreased stride length     General Gait Details: Steps to recliner with min guard due to c/o dizziness; further distance limited secondary to orthostatic hypotension  Stairs            Wheelchair Mobility    Modified Rankin (Stroke Patients Only) Modified Rankin (Stroke Patients Only) Pre-Morbid Rankin Score: Moderate disability Modified Rankin: Moderately severe disability     Balance Overall balance assessment: Needs assistance Sitting-balance support: No upper extremity supported;Feet supported Sitting balance-Leahy Scale: Good     Standing balance support: No upper extremity supported;During functional activity Standing balance-Leahy Scale: Fair                               Pertinent Vitals/Pain Pain Assessment: Faces Faces Pain Scale: Hurts a little bit Pain Location: RLE Pain Descriptors / Indicators: Numbness Pain Intervention(s): Monitored during session    Home Living Family/patient expects to be discharged to:: Private residence Living Arrangements: Spouse/significant other Available Help at Discharge: Family;Available 24 hours/day Type of Home: House Home Access: Stairs to enter Entrance Stairs-Rails: Right;Left;Can reach both   Home Layout: One level Home Equipment: Kasandra Knudsen - single point Additional Comments: Works full time    Prior Function Level of Independence: Independent         Comments: Prior to initial admission, independent and works. Has felt weak since d/c home and husband helping as needed     Hand Dominance        Extremity/Trunk Assessment   Upper Extremity Assessment Upper Extremity Assessment: Defer to OT  evaluation    Lower Extremity Assessment Lower Extremity Assessment: RLE deficits/detail;Generalized weakness;LLE deficits/detail RLE Deficits / Details: R hip flex <3/5, knee ext <3/5, knee flex 3/5, ankle DF <3/5, PF  3/5; reports lower leg sensation normal, anterior/lateral/medial thigh numb to light touch (pt can feel pressure) RLE Sensation: decreased light touch LLE Deficits / Details: Functionally 4/5 throughout    Cervical / Trunk Assessment Cervical / Trunk Assessment: Normal  Communication   Communication: HOH  Cognition Arousal/Alertness: Awake/alert Behavior During Therapy: WFL for tasks assessed/performed Overall Cognitive Status: Within Functional Limits for tasks assessed                                 General Comments: Reports fatigue      General Comments General comments (skin integrity, edema, etc.): Seated BP 122/58, standing BP 88/62; SpO2 97% on RA, HR 106    Exercises     Assessment/Plan    PT Assessment Patient needs continued PT services  PT Problem List Decreased mobility;Decreased balance;Decreased activity tolerance;Decreased knowledge of use of DME;Decreased knowledge of precautions;Decreased strength;Cardiopulmonary status limiting activity       PT Treatment Interventions DME instruction;Gait training;Functional mobility training;Therapeutic activities;Therapeutic exercise;Balance training;Patient/family education;Stair training    PT Goals (Current goals can be found in the Care Plan section)  Acute Rehab PT Goals Patient Stated Goal: Return home, regain energy PT Goal Formulation: With patient Time For Goal Achievement: 03/14/19 Potential to Achieve Goals: Good    Frequency Min 3X/week   Barriers to discharge        Co-evaluation               AM-PAC PT "6 Clicks" Mobility  Outcome Measure Help needed turning from your back to your side while in a flat bed without using bedrails?: None Help needed moving from lying on your back to sitting on the side of a flat bed without using bedrails?: None Help needed moving to and from a bed to a chair (including a wheelchair)?: A Little Help needed standing up from a chair using your arms  (e.g., wheelchair or bedside chair)?: A Little Help needed to walk in hospital room?: A Little Help needed climbing 3-5 steps with a railing? : A Little 6 Click Score: 20    End of Session   Activity Tolerance: Treatment limited secondary to medical complications (Comment) Patient left: in chair;with call bell/phone within reach Nurse Communication: Mobility status PT Visit Diagnosis: Other abnormalities of gait and mobility (R26.89)    Time: 6659-9357 PT Time Calculation (min) (ACUTE ONLY): 27 min   Charges:   PT Evaluation $PT Eval Moderate Complexity: 1 Mod        Mabeline Caras, PT, DPT Acute Rehabilitation Services  Pager [phone] Office [place] 02/28/2019, 10:46 AM

## 2019-02-28 NOTE — Progress Notes (Signed)
Jeanne Sims  PROGRESS NOTE    Jeanne Sims  UKG:[id number] DOB: 08-14-1955 DOA: 02/27/2019 PCP: Bonnita Nasuti, MD   Brief Narrative:   Jeanne Sims is a 64 y.o. female with medical history significant of recent covid infectionwith positive test 01/29/19 and 02/20/19 with no record of treatment with remedesivir or steroids, ho ovarian /cervical ca at age 88 / lymphoma 7. There is a history of chronic leukocytosis and thrombocytosis with last Hme./Onc visit 11/17/18 that documents negative w/u,  hypothyroidism, diabetes with last [facility] 02/22/19 of 7.2%, bilateral PE 02/20/19 now on eliquis/ Patient with stroke2/16/21 with right-sided weakness, negative CT head x 2, negative CTA. She presents today with complaints of increased right-sided weakness, headache, vomiting, and diarrhea. LKN last night at 0830. Also having increased right sided weakness of leg with difficulty walking. In ED: Hemodynamically stable. Patient had epidose(s) of diarrhea in the ED. Stool tested positive for C. Diff and was heme positive. Neurology was consult: given the patient's recent history of CVA with Right LE weakness and being on loading dose of Eliquis thru 03/02/19 no acute intervention recommend. TRH called to admit patient for IVF, treatment of C. Diff and PT/OT eval to determine disposition.   02/28/19: Reports continue diarrhea. Otherwise, no complaints.    Assessment & Plan:   Active Problems:   Controlled type 2 diabetes mellitus with hyperglycemia ([place])   Hypothyroidism   COVID-19 virus infection   Cerebrovascular accident (CVA) ([place])   C. difficile diarrhea   CVA (cerebrovascular accident) ([place])  C. Diff Colitis      - patient did take a round of Augmentin for sinusitis 01/28/19 followed by a dose of diflucan.      - records indicate Flagyl TID x 7 days 02/04/19 but patient does not recall this Rx.     - now with diarrhea and pos for C diff     - 02/28/19: continue PO vanc     - hold linzess     - continue  fluids  Right side weakness     - patient with probable extension of previous CVA with RLE weakness and paresthesia.      - At last admission 2/14-2/18 she had CTA head/neck negative, Echo with MV regurg, AV regurg, nl LVEF. She had A1C 7.2 % and LDL was well controlled     - Continue Eliquis at loading dose of 10 mg bid thru 2/24 then 5 mg bid     - 02/28/19: spoke with neuro, nothing to add at this time; unable to complete MRI d/t cochlear implant     - PT : outpt PT, RW; OT: HHOT     - continue crestor  Covid 19 positive      - patient was covid positive 1/23 and 2/14 per report, but I don't see this record.      - She never received Remdesivir or steroids as she was asymptomatic.      - She remains asymptomatic - does not meet criteria for treatment     - 02/28/19: if she was positive on 1/23, she is outside the infectivity period, looking for these records  Hypothyroidism      - continue levothyroxine  DM2     - controlled per home regimen     - Hold trulicity and metformin while inpatient     - continue SSI     - 02/28/19: DM diet, glucose ok, monitor  DVT prophylaxis: eliquis Code Status: FULL Family Communication: Attempted  to contact listed spouse by phone, but only received VM   Disposition Plan: Remains inpatient for vancomycin, IVF and ongoing diarrhea  Antimicrobials:  . PO vanc   ROS:  Denies CP, N, V, ab pain. Reports diarrhea . Remainder 10-pt ROS is negative for all not previously mentioned.  Subjective: "It's hard to hear."  Objective: Vitals:   02/27/19 2045 02/27/19 2223 02/28/19 0840 02/28/19 1021  BP: (!) 113/45 104/77 (!) 127/50 129/61  Pulse: 85 85 83 85  Resp: 20 18 16 18   Temp: 98.5 F (36.9 C) 98.8 F (37.1 C) 98.3 F (36.8 C)   TempSrc: Oral Oral Oral   SpO2: 97% 94% 96% 98%  Weight:      Height:        Intake/Output Summary (Last 24 hours) at 02/28/2019 1321 Last data filed at 02/28/2019 1000 Gross per 24 hour  Intake 519.17 ml   Output --  Net 519.17 ml   Filed Weights   02/27/19 0945  Weight: 86.4 kg    Examination:  General: 64 y.o. female resting in bed in NAD Cardiovascular: RRR, +S1, S2, no m/g/r Respiratory: CTABL, no w/r/r, normal WOB GI: BS+, NDNT, no masses noted, no organomegaly noted MSK: No e/c/c Neuro: Alert to name, follows commands Psyc: Appropriate interaction and affect, calm/cooperative   Data Reviewed: I have personally reviewed following labs and imaging studies.  CBC: Recent Labs  Lab 02/22/19 0350 02/23/19 0506 02/24/19 0500 02/27/19 1021 02/28/19 1033  WBC 21.2* 16.7* 15.5* 29.4* 21.0*  NEUTROABS  --   --   --   --  14.3*  HGB 12.0 11.4* 11.9* 13.0 11.7*  HCT 36.8 35.0* 36.3 39.3 36.0  MCV 95.1 95.1 95.3 93.8 94.5  PLT 435* 448* 440* 515* 829*   Basic Metabolic Panel: Recent Labs  Lab 02/22/19 0350 02/23/19 0506 02/27/19 1021 02/28/19 1033  NA 137 140 137 139  K 3.9 4.0 4.2 3.8  CL 105 109 103 104  CO2 24 24 24 24   GLUCOSE 144* 137* 154* 149*  BUN 6* 5* 9 6*  CREATININE 0.65 0.62 0.78 0.66  CALCIUM 8.2* 8.2* 8.8* 8.3*  MG  --   --   --  1.8   GFR: Estimated Creatinine Clearance: 79.7 mL/min (by C-G formula based on SCr of 0.66 mg/dL). Liver Function Tests: Recent Labs  Lab 02/27/19 1021 02/28/19 1033  AST 15 17  ALT 17 15  ALKPHOS 67 68  BILITOT 0.3 0.5  PROT 6.4* 6.2*  ALBUMIN 3.1* 2.9*   No results for input(s): LIPASE, AMYLASE in the last 168 hours. No results for input(s): AMMONIA in the last 168 hours. Coagulation Profile: No results for input(s): INR, PROTIME in the last 168 hours. Cardiac Enzymes: No results for input(s): CKTOTAL, CKMB, CKMBINDEX, TROPONINI in the last 168 hours. BNP (last 3 results) No results for input(s): PROBNP in the last 8760 hours. HbA1C: No results for input(s): HGBA1C in the last 72 hours. CBG: Recent Labs  Lab 02/24/19 0737 02/27/19 1848 02/27/19 2109 02/28/19 0844 02/28/19 1221  GLUCAP 127* 89 134*  104* 94   Lipid Profile: No results for input(s): CHOL, HDL, LDLCALC, TRIG, CHOLHDL, LDLDIRECT in the last 72 hours. Thyroid Function Tests: No results for input(s): TSH, T4TOTAL, FREET4, T3FREE, THYROIDAB in the last 72 hours. Anemia Panel: No results for input(s): VITAMINB12, FOLATE, FERRITIN, TIBC, IRON, RETICCTPCT in the last 72 hours. Sepsis Labs: Recent Labs  Lab 02/27/19 1115  LATICACIDVEN 0.9    Recent Results (  from the past 240 hour(s))  Respiratory Panel by RT PCR (Flu A&B, Covid) - Nasopharyngeal Swab     Status: Abnormal   Collection Time: 02/20/19  4:01 PM   Specimen: Nasopharyngeal Swab  Result Value Ref Range Status   SARS Coronavirus 2 by RT PCR POSITIVE (A) NEGATIVE Final    Comment: RESULT CALLED TO, READ BACK BY AND VERIFIED WITH: GOUGE S TN 1702 [id number] PHILLIPS C (NOTE) SARS-CoV-2 target nucleic acids are DETECTED. SARS-CoV-2 RNA is generally detectable in upper respiratory specimens  during the acute phase of infection. Positive results are indicative of the presence of the identified virus, but do not rule out bacterial infection or co-infection with other pathogens not detected by the test. Clinical correlation with patient history and other diagnostic information is necessary to determine patient infection status. The expected result is Negative. Fact Sheet for Patients:  PinkCheek.be Fact Sheet for Healthcare Providers: GravelBags.it This test is not yet approved or cleared by the [place] FDA and  has been authorized for detection and/or diagnosis of SARS-CoV-2 by FDA under an Emergency Use Authorization (EUA).  This EUA will remain in effect (meaning this test can be used) for  the duration of  the COVID-19 declaration under Section 564(b)(1) of the Act, 21 U.S.C. section 360bbb-3(b)(1), unless the authorization is terminated or revoked sooner.    Influenza A by PCR NEGATIVE NEGATIVE  Final   Influenza B by PCR NEGATIVE NEGATIVE Final    Comment: (NOTE) The Xpert Xpress SARS-CoV-2/FLU/RSV assay is intended as an aid in  the diagnosis of influenza from Nasopharyngeal swab specimens and  should not be used as a sole basis for treatment. Nasal washings and  aspirates are unacceptable for Xpert Xpress SARS-CoV-2/FLU/RSV  testing. Fact Sheet for Patients: PinkCheek.be Fact Sheet for Healthcare Providers: GravelBags.it This test is not yet approved or cleared by the [place] FDA and  has been authorized for detection and/or diagnosis of SARS-CoV-2 by  FDA under an Emergency Use Authorization (EUA). This EUA will remain  in effect (meaning this test can be used) for the duration of the  Covid-19 declaration under Section 564(b)(1) of the Act, 21  U.S.C. section 360bbb-3(b)(1), unless the authorization is  terminated or revoked. Performed at [facility], [address] [place], [place] [address]   C difficile quick scan w PCR reflex     Status: Abnormal   Collection Time: 02/27/19 11:15 AM   Specimen: Per Rectum; Stool  Result Value Ref Range Status   C Diff antigen POSITIVE (A) NEGATIVE Final    Comment: CRITICAL RESULT CALLED TO, READ BACK BY AND VERIFIED WITH: RN JENNIFER PAYAN @1248  02/26/18 AKT    C Diff toxin POSITIVE (A) NEGATIVE Final    Comment: CRITICAL RESULT CALLED TO, READ BACK BY AND VERIFIED WITH: RN JENNIFER PAYAN @1248  02/26/18 AKT    C Diff interpretation Toxin producing C. difficile detected.  Final    Comment: CRITICAL RESULT CALLED TO, READ BACK BY AND VERIFIED WITH: JENNIFER PAYAN @1248  02/26/18 AKT Performed at [facility], [place] [address] [place], [place] [address]   Blood culture (routine x 2)     Status: None (Preliminary result)   Collection Time: 02/27/19 11:16 AM   Specimen: BLOOD LEFT ARM  Result Value Ref Range Status   Specimen Description BLOOD LEFT  ARM  Final   Special Requests   Final    BOTTLES DRAWN AEROBIC AND ANAEROBIC Blood Culture results may not  be optimal due to an inadequate volume of blood received in culture bottles   Culture   Final    NO GROWTH < 24 HOURS Performed at [facility] [address], [place], [place] [address]    Report Status PENDING  Incomplete  Blood culture (routine x 2)     Status: None (Preliminary result)   Collection Time: 02/27/19 11:16 AM   Specimen: BLOOD RIGHT ARM  Result Value Ref Range Status   Specimen Description BLOOD RIGHT ARM  Final   Special Requests   Final    BOTTLES DRAWN AEROBIC AND ANAEROBIC Blood Culture adequate volume   Culture   Final    NO GROWTH < 24 HOURS Performed at [facility], [place] [address] [place], [place] [address]    Report Status PENDING  Incomplete      Radiology Studies: CT Head Wo Contrast  Result Date: 02/27/2019 CLINICAL DATA:  Dizziness, headache EXAM: CT HEAD WITHOUT CONTRAST TECHNIQUE: Contiguous axial images were obtained from the base of the skull through the vertex without intravenous contrast. COMPARISON:  02/24/2019 FINDINGS: Brain: No evidence of acute infarction, hemorrhage, hydrocephalus, extra-axial collection or mass lesion/mass effect. Streak artifact from the patient's cochlear implant obscures evaluation, particularly involving the left occipital lobe. Mild subcortical white matter and periventricular small vessel ischemic changes. Vascular: Mild intracranial atherosclerosis. Skull: Normal. Negative for fracture or focal lesion. Sinuses/Orbits: The visualized paranasal sinuses are essentially clear. Left mastoidectomy. Other: None. IMPRESSION: No evidence of acute intracranial abnormality. Stable left cochlear implant with associated streak artifact. Electronically Signed   By: Julian Hy M.D.   On: 02/27/2019 12:32   DG Chest Port 1 View  Result Date: 02/27/2019 CLINICAL DATA:  Dizziness and left-sided headache with  nausea and diarrhea. COVID-19 positive. EXAM: PORTABLE CHEST 1 VIEW COMPARISON:  08/03/2017 FINDINGS: Lungs are adequately inflated without focal airspace consolidation or effusion. Cardiomediastinal silhouette is normal. Remaining bones and soft tissues are unremarkable. IMPRESSION: No active disease. Electronically Signed   By: Marin Olp M.D.   On: 02/27/2019 10:34     Scheduled Meds: .  stroke: mapping our early stages of recovery book   Does not apply Once  . apixaban  10 mg Oral BID   Followed by  . [START ON 03/02/2019] apixaban  5 mg Oral BID  . ferrous sulfate  325 mg Oral Q breakfast  . folic acid  1 mg Oral Daily  . insulin aspart  0-20 Units Subcutaneous TID WC  . insulin aspart  0-5 Units Subcutaneous QHS  . levothyroxine  175 mcg Oral QAC breakfast  . linaclotide  290 mcg Oral QAC breakfast  . metoprolol succinate  12.5 mg Oral Daily  . pantoprazole  40 mg Oral Daily  . rosuvastatin  20 mg Oral Daily  . vancomycin  125 mg Oral QID   Continuous Infusions:   LOS: 1 day    Time spent: 25 minutes spent in coordination of care today.    Jonnie Finner, DO [facility]  If 7PM-7AM, please contact night-coverage [email or web address] 02/28/2019, 1:21 PM

## 2019-03-01 LAB — COMPREHENSIVE METABOLIC PANEL
ALT: 13 U/L (ref 0–44)
AST: 14 U/L — ABNORMAL LOW (ref 15–41)
Albumin: 2.8 g/dL — ABNORMAL LOW (ref 3.5–5.0)
Alkaline Phosphatase: 67 U/L (ref 38–126)
Anion gap: 10 (ref 5–15)
BUN: 5 mg/dL — ABNORMAL LOW (ref 8–23)
CO2: 25 mmol/L (ref 22–32)
Calcium: 8.3 mg/dL — ABNORMAL LOW (ref 8.9–10.3)
Chloride: 106 mmol/L (ref 98–111)
Creatinine, Ser: 0.63 mg/dL (ref 0.44–1.00)
GFR calc Af Amer: >60 mL/min (ref >60)
GFR calc non Af Amer: >60 mL/min (ref >60)
Glucose, Bld: 128 mg/dL — ABNORMAL HIGH (ref 70–99)
Potassium: 3.5 mmol/L (ref 3.5–5.1)
Sodium: 141 mmol/L (ref 135–145)
Total Bilirubin: 0.5 mg/dL (ref 0.3–1.2)
Total Protein: 6.3 g/dL — ABNORMAL LOW (ref 6.5–8.1)

## 2019-03-01 LAB — CBC WITH DIFFERENTIAL/PLATELET
Abs Immature Granulocytes: 0.06 10*3/uL (ref 0.00–0.07)
Basophils Absolute: 0.1 10*3/uL (ref 0.0–0.1)
Basophils Relative: 1 %
Eosinophils Absolute: 0.5 10*3/uL (ref 0.0–0.5)
Eosinophils Relative: 3 %
HCT: 35.8 % — ABNORMAL LOW (ref 36.0–46.0)
Hemoglobin: 12 g/dL (ref 12.0–15.0)
Immature Granulocytes: 0 %
Lymphocytes Relative: 25 %
Lymphs Abs: 4.2 10*3/uL — ABNORMAL HIGH (ref 0.7–4.0)
MCH: 30.8 pg (ref 26.0–34.0)
MCHC: 33.5 g/dL (ref 30.0–36.0)
MCV: 92 fL (ref 80.0–100.0)
Monocytes Absolute: 2 10*3/uL — ABNORMAL HIGH (ref 0.1–1.0)
Monocytes Relative: 12 %
Neutro Abs: 9.8 10*3/uL — ABNORMAL HIGH (ref 1.7–7.7)
Neutrophils Relative %: 59 %
Platelets: 520 10*3/uL — ABNORMAL HIGH (ref 150–400)
RBC: 3.89 MIL/uL (ref 3.87–5.11)
RDW: 13.1 % (ref 11.5–15.5)
WBC: 16.6 10*3/uL — ABNORMAL HIGH (ref 4.0–10.5)
nRBC: 0 % (ref 0.0–0.2)

## 2019-03-01 LAB — GLUCOSE, CAPILLARY
Glucose-Capillary: 108 mg/dL — ABNORMAL HIGH (ref 70–99)
Glucose-Capillary: 61 mg/dL — ABNORMAL LOW (ref 70–99)
Glucose-Capillary: 157 mg/dL — ABNORMAL HIGH (ref 70–99)
Glucose-Capillary: 109 mg/dL — ABNORMAL HIGH (ref 70–99)
Glucose-Capillary: 131 mg/dL — ABNORMAL HIGH (ref 70–99)

## 2019-03-01 LAB — MAGNESIUM: Magnesium: 2 mg/dL (ref 1.7–2.4)

## 2019-03-01 NOTE — Progress Notes (Signed)
Pt called for bathroom assistance. Assisted by staff.

## 2019-03-01 NOTE — Progress Notes (Signed)
Pt stated felt a little dizzy while standing. Assisted to bathroom.

## 2019-03-01 NOTE — Consult Note (Signed)
WOC Nurse Consult Note: Patient receiving care in [facility] 2W30. Reason for Consult: MASD_IT to breast folds Wound type: MASD-IT Pressure Injury POA: Yes/No/NA Measurement: Wound bed: moist, red Drainage (amount, consistency, odor) none Periwound: intact Dressing procedure/placement/frequency:  Measure and cut length of InterDry to fit in skin folds that have skin breakdown Tuck InterDry fabric into skin folds in a single layer, allow for 2 inches of overhang from skin edges to allow for wicking to occur May remove to bathe; dry area thoroughly and then tuck into affected areas again Do not apply any creams or ointments when using InterDry DO NOT THROW AWAY FOR 5 DAYS unless soiled with stool DO NOT [facility] product, this will inactivate the silver in the material  New sheet of Interdry should be applied after 5 days of use if patient continues to have skin breakdown  Monitor the wound area(s) for worsening of condition such as: Signs/symptoms of infection,  Increase in size,  Development of or worsening of odor, Development of pain, or increased pain at the affected locations.  Notify the medical team if any of these develop.  Thank you for the consult.  Discussed plan of care with the bedside nurse.  [place] nurse will not follow at this time.  Please re-consult the [place] team if needed.  Val Riles, RN, MSN, CWOCN, CNS-BC, pager [phone]

## 2019-03-01 NOTE — Progress Notes (Signed)
Discussed with pt this morning bowel movements. Pt stated had 3 BM since 0300/1 BM at 0700. Pt states that she has been walking to bathroom. Asked pt with staff. Pt stated no by myself. Pt stated that she was trying to walk with PT yesterday but her b/p became really low and that PT did not feel it safe for patient to be walking at that time. Pt informed that we would need to provide safety and patient placed back on fall protocol. Pt states that she hasn't been on fall protocol and why is this nurse the only one to do that. Pt informed that with the report of decrease in B/p and with the frequency of Bowel movements that we can assess the patient to determine if it would become safe for patient to move on her own or patient has the right to refuse fall protocol. Pt stated that you are doing this because I am white. Informed patient that fall protocols are for all patients that qualify, but patient has the right to refuse. Patient stated turn it off but you will be responsible for me if I fall. Pt educated on options and informed that patient assumes responsibility if they want the bed alarm off and fall. Patient expressed frustration that not enough staff is available to get to patient when needed. Informed patient that staff will assist, to please utilize the call bell. Pt voiced frustrations. Informed patient of plan for medication but to call if needed.

## 2019-03-01 NOTE — Progress Notes (Addendum)
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 There is a history ofchronic leukocytosis and thrombocytosis with last Hme./Onc visit 11/17/18 that documents negative w/u,hypothyroidism, diabetes with last AiC 02/22/19 of 7.2%,bilateral PE2/14/21now on eliquis/ Patient withstroke2/16/21with right-sided weakness, negative CT head x 2, negative CTA. Shepresents today with complaints of increased right-sided weakness, headache, vomiting, and diarrhea. LKN last night at 0830. Also having increased right sided weakness of leg with difficulty walking. In ED: Hemodynamically stable. Patient had epidose(s) of diarrhea in the ED. Stool tested positive for C. Diff and was heme positive. Neurology was consult: given the patient's recent history of CVA with Right LE weakness and being on loading dose of Eliquis thru 03/02/19 no acute intervention recommend. TRH called to admit patient for IVF, treatment of C. Diff and PT/OT eval to determine disposition.  03/01/19: Diarrhea slowing. WBC coming down. Afebrile. Hypoglycemic this afternoon. Correct on protocol. Continue vanc.   Assessment & Plan:   Active Problems:   Controlled type 2 diabetes mellitus with hyperglycemia ([place])   Hypothyroidism   COVID-19 virus infection   Cerebrovascular accident (CVA) ([place])   C. difficile diarrhea   CVA (cerebrovascular accident) ([place])  C. Diff Colitis      - patient did take a round of Augmentin for sinusitis 01/28/19 followed by a dose of diflucan.      - records indicate Flagyl TID x 7 days 02/04/19 but patient does not recall this Rx.     - now with diarrhea and pos for C diff     -  02/28/19: continue PO vanc     - hold linzess     - continue fluids     - 03/01/19: she says D is slowing, continue vanc  Right side weakness     - patient with probable extension of previous CVA with RLE weakness and paresthesia.      - At last admission 2/14-2/18 she had CTA head/neck negative, Echo with MV regurg, AV regurg, nl LVEF. She had A1C 7.2 % and LDL was well controlled     - Continue Eliquis at loading dose of 10 mg bid thru 2/24 then 5 mg bid     - 02/28/19: spoke with neuro, nothing to add at this time; unable to complete MRI d/t cochlear implant     - PT : outpt PT, RW; OT: HHOT     - continue crestor     - 03/01/19: no changes to above  Covid 19 positive      - patient was covid positive 1/23 and 2/14 per report, but I don't see this record.      - She never received Remdesivir or steroids as she was asymptomatic.      - She remains asymptomatic - does not meet criteria for treatment     - 02/28/19: if she was positive on 1/23, she is outside the infectivity period, looking for these records     - 03/01/19: says he COVID+ test was with her PCP; aSx, monitor  Hypothyroidism      - continue levothyroxine  DM2     - controlled per home regimen     - Hold trulicity and metformin while inpatient     -  continue SSI     - 02/28/19: DM diet, glucose ok, monitor     - 03/01/19: glucose is low this afternoon, replace on protocal, monitor  DVT prophylaxis: eliquis Code Status: FULL Family Communication: Updated Lysle Rubens by phone Disposition Plan: Remains inpt for vanc, IVF, and ongoing diarrhea  Antimicrobials:  . Vancomycin   ROS:  Denies N, V, ab pain, CP. Reports improving diarrhea. Remainder 10-pt ROS is negative for all not previously mentioned.  Subjective: "Why are my platelets going up?"  Objective: Vitals:   02/28/19 1021 02/28/19 1630 03/01/19 0030 03/01/19 0756  BP: 129/61 (!) 116/47 (!) 111/55 131/77  Pulse: 85 80 94 85  Resp: 18 18 19    Temp:  99 F  (37.2 C) 99 F (37.2 C) 97.8 F (36.6 C)  TempSrc:   Oral Oral  SpO2: 98% 95% 96% 99%  Weight:      Height:        Intake/Output Summary (Last 24 hours) at 03/01/2019 1320 Last data filed at 02/28/2019 1700 Gross per 24 hour  Intake 240 ml  Output --  Net 240 ml   Filed Weights   02/27/19 0945  Weight: 86.4 kg    Examination:  General: 64 y.o. female resting in bed in NAD Cardiovascular: RRR, +S1, S2, no m/g/r Respiratory: CTABL, no w/r/r, normal WOB GI: BS+, NDNT, soft MSK: No e/c/c Neuro: Alert to name, follows commands Psyc: Appropriate interaction and affect, calm/cooperative   Data Reviewed: I have personally reviewed following labs and imaging studies.  CBC: Recent Labs  Lab 02/23/19 0506 02/24/19 0500 02/27/19 1021 02/28/19 1033 03/01/19 0250  WBC 16.7* 15.5* 29.4* 21.0* 16.6*  NEUTROABS  --   --   --  14.3* 9.8*  HGB 11.4* 11.9* 13.0 11.7* 12.0  HCT 35.0* 36.3 39.3 36.0 35.8*  MCV 95.1 95.3 93.8 94.5 92.0  PLT 448* 440* 515* 519* 102*   Basic Metabolic Panel: Recent Labs  Lab 02/23/19 0506 02/27/19 1021 02/28/19 1033 03/01/19 0250  NA 140 137 139 141  K 4.0 4.2 3.8 3.5  CL 109 103 104 106  CO2 24 24 24 25   GLUCOSE 137* 154* 149* 128*  BUN 5* 9 6* 5*  CREATININE 0.62 0.78 0.66 0.63  CALCIUM 8.2* 8.8* 8.3* 8.3*  MG  --   --  1.8 2.0   GFR: Estimated Creatinine Clearance: 79.7 mL/min (by C-G formula based on SCr of 0.63 mg/dL). Liver Function Tests: Recent Labs  Lab 02/27/19 1021 02/28/19 1033 03/01/19 0250  AST 15 17 14*  ALT 17 15 13   ALKPHOS 67 68 67  BILITOT 0.3 0.5 0.5  PROT 6.4* 6.2* 6.3*  ALBUMIN 3.1* 2.9* 2.8*   No results for input(s): LIPASE, AMYLASE in the last 168 hours. No results for input(s): AMMONIA in the last 168 hours. Coagulation Profile: No results for input(s): INR, PROTIME in the last 168 hours. Cardiac Enzymes: No results for input(s): CKTOTAL, CKMB, CKMBINDEX, TROPONINI in the last 168 hours. BNP (last  3 results) No results for input(s): PROBNP in the last 8760 hours. HbA1C: No results for input(s): HGBA1C in the last 72 hours. CBG: Recent Labs  Lab 02/28/19 1626 02/28/19 1742 02/28/19 2104 03/01/19 0753 03/01/19 1251  GLUCAP 128* 138* 128* 108* 61*   Lipid Profile: No results for input(s): CHOL, HDL, LDLCALC, TRIG, CHOLHDL, LDLDIRECT in the last 72 hours. Thyroid Function Tests: No results for input(s): TSH, T4TOTAL, FREET4, T3FREE, THYROIDAB in the last 72 hours. Anemia Panel: No  results for input(s): VITAMINB12, FOLATE, FERRITIN, TIBC, IRON, RETICCTPCT in the last 72 hours. Sepsis Labs: Recent Labs  Lab 02/27/19 1115  LATICACIDVEN 0.9    Recent Results (from the past 240 hour(s))  Respiratory Panel by RT PCR (Flu A&B, Covid) - Nasopharyngeal Swab     Status: Abnormal   Collection Time: 02/20/19  4:01 PM   Specimen: Nasopharyngeal Swab  Result Value Ref Range Status   SARS Coronavirus 2 by RT PCR POSITIVE (A) NEGATIVE Final    Comment: RESULT CALLED TO, READ BACK BY AND VERIFIED WITH: GOUGE S TN 1702 [id number] PHILLIPS C (NOTE) SARS-CoV-2 target nucleic acids are DETECTED. SARS-CoV-2 RNA is generally detectable in upper respiratory specimens  during the acute phase of infection. Positive results are indicative of the presence of the identified virus, but do not rule out bacterial infection or co-infection with other pathogens not detected by the test. Clinical correlation with patient history and other diagnostic information is necessary to determine patient infection status. The expected result is Negative. Fact Sheet for Patients:  PinkCheek.be Fact Sheet for Healthcare Providers: GravelBags.it This test is not yet approved or cleared by the [place] FDA and  has been authorized for detection and/or diagnosis of SARS-CoV-2 by FDA under an Emergency Use Authorization (EUA).  This EUA will remain in  effect (meaning this test can be used) for  the duration of  the COVID-19 declaration under Section 564(b)(1) of the Act, 21 U.S.C. section 360bbb-3(b)(1), unless the authorization is terminated or revoked sooner.    Influenza A by PCR NEGATIVE NEGATIVE Final   Influenza B by PCR NEGATIVE NEGATIVE Final    Comment: (NOTE) The Xpert Xpress SARS-CoV-2/FLU/RSV assay is intended as an aid in  the diagnosis of influenza from Nasopharyngeal swab specimens and  should not be used as a sole basis for treatment. Nasal washings and  aspirates are unacceptable for Xpert Xpress SARS-CoV-2/FLU/RSV  testing. Fact Sheet for Patients: PinkCheek.be Fact Sheet for Healthcare Providers: GravelBags.it This test is not yet approved or cleared by the [place] FDA and  has been authorized for detection and/or diagnosis of SARS-CoV-2 by  FDA under an Emergency Use Authorization (EUA). This EUA will remain  in effect (meaning this test can be used) for the duration of the  Covid-19 declaration under Section 564(b)(1) of the Act, 21  U.S.C. section 360bbb-3(b)(1), unless the authorization is  terminated or revoked. Performed at [facility], [address] [place], [place] [address]   C difficile quick scan w PCR reflex     Status: Abnormal   Collection Time: 02/27/19 11:15 AM   Specimen: Per Rectum; Stool  Result Value Ref Range Status   C Diff antigen POSITIVE (A) NEGATIVE Final    Comment: CRITICAL RESULT CALLED TO, READ BACK BY AND VERIFIED WITH: RN JENNIFER PAYAN @1248  02/26/18 AKT    C Diff toxin POSITIVE (A) NEGATIVE Final    Comment: CRITICAL RESULT CALLED TO, READ BACK BY AND VERIFIED WITH: RN JENNIFER PAYAN @1248  02/26/18 AKT    C Diff interpretation Toxin producing C. difficile detected.  Final    Comment: CRITICAL RESULT CALLED TO, READ BACK BY AND VERIFIED WITH: JENNIFER PAYAN @1248  02/26/18 AKT Performed at [facility] [address] [place], [place] [address]   Blood culture (routine x 2)     Status: None (Preliminary result)   Collection Time: 02/27/19 11:16 AM   Specimen: BLOOD LEFT ARM  Result Value Ref Range  Status   Specimen Description BLOOD LEFT ARM  Final   Special Requests   Final    BOTTLES DRAWN AEROBIC AND ANAEROBIC Blood Culture results may not be optimal due to an inadequate volume of blood received in culture bottles   Culture   Final    NO GROWTH 2 DAYS Performed at [facility], [place] [address], [place], [place] [address]    Report Status PENDING  Incomplete  Blood culture (routine x 2)     Status: None (Preliminary result)   Collection Time: 02/27/19 11:16 AM   Specimen: BLOOD RIGHT ARM  Result Value Ref Range Status   Specimen Description BLOOD RIGHT ARM  Final   Special Requests   Final    BOTTLES DRAWN AEROBIC AND ANAEROBIC Blood Culture adequate volume   Culture   Final    NO GROWTH 2 DAYS Performed at [facility], [place] [address] [place], [place] [address]    Report Status PENDING  Incomplete      Radiology Studies: No results found.   Scheduled Meds: . apixaban  10 mg Oral BID   Followed by  . [START ON 03/02/2019] apixaban  5 mg Oral BID  . ferrous sulfate  325 mg Oral Q breakfast  . folic acid  1 mg Oral Daily  . hydrocortisone cream  1 application Topical BID  . insulin aspart  0-20 Units Subcutaneous TID WC  . insulin aspart  0-5 Units Subcutaneous QHS  . levothyroxine  175 mcg Oral QAC breakfast  . metoprolol succinate  12.5 mg Oral Daily  . pantoprazole  40 mg Oral Daily  . rosuvastatin  20 mg Oral Daily  . vancomycin  125 mg Oral QID   Continuous Infusions:   LOS: 2 days    Time spent: 25 minutes spent in the coordination of care today.    Jonnie Finner, DO [facility]  If 7PM-7AM, please contact night-coverage [email or web address] 03/01/2019, 1:20 PM

## 2019-03-02 LAB — CBC
HCT: 35.6 % — ABNORMAL LOW (ref 36.0–46.0)
Hemoglobin: 12 g/dL (ref 12.0–15.0)
MCH: 31.3 pg (ref 26.0–34.0)
MCHC: 33.7 g/dL (ref 30.0–36.0)
MCV: 92.7 fL (ref 80.0–100.0)
Platelets: 567 10*3/uL — ABNORMAL HIGH (ref 150–400)
RBC: 3.84 MIL/uL — ABNORMAL LOW (ref 3.87–5.11)
RDW: 13.1 % (ref 11.5–15.5)
WBC: 14.1 10*3/uL — ABNORMAL HIGH (ref 4.0–10.5)
nRBC: 0 % (ref 0.0–0.2)

## 2019-03-02 LAB — GLUCOSE, CAPILLARY
Glucose-Capillary: 115 mg/dL — ABNORMAL HIGH (ref 70–99)
Glucose-Capillary: 114 mg/dL — ABNORMAL HIGH (ref 70–99)
Glucose-Capillary: 100 mg/dL — ABNORMAL HIGH (ref 70–99)
Glucose-Capillary: 115 mg/dL — ABNORMAL HIGH (ref 70–99)

## 2019-03-02 NOTE — Progress Notes (Addendum)
PROGRESS NOTE    Jeanne Sims  GGY:[id number] DOB: 1955-01-22 DOA: 02/27/2019 PCP: Bonnita Nasuti, MD     Brief Narrative:  Jeanne Sims is a 64 y.o.femalewith medical history significant ofrecent covid infectionwith positive test 01/29/19 and2/14/21 with no record of treatment with remedesivir or steroids, history of ovarian/cervical cancerat age 59/ lymphoma1987. There is a history ofchronic leukocytosis and thrombocytosis with last Heme/Onc visit 11/17/18 that documents negative work up,hypothyroidism, diabetes with last Ha1c on 02/22/19 of 7.2%,bilateral PE2/14/21now on eliquis.  Patient withstroke2/16/21with right-sided weakness, negative CT head x 2, negative CTA. Shepresents now with complaints of increased right-sided weakness, headache, vomiting, and diarrhea. Also having increased right sided weakness of leg with difficulty walking. Patient had epidoses of diarrhea in the ED. Stool tested positive for C. Diff and was heme positive. Neurology was consulted  given the patient's recent history of CVA with right LE weakness and being on loading dose of Eliquis thru 03/02/19 no acute intervention recommend.   New events last 24 hours / Subjective: Patient states that last night, she only had 1 bowel movement.  However this morning after eating breakfast, she has had 3 mucousy bowel movement so far.  Also endorses some abdominal cramping.  During my examination, she had fecal urgency and had to use the restroom again.  Assessment & Plan:   Principal Problem:   C. difficile diarrhea Active Problems:   Controlled type 2 diabetes mellitus with hyperglycemia ([place])   Hypothyroidism   COVID-19 virus infection   Cerebrovascular accident (CVA) ([place])   CVA (cerebrovascular accident) ([place])   C. Diff Colitis  -Patient did take a round of Augmentin for sinusitis 01/28/19 followed by a dose of diflucan -Continue p.o. vancomycin  Right side weakness -Patient with probable  extension of previous CVA with RLE weakness and paresthesia. At last admission 2/14-2/18, she had CTA head/neck negative, Echo with MV regurg, AV regurg, normal LVEF. She had A1C 7.2 % and LDL was well controlled -Previous hospitalist spoke with neuro, nothing to add at this time; unable to complete MRI due to cochlear implant -Continue Eliquis, Crestor  Covid 19 positive  -Patient was covid positive 1/23 and 2/14 per patient report. She never received Remdesivir or steroids as she was asymptomatic -Remains asymptomatic, outside infective window, off precautions at this point -Remains asymptomatic on room air  Hypothyroidism  -Continue levothyroxine   DM2 -Hold trulicity and metformin while inpatient -Continue SSI   DVT prophylaxis: Eliquis Code Status: Full code Family Communication: None at bedside Disposition Plan:  . Patient is from home prior to admission. . Currently in-hospital treatment needed due to ongoing diarrhea, continue treatment for C. difficile colitis. . Suspect patient will discharge home in 1 to 2 days.   Consultants:   None  Procedures:   None  Antimicrobials:  Anti-infectives (From admission, onward)   Start     Dose/Rate Route Frequency Ordered Stop   02/27/19 1400  vancomycin (VANCOCIN) 50 mg/mL oral solution 125 mg     125 mg Oral 4 times daily 02/27/19 1346 03/09/19 1359        Objective: Vitals:   03/01/19 0756 03/01/19 1533 03/01/19 2126 03/02/19 0728  BP: 131/77 (!) 116/53 (!) 102/50 116/71  Pulse: 85 81 80 80  Resp:   16   Temp: 97.8 F (36.6 C) 98.7 F (37.1 C) 98.4 F (36.9 C) 98.6 F (37 C)  TempSrc: Oral Oral Oral Oral  SpO2: 99% 98% 95% 95%  Weight:  Height:       No intake or output data in the 24 hours ending 03/02/19 1145 Filed Weights   02/27/19 0945  Weight: 86.4 kg    Examination:  General exam: Appears calm and comfortable  Cardiovascular: Normal sinus rhythm on telemetry Respiratory: On room air  without conversational dyspnea Central nervous system: Alert and oriented. Speech clear.  Extremities: Symmetric in appearance  Psychiatry: Judgement and insight appear normal. Mood & affect appropriate.   Data Reviewed: I have personally reviewed following labs and imaging studies  CBC: Recent Labs  Lab 02/24/19 0500 02/27/19 1021 02/28/19 1033 03/01/19 0250  WBC 15.5* 29.4* 21.0* 16.6*  NEUTROABS  --   --  14.3* 9.8*  HGB 11.9* 13.0 11.7* 12.0  HCT 36.3 39.3 36.0 35.8*  MCV 95.3 93.8 94.5 92.0  PLT 440* 515* 519* 937*   Basic Metabolic Panel: Recent Labs  Lab 02/27/19 1021 02/28/19 1033 03/01/19 0250  NA 137 139 141  K 4.2 3.8 3.5  CL 103 104 106  CO2 24 24 25   GLUCOSE 154* 149* 128*  BUN 9 6* 5*  CREATININE 0.78 0.66 0.63  CALCIUM 8.8* 8.3* 8.3*  MG  --  1.8 2.0   GFR: Estimated Creatinine Clearance: 79.7 mL/min (by C-G formula based on SCr of 0.63 mg/dL). Liver Function Tests: Recent Labs  Lab 02/27/19 1021 02/28/19 1033 03/01/19 0250  AST 15 17 14*  ALT 17 15 13   ALKPHOS 67 68 67  BILITOT 0.3 0.5 0.5  PROT 6.4* 6.2* 6.3*  ALBUMIN 3.1* 2.9* 2.8*   No results for input(s): LIPASE, AMYLASE in the last 168 hours. No results for input(s): AMMONIA in the last 168 hours. Coagulation Profile: No results for input(s): INR, PROTIME in the last 168 hours. Cardiac Enzymes: No results for input(s): CKTOTAL, CKMB, CKMBINDEX, TROPONINI in the last 168 hours. BNP (last 3 results) No results for input(s): PROBNP in the last 8760 hours. HbA1C: No results for input(s): HGBA1C in the last 72 hours. CBG: Recent Labs  Lab 03/01/19 1251 03/01/19 1533 03/01/19 1755 03/01/19 2117 03/02/19 0722  GLUCAP 61* 157* 109* 131* 115*   Lipid Profile: No results for input(s): CHOL, HDL, LDLCALC, TRIG, CHOLHDL, LDLDIRECT in the last 72 hours. Thyroid Function Tests: No results for input(s): TSH, T4TOTAL, FREET4, T3FREE, THYROIDAB in the last 72 hours. Anemia Panel: No  results for input(s): VITAMINB12, FOLATE, FERRITIN, TIBC, IRON, RETICCTPCT in the last 72 hours. Sepsis Labs: Recent Labs  Lab 02/27/19 1115  LATICACIDVEN 0.9    Recent Results (from the past 240 hour(s))  Respiratory Panel by RT PCR (Flu A&B, Covid) - Nasopharyngeal Swab     Status: Abnormal   Collection Time: 02/20/19  4:01 PM   Specimen: Nasopharyngeal Swab  Result Value Ref Range Status   SARS Coronavirus 2 by RT PCR POSITIVE (A) NEGATIVE Final    Comment: RESULT CALLED TO, READ BACK BY AND VERIFIED WITH: GOUGE S TN 1702 [id number] PHILLIPS C (NOTE) SARS-CoV-2 target nucleic acids are DETECTED. SARS-CoV-2 RNA is generally detectable in upper respiratory specimens  during the acute phase of infection. Positive results are indicative of the presence of the identified virus, but do not rule out bacterial infection or co-infection with other pathogens not detected by the test. Clinical correlation with patient history and other diagnostic information is necessary to determine patient infection status. The expected result is Negative. Fact Sheet for Patients:  PinkCheek.be Fact Sheet for Healthcare Providers: GravelBags.it This test is not yet approved  or cleared by the [place] and  has been authorized for detection and/or diagnosis of SARS-CoV-2 by FDA under an Emergency Use Authorization (EUA).  This EUA will remain in effect (meaning this test can be used) for  the duration of  the COVID-19 declaration under Section 564(b)(1) of the Act, 21 U.S.C. section 360bbb-3(b)(1), unless the authorization is terminated or revoked sooner.    Influenza A by PCR NEGATIVE NEGATIVE Final   Influenza B by PCR NEGATIVE NEGATIVE Final    Comment: (NOTE) The Xpert Xpress SARS-CoV-2/FLU/RSV assay is intended as an aid in  the diagnosis of influenza from Nasopharyngeal swab specimens and  should not be used as a sole basis for  treatment. Nasal washings and  aspirates are unacceptable for Xpert Xpress SARS-CoV-2/FLU/RSV  testing. Fact Sheet for Patients: PinkCheek.be Fact Sheet for Healthcare Providers: GravelBags.it This test is not yet approved or cleared by the [place] FDA and  has been authorized for detection and/or diagnosis of SARS-CoV-2 by  FDA under an Emergency Use Authorization (EUA). This EUA will remain  in effect (meaning this test can be used) for the duration of the  Covid-19 declaration under Section 564(b)(1) of the Act, 21  U.S.C. section 360bbb-3(b)(1), unless the authorization is  terminated or revoked. Performed at [facility], [address] [place], [place] [address]   C difficile quick scan w PCR reflex     Status: Abnormal   Collection Time: 02/27/19 11:15 AM   Specimen: Per Rectum; Stool  Result Value Ref Range Status   C Diff antigen POSITIVE (A) NEGATIVE Final    Comment: CRITICAL RESULT CALLED TO, READ BACK BY AND VERIFIED WITH: RN Tawnee Clegg PAYAN @1248  02/26/18 AKT    C Diff toxin POSITIVE (A) NEGATIVE Final    Comment: CRITICAL RESULT CALLED TO, READ BACK BY AND VERIFIED WITH: RN Fariha Goto PAYAN @1248  02/26/18 AKT    C Diff interpretation Toxin producing C. difficile detected.  Final    Comment: CRITICAL RESULT CALLED TO, READ BACK BY AND VERIFIED WITH: Kashmir Lysaght PAYAN @1248  02/26/18 AKT Performed at [facility] [address], [place], [place] [address]   Blood culture (routine x 2)     Status: None (Preliminary result)   Collection Time: 02/27/19 11:16 AM   Specimen: BLOOD LEFT ARM  Result Value Ref Range Status   Specimen Description BLOOD LEFT ARM  Final   Special Requests   Final    BOTTLES DRAWN AEROBIC AND ANAEROBIC Blood Culture results may not be optimal due to an inadequate volume of blood received in culture bottles   Culture   Final    NO GROWTH 2 DAYS Performed at [facility], [place] [name] [place], [place] [address]    Report Status PENDING  Incomplete  Blood culture (routine x 2)     Status: None (Preliminary result)   Collection Time: 02/27/19 11:16 AM   Specimen: BLOOD RIGHT ARM  Result Value Ref Range Status   Specimen Description BLOOD RIGHT ARM  Final   Special Requests   Final    BOTTLES DRAWN AEROBIC AND ANAEROBIC Blood Culture adequate volume   Culture   Final    NO GROWTH 2 DAYS Performed at [facility], [place] [address] [place], [place] [address]    Report Status PENDING  Incomplete      Radiology Studies: No results found.    Scheduled Meds: . apixaban  5 mg Oral BID  .  ferrous sulfate  325 mg Oral Q breakfast  . folic acid  1 mg Oral Daily  . hydrocortisone cream  1 application Topical BID  . insulin aspart  0-20 Units Subcutaneous TID WC  . insulin aspart  0-5 Units Subcutaneous QHS  . levothyroxine  175 mcg Oral QAC breakfast  . metoprolol succinate  12.5 mg Oral Daily  . pantoprazole  40 mg Oral Daily  . rosuvastatin  20 mg Oral Daily  . vancomycin  125 mg Oral QID   Continuous Infusions:   LOS: 3 days      Time spent: 35 minutes   Jeanne Phi, DO [facility] 03/02/2019, 11:45 AM   Available via Epic secure chat 7am-7pm After these hours, please refer to coverage provider listed on [email or web address]

## 2019-03-02 NOTE — Progress Notes (Signed)
Physical Therapy Treatment Patient Details Name: Jeanne Sims MRN: [id number] DOB: March 15, 1955 Today's Date: 03/02/2019    History of Present Illness Pt is a 63 y.o. 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 Tested (+) C-diff. Likely extension of previous CVA; head CT with no evidence of acute abnormality. PMH includes acquired deafness (s/p cochlear implant 1983), cervical CA, ovarian CA, DM, heart murmur.    PT Comments    Pt with good improvement today.  Bps were stable with transfers.  Pt was able to ambulate 250' with supervision with and without RW.  Performed balance activities with facilitation to focus on R LE strengthening and balance.  Cont POC.    Follow Up Recommendations  Outpatient PT;Supervision for mobility/OOB     Equipment Recommendations  Rolling walker with 5" wheels    Recommendations for Other Services       Precautions / Restrictions Precautions Precautions: Fall    Mobility  Bed Mobility Overal bed mobility: Independent             General bed mobility comments: Received in recliner chair  Transfers Overall transfer level: Needs assistance Equipment used: None Transfers: Sit to/from Stand;Stand Pivot Transfers Sit to Stand: Supervision Stand pivot transfers: Supervision       General transfer comment: Performed multiple sit to stands from EOB  Ambulation/Gait Ambulation/Gait assistance: Supervision Gait Distance (Feet): 250 Feet Assistive device: Rolling walker (2 wheeled);None Gait Pattern/deviations: Step-through pattern;Decreased stance time - right Gait velocity: mild decrease   General Gait Details: no LOB but decreased stance time and foot clearance on right; cues for foot clearance.  Ambulated 100' with RW and 150' without RW   Stairs              Wheelchair Mobility    Modified Rankin (Stroke Patients Only)       Balance Overall balance assessment: Needs assistance Sitting-balance support: No upper extremity supported;Feet supported Sitting balance-Leahy Scale: Normal     Standing balance support: Single extremity supported;During functional activity Standing balance-Leahy Scale: Fair                 High Level Balance Comments: Worked on standing and target tapping with R foot x 10, reaching outside BOS forward x 10; standing marching requried min guard and use of L UE for balance            Cognition Arousal/Alertness: Awake/alert Behavior During Therapy: WFL for tasks assessed/performed Overall Cognitive Status: Within Functional Limits for tasks assessed                                        Exercises Other Exercises Other Exercises: sit to stand from slightly elevated surface with L LE forward to focus on R LE strength - performed x 6    General Comments General comments (skin integrity, edema, etc.): BP 106/59; standing 105/59; post walk 116/60      Pertinent Vitals/Pain Pain Assessment: No/denies pain    Home Living                      Prior Function            PT Goals (current goals can now be found in the care plan section) Progress towards PT goals: Progressing  toward goals    Frequency    Min 3X/week      PT Plan Current plan remains appropriate    Co-evaluation              AM-PAC PT "6 Clicks" Mobility   Outcome Measure  Help needed turning from your back to your side while in a flat bed without using bedrails?: None Help needed moving from lying on your back to sitting on the side of a flat bed without using bedrails?: None Help needed moving to and from a bed to a chair (including a wheelchair)?: None Help needed standing up from a chair using your arms (e.g., wheelchair or bedside chair)?: None Help needed to walk in hospital  room?: None Help needed climbing 3-5 steps with a railing? : A Little 6 Click Score: 23    End of Session Equipment Utilized During Treatment: Gait belt Activity Tolerance: Patient tolerated treatment well Patient left: in chair;with call bell/phone within reach Nurse Communication: Mobility status PT Visit Diagnosis: Other abnormalities of gait and mobility (R26.89)     Time: 1525-1550 PT Time Calculation (min) (ACUTE ONLY): 25 min  Charges:  $Gait Training: 8-22 mins $Neuromuscular Re-education: 8-22 mins                     Maggie Font, PT Acute Rehab Services Pager [phone] [facility] [phone] [facility] [place] 03/02/2019, 5:03 PM

## 2019-03-02 NOTE — Progress Notes (Signed)
   03/02/19 1500  OT Visit Information  Last OT Received On 03/02/19  Reason Eval/Treat Not Completed Patient at procedure or test/ unavailable (pt working with PT)  Nestor Lewandowsky, OTR/L [place] Pager: [phone] Office: [phone]

## 2019-03-03 LAB — GLUCOSE, CAPILLARY
Glucose-Capillary: 127 mg/dL — ABNORMAL HIGH (ref 70–99)
Glucose-Capillary: 70 mg/dL (ref 70–99)
Glucose-Capillary: 108 mg/dL — ABNORMAL HIGH (ref 70–99)
Glucose-Capillary: 120 mg/dL — ABNORMAL HIGH (ref 70–99)

## 2019-03-03 LAB — CBC
HCT: 37.3 % (ref 36.0–46.0)
Hemoglobin: 12.4 g/dL (ref 12.0–15.0)
MCH: 30.8 pg (ref 26.0–34.0)
MCHC: 33.2 g/dL (ref 30.0–36.0)
MCV: 92.8 fL (ref 80.0–100.0)
Platelets: 584 10*3/uL — ABNORMAL HIGH (ref 150–400)
RBC: 4.02 MIL/uL (ref 3.87–5.11)
RDW: 12.9 % (ref 11.5–15.5)
WBC: 13.6 10*3/uL — ABNORMAL HIGH (ref 4.0–10.5)
nRBC: 0 % (ref 0.0–0.2)

## 2019-03-03 NOTE — Plan of Care (Signed)

## 2019-03-03 NOTE — Progress Notes (Signed)
Occupational Therapy Treatment Patient Details Name: Jeanne Sims MRN: [id number] DOB: [birth year] Today's Date: 03/03/2019    History of present illness Pt is a 64 y.o. 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 Tested (+) C-diff. Likely extension of previous CVA; head CT with no evidence of acute abnormality. PMH includes acquired deafness (s/p cochlear implant 1983), cervical CA, ovarian CA, DM, heart murmur.   OT comments  Pt dressed with set up, toileted and groomed at sink with supervision. Performed R UE strengthening exercises with 1/2-1lb weights. Pt fatigues easily. Feels more steady with walker and feels it helps her with energy conservation. Updated d/c to OPOT.  Follow Up Recommendations  Outpatient OT    Equipment Recommendations  None recommended by OT    Recommendations for Other Services      Precautions / Restrictions Precautions Precautions: Fall       Mobility Bed Mobility Overal bed mobility: Independent                Transfers Overall transfer level: Needs assistance Equipment used: None;Rolling walker (2 wheeled) Transfers: Sit to/from Stand Sit to Stand: Supervision         General transfer comment: for safety    Balance Overall balance assessment: Needs assistance   Sitting balance-Leahy Scale: Normal     Standing balance support: No upper extremity supported Standing balance-Leahy Scale: Fair                             ADL either performed or assessed with clinical judgement   ADL Overall ADL's : Needs assistance/impaired Eating/Feeding: Sitting;Independent Eating/Feeding Details (indicate cue type and reason): leading with R hand Grooming: Wash/dry hands;Standing;Supervision/safety           Upper Body Dressing : Set up;Sitting   Lower Body  Dressing: Set up;Sitting/lateral leans   Toilet Transfer: Min guard;Ambulation   Toileting- Clothing Manipulation and Hygiene: Supervision/safety;Sit to/from stand       Functional mobility during ADLs: Min guard;Rolling walker(or none) General ADL Comments: pt fatigues easily     Vision       Perception     Praxis      Cognition Arousal/Alertness: Awake/alert Behavior During Therapy: WFL for tasks assessed/performed Overall Cognitive Status: Within Functional Limits for tasks assessed                                          Exercises Exercises: General Upper Extremity General Exercises - Upper Extremity Shoulder Flexion: Strengthening;Right;10 reps;Seated;Bar weights/barbell Bar Weights/Barbell (Shoulder Flexion): (1/2 lb) Shoulder Horizontal ABduction: Strengthening;10 reps;Right;Seated;Bar weights/barbell Bar Weights/Barbell (Shoulder Horizontal Abduction): (1/2 lb) Shoulder Horizontal ADduction: Strengthening;Right;10 reps;Seated;Bar weights/barbell Bar Weights/Barbell (Shoulder Horizontal Adduction): (1/2 lb) Elbow Flexion: Strengthening;Right;10 reps;Seated;Bar weights/barbell Bar Weights/Barbell (Elbow Flexion): 1 lb Elbow Extension: Strengthening;Both;10 reps;Seated;Bar weights/barbell Bar Weights/Barbell (Elbow Extension): 1 lb Other Exercises Other Exercises: worked on Psychologist, educational Comments      Pertinent Vitals/ Pain       Pain Assessment: No/denies pain  Home Living  Prior Functioning/Environment              Frequency  Min 2X/week        Progress Toward Goals  OT Goals(current goals can now be found in the care plan section)  Progress towards OT goals: Progressing toward goals  Acute Rehab OT Goals Patient Stated Goal: Return home, regain energy OT Goal Formulation: With patient Time For Goal Achievement:  03/14/19 Potential to Achieve Goals: Good  Plan Discharge plan remains appropriate    Co-evaluation                 AM-PAC OT "6 Clicks" Daily Activity     Outcome Measure   Help from another person eating meals?: None Help from another person taking care of personal grooming?: A Little Help from another person toileting, which includes using toliet, bedpan, or urinal?: A Little Help from another person bathing (including washing, rinsing, drying)?: A Little Help from another person to put on and taking off regular upper body clothing?: None Help from another person to put on and taking off regular lower body clothing?: None 6 Click Score: 21    End of Session Equipment Utilized During Treatment: Rolling walker;Gait belt  OT Visit Diagnosis: Muscle weakness (generalized) (M62.81)   Activity Tolerance Patient tolerated treatment well   Patient Left in chair;with call bell/phone within reach   Nurse Communication          Time: 9093-1121 OT Time Calculation (min): 36 min  Charges: OT General Charges $OT Visit: 1 Visit OT Treatments $Self Care/Home Management : 8-22 mins $Neuromuscular Re-education: 8-22 mins  Nestor Lewandowsky, OTR/L Acute Rehabilitation Services Pager: [phone] Office: [phone]   Malka So 03/03/2019, 10:32 AM

## 2019-03-03 NOTE — Progress Notes (Signed)
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 There is a history ofchronic leukocytosis and thrombocytosis with last Heme/Onc visit 11/17/18 that documents negative work up,hypothyroidism, diabetes with last Ha1c on 02/22/19 of 7.2%,bilateral PE2/14/21now on eliquis.  Patient withstroke2/16/21with right-sided weakness, negative CT head x 2, negative CTA. Shepresents now with complaints of increased right-sided weakness, headache, vomiting, and diarrhea. Also having increased right sided weakness of leg with difficulty walking. Patient had epidoses of diarrhea in the ED. Stool tested positive for C. Diff and was heme positive. Neurology was consulted  given the patient's recent history of CVA with right LE weakness and being on loading dose of Eliquis thru 03/02/19 no acute intervention recommend.   New events last 24 hours / Subjective: Continues to have gas, abdominal cramping.  Had 1 bowel movement this morning so far.  Tolerating p.o.  Hesitant on discharging home yet with her symptoms  Assessment & Plan:   Principal Problem:   C. difficile diarrhea Active Problems:   Controlled type 2 diabetes mellitus with hyperglycemia ([place])   Hypothyroidism   COVID-19 virus infection   Cerebrovascular accident (CVA) ([place])   CVA (cerebrovascular accident) ([place])   C. Diff Colitis  -Patient did take a round of Augmentin for sinusitis 01/28/19 followed by a dose of diflucan -Leukocytosis improving -Continue p.o. vancomycin  Right side weakness -Patient with probable extension of previous CVA with RLE weakness and paresthesia. At last admission 2/14-2/18, she had  CTA head/neck negative, Echo with MV regurg, AV regurg, normal LVEF. She had A1C 7.2 % and LDL was well controlled -Previous hospitalist spoke with neuro, nothing to add at this time; unable to complete MRI due to cochlear implant -Continue Eliquis, Crestor  Covid 19 positive  -Patient was covid positive 1/23 and 2/14 per patient report. She never received Remdesivir or steroids as she was asymptomatic -Remains asymptomatic, outside infective window, off precautions at this point -Remains asymptomatic on room air  Hypothyroidism  -Continue levothyroxine   DM2 -Hold trulicity and metformin while inpatient -Continue SSI   DVT prophylaxis: Eliquis Code Status: Full code Family Communication: None at bedside, discussed with daughter over the phone Disposition Plan:  . Patient is from home prior to admission. . Currently in-hospital treatment needed due to ongoing abdominal cramping, continue treatment for C. difficile colitis. . Suspect patient will discharge home hopefully 2/26 with improvement in symptoms.   Consultants:   None  Procedures:   None  Antimicrobials:  Anti-infectives (From admission, onward)   Start     Dose/Rate Route Frequency Ordered Stop   02/27/19 1400  vancomycin (VANCOCIN) 50 mg/mL oral solution 125 mg     125 mg Oral 4 times daily 02/27/19 1346 03/09/19 1359       Objective: Vitals:   03/02/19 0728 03/02/19 1602 03/02/19 2046 03/03/19 0752  BP: 116/71 (!) 111/55 (!) 122/51 (!) 112/42  Pulse: 80 79 87 80  Resp:   16 19  Temp: 98.6 F (37 C) 97.7 F (36.5 C) 100 F (37.8 C) 98 F (36.7 C)  TempSrc: Oral Oral Oral   SpO2: 95% 99% 98% 98%  Weight:      Height:       No intake  or output data in the 24 hours ending 03/03/19 1052 Filed Weights   02/27/19 0945  Weight: 86.4 kg    Examination: General exam: Appears calm and comfortable  Respiratory system: Clear to auscultation. Respiratory effort normal.  On room air without  distress. Cardiovascular system: S1 & S2 heard, RRR. No pedal edema. Gastrointestinal system: Abdomen is nondistended, soft and nontender. Normal bowel sounds heard. Central nervous system: Alert and oriented. Non focal exam. Speech clear  Extremities: Symmetric in appearance bilaterally  Skin: No rashes, lesions or ulcers on exposed skin  Psychiatry: Judgement and insight appear stable. Mood & affect appropriate.    Data Reviewed: I have personally reviewed following labs and imaging studies  CBC: Recent Labs  Lab 02/27/19 1021 02/28/19 1033 03/01/19 0250 03/02/19 1500 03/03/19 0901  WBC 29.4* 21.0* 16.6* 14.1* 13.6*  NEUTROABS  --  14.3* 9.8*  --   --   HGB 13.0 11.7* 12.0 12.0 12.4  HCT 39.3 36.0 35.8* 35.6* 37.3  MCV 93.8 94.5 92.0 92.7 92.8  PLT 515* 519* 520* 567* 948*   Basic Metabolic Panel: Recent Labs  Lab 02/27/19 1021 02/28/19 1033 03/01/19 0250  NA 137 139 141  K 4.2 3.8 3.5  CL 103 104 106  CO2 24 24 25   GLUCOSE 154* 149* 128*  BUN 9 6* 5*  CREATININE 0.78 0.66 0.63  CALCIUM 8.8* 8.3* 8.3*  MG  --  1.8 2.0   GFR: Estimated Creatinine Clearance: 79.7 mL/min (by C-G formula based on SCr of 0.63 mg/dL). Liver Function Tests: Recent Labs  Lab 02/27/19 1021 02/28/19 1033 03/01/19 0250  AST 15 17 14*  ALT 17 15 13   ALKPHOS 67 68 67  BILITOT 0.3 0.5 0.5  PROT 6.4* 6.2* 6.3*  ALBUMIN 3.1* 2.9* 2.8*   No results for input(s): LIPASE, AMYLASE in the last 168 hours. No results for input(s): AMMONIA in the last 168 hours. Coagulation Profile: No results for input(s): INR, PROTIME in the last 168 hours. Cardiac Enzymes: No results for input(s): CKTOTAL, CKMB, CKMBINDEX, TROPONINI in the last 168 hours. BNP (last 3 results) No results for input(s): PROBNP in the last 8760 hours. HbA1C: No results for input(s): HGBA1C in the last 72 hours. CBG: Recent Labs  Lab 03/02/19 0722 03/02/19 1211 03/02/19 1558 03/02/19 2044 03/03/19 0726  GLUCAP 115*  114* 100* 115* 127*   Lipid Profile: No results for input(s): CHOL, HDL, LDLCALC, TRIG, CHOLHDL, LDLDIRECT in the last 72 hours. Thyroid Function Tests: No results for input(s): TSH, T4TOTAL, FREET4, T3FREE, THYROIDAB in the last 72 hours. Anemia Panel: No results for input(s): VITAMINB12, FOLATE, FERRITIN, TIBC, IRON, RETICCTPCT in the last 72 hours. Sepsis Labs: Recent Labs  Lab 02/27/19 1115  LATICACIDVEN 0.9    Recent Results (from the past 240 hour(s))  C difficile quick scan w PCR reflex     Status: Abnormal   Collection Time: 02/27/19 11:15 AM   Specimen: Per Rectum; Stool  Result Value Ref Range Status   C Diff antigen POSITIVE (A) NEGATIVE Final    Comment: CRITICAL RESULT CALLED TO, READ BACK BY AND VERIFIED WITH: RN Osama Coleson PAYAN @1248  02/26/18 [facility]    C Diff toxin POSITIVE (A) NEGATIVE Final    Comment: CRITICAL RESULT CALLED TO, READ BACK BY AND VERIFIED WITH: RN Barbette Mcglaun PAYAN @1248  02/26/18 [facility]    C Diff interpretation Toxin producing C. difficile detected.  Final    Comment: CRITICAL RESULT CALLED TO, READ BACK BY AND VERIFIED WITH: Zeffie Bickert PAYAN @  1248 02/26/18 [facility] Performed at [facility] [address] [place], [place] [address]   Blood culture (routine x 2)     Status: None (Preliminary result)   Collection Time: 02/27/19 11:16 AM   Specimen: BLOOD LEFT ARM  Result Value Ref Range Status   Specimen Description BLOOD LEFT ARM  Final   Special Requests   Final    BOTTLES DRAWN AEROBIC AND ANAEROBIC Blood Culture results may not be optimal due to an inadequate volume of blood received in culture bottles   Culture   Final    NO GROWTH 3 DAYS Performed at [facility], [place] [address], [place], [place] [address]    Report Status PENDING  Incomplete  Blood culture (routine x 2)     Status: None (Preliminary result)   Collection Time: 02/27/19 11:16 AM   Specimen: BLOOD RIGHT ARM  Result Value Ref Range Status   Specimen Description BLOOD RIGHT  ARM  Final   Special Requests   Final    BOTTLES DRAWN AEROBIC AND ANAEROBIC Blood Culture adequate volume   Culture   Final    NO GROWTH 3 DAYS Performed at [facility], [place] [address] [place], [place] [address]    Report Status PENDING  Incomplete      Radiology Studies: No results found.    Scheduled Meds: . apixaban  5 mg Oral BID  . ferrous sulfate  325 mg Oral Q breakfast  . folic acid  1 mg Oral Daily  . hydrocortisone cream  1 application Topical BID  . insulin aspart  0-20 Units Subcutaneous TID WC  . insulin aspart  0-5 Units Subcutaneous QHS  . levothyroxine  175 mcg Oral QAC breakfast  . metoprolol succinate  12.5 mg Oral Daily  . pantoprazole  40 mg Oral Daily  . rosuvastatin  20 mg Oral Daily  . vancomycin  125 mg Oral QID   Continuous Infusions:   LOS: 4 days      Time spent: 30 minutes   Dessa Phi, DO [facility] 03/03/2019, 10:52 AM   Available via Epic secure chat 7am-7pm After these hours, please refer to coverage provider listed on [email or web address]

## 2019-03-04 LAB — GLUCOSE, CAPILLARY: Glucose-Capillary: 105 mg/dL — ABNORMAL HIGH (ref 70–99)

## 2019-03-04 MED ORDER — VANCOMYCIN HCL 125 MG PO CAPS: 125.0000 mg | ORAL_CAPSULE | Freq: Four times a day (QID) | ORAL | 0 refills | Status: AC

## 2019-03-04 NOTE — Discharge Instructions (Signed)
https://www.cdc.gov/cdiff/what-is.html">  Clostridioides Difficile Infection Clostridioides difficile infection, also known as C. difficile or C. diff infection, happens when too much C. diff bacteria grows and causes inflammation of the colon (colitis). It is linked to recent use of antibiotic medicine. This infection can be passed from person to person (is contagious). You may also be exposed to the bacteria in the environment, such as from contact with food, water, or surfaces that have the bacteria on them. What are the causes? Certain bacteria live in the colon and help to digest food. This infection develops when the balance of helpful bacteria in the colon changes and the C. diff bacteria grow out of control. This is caused by taking antibiotics. What increases the risk? The following factors may make you more likely to develop this condition:  Taking certain antibiotics that kill many types of bacteria or taking antibiotics for a long time.  Having an extended stay in health care settings, such as hospitals and long-term care facilities.  Being older than age 65.  Having had a C. diff infection before or a known exposure to C. diff bacteria.  Having a weak disease-fighting system (immune system).  Taking a medicine to reduce stomach acid, such as a proton pump inhibitor, for a long time.  Having a serious underlying condition, such as colon cancer or inflammatory bowel disease (IBD).  Having had a gastrointestinal (GI) tract procedure or surgery. Some people develop this infection even though they do not have any known risk factors. What are the signs or symptoms? Symptoms of this condition include:  Diarrhea (three or more times a day) for several days.  Fever.  Tiredness (fatigue).  Loss of appetite.  Nausea.  Swelling, pain, cramping, or tenderness in the abdomen. How is this diagnosed? This condition is diagnosed with:  Your medical history and a physical  exam.  Tests, which may include: ? A test for C. diff in your stool (feces). ? Blood tests. ? Imaging tests, such as a CT scan of your abdomen.  A procedure in which your health care provider can look inside your colon. This is rare. How is this treated? Treatment for this condition may include:  Stopping the antibiotics that you were taking when the C. diff infection began. Do this only as told by your health care provider.  Taking certain antibiotics to stop C. diff growth.  Taking donor stool from a healthy person and placing it into the colon (fecal transplant). This may be done if the infection keeps coming back.  Having surgery to remove the infected part of the colon. This is rare. Follow these instructions at home: Medicines  Take over-the-counter and prescription medicines only as told by your health care provider.  Take your antibiotic medicine as told by your health care provider. Do not stop taking the antibiotic even if you start to feel better.  Do not treat diarrhea with medicines unless your health care provider tells you to. Eating and drinking   Follow instructions from your health care provider about eating or drinking restrictions.  Eat foods that are bland and easy to digest in small amounts as you can. These foods include bananas, applesauce, rice, lean meats, toast, and crackers.  Follow instructions on how to replace body fluid that has been lost (rehydrate). This may include: ? Drinking clear fluids, such as water, clear fruit juice, and low-calorie sports drinks. ? Sucking on ice chips. ? Taking an oral rehydration solution (ORS). This drink is sold at pharmacies   and retail stores.  Avoid milk, caffeine, and alcohol.  Drink enough fluid to keep your urine pale yellow. Activity  Rest as told by your health care provider.  Return to your normal activities as told by your health care provider. Ask your health care provider what activities are safe  for you. General instructions  Wash your hands often with soap and water for at least 20 seconds. Bathe using soap and water daily.  Be sure your home is clean before you leave the hospital or clinic to go home. Continue daily cleaning for at least a week after going home.  Keep all follow-up visits as told by your health care provider. This is important. How is this prevented? Hand hygiene   Wash your hands thoroughly with soap and water for at least 20 seconds before preparing food and after using the bathroom. Make sure the people you live with also wash their hands often with soap and water for at least 20 seconds.  If you are being treated at a hospital or clinic, make sure that all health care providers and visitors wash their hands with soap and water before touching you. Contact precautions  If you develop diarrhea while in the hospital or a long-term care facility, tell your health care team right away.  When visiting someone in the hospital or a long-term care facility, follow guidelines for wearing a gown, gloves, or other protective equipment.  If possible, avoid contact with people who have diarrhea.  If you are sick and live with other people, use a separate bathroom, if possible. Clean environment  Clean surfaces that are touched often every day. C. diff bacteria are very resistant to cleaning and can live for months on surfaces. The bacteria are killed only by cleaning products containing 10%-solution chlorine bleach. Be sure to: ? Read the manufacturer's instructions or read online resources to find out if the product you are using will work for the surface you are cleaning. ? Clean frequently touched surfaces, such as toilets (remember the flush handle), bathtubs, sinks, door knobs, and work surfaces.  If you are in the hospital, make sure that staff members clean the surfaces in your room daily. Let a staff person know right away if body fluids have splashed or  spilled. Washing clothes and linens  Use a laundry detergent containing chlorine bleach. Be sure to use powder detergent instead of liquid. Only some liquid detergents have bleaching agents that contain chlorine bleach. Powder detergents contain chlorine bleach in low levels to help kill any bacteria.  Run your washing machine on the hot setting once a month without clothes or linens but with enough detergent for washing at full capacity. This will kill any remaining C. diff bacteria. Contact a health care provider if:  Your symptoms do not get better, or they get worse, even with treatment.  Your symptoms go away and then come back.  You have a fever.  You develop new symptoms. Get help right away if:  You have more pain or tenderness in your abdomen.  You have stool that is mostly bloody, or your stool looks dark black and tarry.  You cannot eat or drink without vomiting.  You have signs or symptoms of dehydration, such as: ? Dark urine, very little urine, or no urine. ? Cracked lips or dry mouth. ? Not making tears when you cry. ? Sunken eyes. ? Sleepiness. ? Weakness or dizziness. Summary  Clostridioides difficile, or C. diff, infection happens when too much   C. diff bacteria grows and causes inflammation of the colon (colitis) after antibiotic use.  Symptoms of this infection include diarrhea, fever, tiredness (fatigue), loss of appetite, nausea, and symptoms affecting the abdomen.  This infection may be treated by stopping the antibiotics you were using when the infection began, and then taking certain antibiotics to stop C. diff from growing. Sometimes, fecal transplant or surgery is done.  Handwashing, contact precautions, a clean environment, and washing clothes and linens can help prevent or limit spread of this infection. This information is not intended to replace advice given to you by your health care provider. Make sure you discuss any questions you have with your  health care provider. Document Revised: 05/19/2018 Document Reviewed: 05/19/2018 [employer] Patient Education  2020 [employer]  

## 2019-03-04 NOTE — Discharge Summary (Signed)
Physician Discharge Summary  Jeanne Sims WGN:[id number] DOB: [birth year] DOA: 02/27/2019  PCP: Bonnita Nasuti, MD  Admit date: 02/27/2019 Discharge date: 03/04/2019  Admitted From: Home Disposition:  Home with outpatient PT   Recommendations for Outpatient Follow-up:  1. Follow up with PCP in 1 week 2. Obtain repeat CBC in 1 week  3. Follow up with Neurology in 4 weeks. Referral placed at time of discharge   Discharge Condition: Stable CODE STATUS: Full  Diet recommendation: Heart healthy   Brief/Interim Summary: Jeanne Sims is a 64 y.o.femalewith medical history significant ofrecent covid infectionwith positive test 01/29/19 and2/14/21 with no record of treatment with remedesivir or steroids, history of ovarian/cervical cancerat age 6/ lymphoma1987. There is a history ofchronic leukocytosis and thrombocytosis with last Heme/Onc visit 11/17/18 that documents negative work up,hypothyroidism, diabetes with last Ha1c on 02/22/19 of 7.2%,bilateral PE2/14/21now on eliquis.  Patient withstroke2/16/21with right-sided weakness, negative CT head x 2, negative CTA. Shepresents now with complaints of increased right-sided weakness, headache, vomiting, and diarrhea. Also having increased right sided weakness of leg with difficulty walking. Patient had epidoses of diarrhea in the ED. Stool tested positive for C. Diff and was heme positive. Neurology was consulted  given the patient's recent history of CVA with right LE weakness and being on loading dose of Eliquis thru 03/02/19 no acute intervention recommend. She was treated for C Diff with PO vanco with slow improvement in her symptoms.   Discharge Diagnoses:  Principal Problem:   C. difficile diarrhea Active Problems:   Controlled type 2 diabetes mellitus with hyperglycemia ([place])   Hypothyroidism   COVID-19 virus infection   Cerebrovascular accident (CVA) ([place])   CVA (cerebrovascular accident) ([place])   C. Diff Colitis   -Patient did take a round of Augmentin for sinusitis 01/28/19 followed by a dose of diflucan -Leukocytosis improving -Continue p.o. vancomycin through 03/09/19 for total 10 day course   Right side weakness -Patient with probable extension of previous CVA with RLE weakness and paresthesia. At last admission 2/14-2/18, she had CTA head/neck negative, Echo with MV regurg, AV regurg, normal LVEF. She had A1C 7.2 % and LDL was well controlled -Previous hospitalist spoke with neuro, nothing to add at this time; unable to complete MRI due to cochlear implant -Continue Eliquis, Crestor  Covid 19 positive  -Patient was covid positive 1/23 and 2/14 per patient report. She never received Remdesivir or steroids as she was asymptomatic -Remains asymptomatic, outside infective window, off precautions at this point -Remains asymptomatic on room air  Hypothyroidism  -Continue levothyroxine   DM2 -Hold trulicity and metformin while inpatient -Continue SSI    Discharge Instructions  Discharge Instructions    Ambulatory referral to Neurology   Complete by: As directed    Follow up with stroke clinic NP (Jessica Vanschaick or Cecille Rubin, if both not available, consider Zachery Dauer, or [place]) at [facility] in about 4 weeks. Thanks.   Ambulatory referral to Physical Therapy   Complete by: As directed    Call MD for:  difficulty breathing, headache or visual disturbances   Complete by: As directed    Call MD for:  extreme fatigue   Complete by: As directed    Call MD for:  persistant dizziness or light-headedness   Complete by: As directed    Call MD for:  persistant nausea and vomiting   Complete by: As directed    Call MD for:  severe uncontrolled pain   Complete by: As directed    Call  MD for:  temperature >100.4   Complete by: As directed    Diet - low sodium heart healthy   Complete by: As directed    Discharge instructions   Complete by: As directed    You were cared for by a  hospitalist during your hospital stay. If you have any questions about your discharge medications or the care you received while you were in the hospital after you are discharged, you can call the unit and ask to speak with the hospitalist on call if the hospitalist that took care of you is not available. Once you are discharged, your primary care physician will handle any further medical issues. Please note that NO REFILLS for any discharge medications will be authorized once you are discharged, as it is imperative that you return to your primary care physician (or establish a relationship with a primary care physician if you do not have one) for your aftercare needs so that they can reassess your need for medications and monitor your lab values.   Increase activity slowly   Complete by: As directed      Allergies as of 03/04/2019   No Known Allergies     Medication List    STOP taking these medications   Linzess 290 MCG Caps capsule Generic drug: linaclotide   polyethylene glycol 17 g packet Commonly known as: MIRALAX / GLYCOLAX     TAKE these medications   apixaban 5 MG Tabs tablet Commonly known as: ELIQUIS Take 2 tablets (10 mg total) by mouth 2 (two) times daily for 5 days. LAST DOSE ON 03/01/19-BEFORE SWITCHING TO 5 MG TWICE DAILY ON 2/24 What changed:   additional instructions  Another medication with the same name was removed. Continue taking this medication, and follow the directions you see here.   Cinnamon 500 MG capsule Take 2,000 mg by mouth 2 (two) times daily.   Cyanocobalamin 1000 MCG/ML Kit Inject as directed every 30 (thirty) days.   Dexilant 60 MG capsule Generic drug: dexlansoprazole Take 60 mg by mouth daily.   Euthyrox 175 MCG tablet Generic drug: levothyroxine Take 175 mcg by mouth daily before breakfast.   ferrous sulfate 325 (65 FE) MG tablet Take 1 tablet (325 mg total) by mouth daily with breakfast. Please take with a source of vitamin C (orange  juice or tablet with 500 units of Vitamin C).   folic acid 453 MCG tablet Commonly known as: FOLVITE Take 800 mcg by mouth daily.   metFORMIN 500 MG tablet Commonly known as: GLUCOPHAGE Take 500 mg by mouth 2 (two) times daily with a meal.   metoprolol succinate 25 MG 24 hr tablet Commonly known as: TOPROL-XL Take 12.5 mg by mouth daily.   rosuvastatin 20 MG tablet Commonly known as: CRESTOR Take 1 tablet (20 mg total) by mouth daily.   Trulicity 1.5 MI/6.8EH Sopn Generic drug: Dulaglutide Inject 1.5 mg into the skin once a week.   vancomycin 125 MG capsule Commonly known as: VANCOCIN Take 1 capsule (125 mg total) by mouth 4 (four) times daily for 5 days. Continue antibiotics through 03/09/19   Vitamin D (Ergocalciferol) 1.25 MG (50000 UNIT) Caps capsule Commonly known as: DRISDOL Take 50,000 Units by mouth every 7 (seven) days.      Follow-up Information    [facility]. Schedule an appointment as soon as possible for a visit in 4 week(s).   Specialty: Neurology Contact information: [address] [place] [phone]  [place], Rosalyn Charters, MD. Schedule an appointment as soon as possible for a visit in 1 week(s).   Specialty: Internal Medicine Contact information: [place] [address] [phone]          No Known Allergies     Procedures/Studies: CT ANGIO HEAD W OR WO CONTRAST  Result Date: 02/22/2019 CLINICAL DATA:  Right lower extremity sudden weakness, on heparin drip; stroke/TIA, assess intracranial arteries. EXAM: CT ANGIOGRAPHY HEAD AND NECK TECHNIQUE: Multidetector CT imaging of the head and neck was performed using the standard protocol during bolus administration of intravenous contrast. Multiplanar CT image reconstructions and MIPs were obtained to evaluate the vascular anatomy. Carotid stenosis measurements (when applicable) are obtained utilizing NASCET criteria, using  the distal internal carotid diameter as the denominator. CONTRAST:  58m OMNIPAQUE IOHEXOL 350 MG/ML SOLN COMPARISON:  Report from head CT 12/07/2013 (images unavailable) FINDINGS: CT HEAD FINDINGS Brain: Streak artifact from a left-sided cochlear implant obscures portions of the left cerebral hemisphere, limiting evaluation. There is no evidence of acute intracranial hemorrhage or demarcated cortical infarction. No evidence of intracranial mass. No midline shift or extra-axial fluid collection. Mild generalized parenchymal atrophy Vascular: Reported separately. Skull: Normal. Negative for fracture or focal lesion. Sinuses: Minimal mucosal thickening within the left sphenoid sinus. Orbits: Visualized orbits demonstrate no acute abnormality. Other: Sequela of previous left mastoidectomy. No significant mastoid effusion. Review of the MIP images confirms the above findings CTA NECK FINDINGS Aortic arch: Standard branching. Scattered calcified plaque within the visualized aortic arch. No significant innominate or proximal subclavian stenosis. Right carotid system: CCA and ICA patent within the neck without measurable stenosis. Mild mixed plaque within the proximal ICA. Left carotid system: CCA and ICA patent within the neck without measurable stenosis. Mild mixed plaque within the proximal ICA. Vertebral arteries: Left vertebral artery dominant. The vertebral arteries are patent within the neck without stenosis. Skeleton: No acute bony abnormality. Other neck: No neck mass or cervical lymphadenopathy. The thyroid gland is atrophic or surgically absent. Upper chest: Linear atelectasis and/or scarring within the left lung apex. Partially visualized small left pleural effusion. Review of the MIP images confirms the above findings CTA HEAD FINDINGS Anterior circulation: The intracranial internal carotid arteries are patent with scattered calcified plaque but no significant stenosis. The M1 middle cerebral arteries are  patent bilaterally without significant stenosis. No M2 proximal branch occlusion or high-grade proximal stenosis is identified. The anterior cerebral arteries are patent bilaterally without high-grade proximal stenosis. The A1 left ACA is slightly hypoplastic. No intracranial aneurysm is identified. Posterior circulation: The non dominant intracranial right vertebral artery is developmentally diminutive beyond the origin of the right PICA, although patent. The dominant intracranial left vertebral artery is patent without significant stenosis, as is the basilar artery. The posterior cerebral arteries are patent bilaterally without significant proximal stenosis. Posterior communicating arteries are poorly delineated and may be hypoplastic or absent bilaterally. Venous sinuses: Within limitations of contrast timing, no convincing thrombus. Anatomic variants: As described Review of the MIP images confirms the above findings IMPRESSION: CT head: 1. 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 2. Within this limitation, no evidence of acute intracranial abnormality. 3. Mild generalized parenchymal atrophy. CTA neck: The bilateral common carotid, internal carotid and vertebral arteries are patent within the neck without measurable stenosis. Mild atherosclerotic plaque within the visualized aortic arch and carotid systems. CTA head: 1. No intracranial large vessel occlusion or proximal high-grade arterial stenosis.  2. Mild calcified plaque within the intracranial internal carotid arteries without stenosis. Electronically Signed   By: Kellie Simmering DO   On: 02/22/2019 17:59   CT Head Wo Contrast  Result Date: 02/27/2019 CLINICAL DATA:  Dizziness, headache EXAM: CT HEAD WITHOUT CONTRAST TECHNIQUE: Contiguous axial images were obtained from the base of the skull through the vertex without intravenous contrast. COMPARISON:  02/24/2019 FINDINGS:  Brain: No evidence of acute infarction, hemorrhage, hydrocephalus, extra-axial collection or mass lesion/mass effect. Streak artifact from the patient's cochlear implant obscures evaluation, particularly involving the left occipital lobe. Mild subcortical white matter and periventricular small vessel ischemic changes. Vascular: Mild intracranial atherosclerosis. Skull: Normal. Negative for fracture or focal lesion. Sinuses/Orbits: The visualized paranasal sinuses are essentially clear. Left mastoidectomy. Other: None. IMPRESSION: No evidence of acute intracranial abnormality. Stable left cochlear implant with associated streak artifact. Electronically Signed   By: Julian Hy M.D.   On: 02/27/2019 12:32   CT HEAD WO CONTRAST  Result Date: 02/24/2019 CLINICAL DATA:  Stroke follow-up. Right-sided weakness and decreased sensation. EXAM: CT HEAD WITHOUT CONTRAST TECHNIQUE: Contiguous axial images were obtained from the base of the skull through the vertex without intravenous contrast. COMPARISON:  Two days ago FINDINGS: Brain: There is indistinct anterior right insula, likely artifact from the patient's cochlear implant as this does not correlate with the reported symptoms. No identified infarct, hemorrhage, hydrocephalus, or masslike finding. Vascular: No hyperdense vessel. Skull: Negative Sinuses/Orbits: Unremarkable left cochlear implant. IMPRESSION: Stable head CT.  No visible infarct. Electronically Signed   By: Monte Fantasia M.D.   On: 02/24/2019 09:42   CT ANGIO NECK W OR WO CONTRAST  Result Date: 02/22/2019 CLINICAL DATA:  Right lower extremity sudden weakness, on heparin drip; stroke/TIA, assess intracranial arteries. EXAM: CT ANGIOGRAPHY HEAD AND NECK TECHNIQUE: Multidetector CT imaging of the head and neck was performed using the standard protocol during bolus administration of intravenous contrast. Multiplanar CT image reconstructions and MIPs were obtained to evaluate the vascular anatomy.  Carotid stenosis measurements (when applicable) are obtained utilizing NASCET criteria, using the distal internal carotid diameter as the denominator. CONTRAST:  67m OMNIPAQUE IOHEXOL 350 MG/ML SOLN COMPARISON:  Report from head CT 12/07/2013 (images unavailable) FINDINGS: CT HEAD FINDINGS Brain: Streak artifact from a left-sided cochlear implant obscures portions of the left cerebral hemisphere, limiting evaluation. There is no evidence of acute intracranial hemorrhage or demarcated cortical infarction. No evidence of intracranial mass. No midline shift or extra-axial fluid collection. Mild generalized parenchymal atrophy Vascular: Reported separately. Skull: Normal. Negative for fracture or focal lesion. Sinuses: Minimal mucosal thickening within the left sphenoid sinus. Orbits: Visualized orbits demonstrate no acute abnormality. Other: Sequela of previous left mastoidectomy. No significant mastoid effusion. Review of the MIP images confirms the above findings CTA NECK FINDINGS Aortic arch: Standard branching. Scattered calcified plaque within the visualized aortic arch. No significant innominate or proximal subclavian stenosis. Right carotid system: CCA and ICA patent within the neck without measurable stenosis. Mild mixed plaque within the proximal ICA. Left carotid system: CCA and ICA patent within the neck without measurable stenosis. Mild mixed plaque within the proximal ICA. Vertebral arteries: Left vertebral artery dominant. The vertebral arteries are patent within the neck without stenosis. Skeleton: No acute bony abnormality. Other neck: No neck mass or cervical lymphadenopathy. The thyroid gland is atrophic or surgically absent. Upper chest: Linear atelectasis and/or scarring within the left lung apex. Partially visualized small left pleural effusion. Review of the MIP images confirms the above findings CTA  HEAD FINDINGS Anterior circulation: The intracranial internal carotid arteries are patent with  scattered calcified plaque but no significant stenosis. The M1 middle cerebral arteries are patent bilaterally without significant stenosis. No M2 proximal branch occlusion or high-grade proximal stenosis is identified. The anterior cerebral arteries are patent bilaterally without high-grade proximal stenosis. The A1 left ACA is slightly hypoplastic. No intracranial aneurysm is identified. Posterior circulation: The non dominant intracranial right vertebral artery is developmentally diminutive beyond the origin of the right PICA, although patent. The dominant intracranial left vertebral artery is patent without significant stenosis, as is the basilar artery. The posterior cerebral arteries are patent bilaterally without significant proximal stenosis. Posterior communicating arteries are poorly delineated and may be hypoplastic or absent bilaterally. Venous sinuses: Within limitations of contrast timing, no convincing thrombus. Anatomic variants: As described Review of the MIP images confirms the above findings IMPRESSION: CT head: 1. 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 2. Within this limitation, no evidence of acute intracranial abnormality. 3. Mild generalized parenchymal atrophy. CTA neck: The bilateral common carotid, internal carotid and vertebral arteries are patent within the neck without measurable stenosis. Mild atherosclerotic plaque within the visualized aortic arch and carotid systems. CTA head: 1. No intracranial large vessel occlusion or proximal high-grade arterial stenosis. 2. Mild calcified plaque within the intracranial internal carotid arteries without stenosis. Electronically Signed   By: Kellie Simmering DO   On: 02/22/2019 17:59   CT Angio Chest PE W and/or Wo Contrast  Result Date: 02/20/2019 CLINICAL DATA:  Pleuritic chest pain with shortness of breath and tachypnea. Current COVID infection. New right leg  pain. History of non-Hodgkin's lymphoma, cervical cancer, ovarian cancer. EXAM: CT ANGIOGRAPHY CHEST WITH CONTRAST TECHNIQUE: Multidetector CT imaging of the chest was performed using the standard protocol during bolus administration of intravenous contrast. Multiplanar CT image reconstructions and MIPs were obtained to evaluate the vascular anatomy. CONTRAST:  54m OMNIPAQUE IOHEXOL 350 MG/ML SOLN COMPARISON:  CT chest 11/10/2018. FINDINGS: Cardiovascular: Filling defect is identified in the right middle lobe medial and lateral segmental pulmonary arteries, in the anterior basal and medial basal right lower lobe segmental pulmonary arteries, and in a subsegmental pulmonary artery in the left lower lobe (image 207/6). No lobar are larger thrombus is identified, and as result, this case does not qualify for submassive pulmonary embolus, which necessitates lobar or larger thrombus based on our current protocols. Coronary, aortic arch, and branch vessel atherosclerotic vascular disease. Overall heart size within normal limits. 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 Although the patient has a current SARS-CoV-2 infection, we do not demonstrate the typical patchy ground-glass opacities of active COVID pneumonia. Trace left pleural effusion. Upper Abdomen: Stable 0.9 cm hypodense lesion in segment 4 of the liver on image 91/4, previously characterized as a cyst. 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 Review of the MIP images confirms the above findings. IMPRESSION: 1. Acute segmental pulmonary emboli in the right middle lobe and right lower. Acute subsegmental embolus in the left lower lobe. Lobe overall clot burden is small. No  lobar or larger thrombus is identified, accordingly this does not qualify as submassive pulmonary embolus based on current protocols. 2. Trace left pleural effusion. 3. Coronary, aortic arch, and branch  vessel atherosclerotic vascular disease. Aortic Atherosclerosis (ICD10-I70.0). 4. 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 No lung opacities to suggest active pneumonia related to the patient's current SARS-CoV-2 infection. 5. 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 6. Stable hypodense lesion in segment 4 of the liver, previously characterized as a cyst. Critical Value/emergent results were called by telephone at the time of interpretation on 02/20/2019 at 2:05 pm to provider Dr. Marda Stalker , who verbally acknowledged these results. Electronically Signed   By: Van Clines M.D.   On: 02/20/2019 14:28   US Venous Img Lower Unilateral Right  Result Date: 02/20/2019 CLINICAL DATA:  Right calf cramping. EXAM: RIGHT LOWER EXTREMITY VENOUS DOPPLER ULTRASOUND TECHNIQUE: Gray-scale sonography with graded compression, as well as color Doppler and duplex ultrasound were performed to evaluate the lower extremity deep venous systems from the level of the common femoral vein and including the common femoral, femoral, profunda femoral, popliteal and calf veins including the posterior tibial, peroneal and gastrocnemius veins when visible. The superficial great saphenous vein was also interrogated. Spectral Doppler was utilized to evaluate flow at rest and with distal augmentation maneuvers in the common femoral, femoral and popliteal veins. COMPARISON:  None. FINDINGS: Contralateral Common Femoral Vein: Respiratory phasicity is normal and symmetric with the symptomatic side. No evidence of thrombus. Normal compressibility. Common Femoral Vein: No  evidence of thrombus. Normal compressibility, respiratory phasicity and response to augmentation. Saphenofemoral Junction: No evidence of thrombus. Normal compressibility and flow on color Doppler imaging. Profunda Femoral Vein: No evidence of thrombus. Normal compressibility and flow on color Doppler imaging. Femoral Vein: No evidence of thrombus. Normal compressibility, respiratory phasicity and response to augmentation. Popliteal Vein: No evidence of thrombus. Normal compressibility, respiratory phasicity and response to augmentation. Calf Veins: No evidence of thrombus. Normal compressibility and flow on color Doppler imaging. Superficial Great Saphenous Vein: No evidence of thrombus. Normal compressibility. Venous Reflux:  None. Other Findings: No evidence of superficial thrombophlebitis or abnormal fluid collection. IMPRESSION: No evidence of right lower extremity deep venous thrombosis. Electronically Signed   By: Aletta Edouard M.D.   On: 02/20/2019 13:21   DG Chest Port 1 View  Result Date: 02/27/2019 CLINICAL DATA:  Dizziness and left-sided headache with nausea and diarrhea. COVID-19 positive. EXAM: PORTABLE CHEST 1 VIEW COMPARISON:  08/03/2017 FINDINGS: Lungs are adequately inflated without focal airspace consolidation or effusion. Cardiomediastinal silhouette is normal. Remaining bones and soft tissues are unremarkable. IMPRESSION: No active disease. Electronically Signed   By: Marin Olp M.D.   On: 02/27/2019 10:34   ECHOCARDIOGRAM LIMITED  Result Date: 02/21/2019    ECHOCARDIOGRAM LIMITED REPORT   Patient Name:   Jeanne Sims [facility] Date of Exam: 02/21/2019 Medical Rec #:  [id number]       Height:       66.0 in Accession #:    [id number]      Weight:       191.0 lb Date of Birth:  [birth year]        BSA:          1.96 m Patient Age:    89 years        BP:           114/49 mmHg Patient Gender: F               HR:  77 bpm. Exam Location:  Inpatient Procedure: Limited Echo, Cardiac Doppler  and Limited Color Doppler Indications:    I26.02 Pulmonary embolus  History:        Patient has no prior history of Echocardiogram examinations.                 Signs/Symptoms:Dyspnea and Murmur; Risk Factors:Diabetes. COVID                 Positive. Cancer. Thyroid Disease.  Sonographer:    Tiffany Dance Referring Phys: [place]  Sonographer Comments: No subcostal window. IMPRESSIONS  1. Left ventricular ejection fraction, by estimation, is 60 to 65%. The left ventricle has normal function. The left ventricle has no regional wall motion abnormalities. Left ventricular diastolic function could not be evaluated.  2. Right ventricular systolic function is normal. The right ventricular size is normal.  3. The mitral valve is degenerative. Trivial mitral valve regurgitation.  4. The aortic valve is tricuspid. Aortic valve regurgitation is mild. Mild aortic valve stenosis. Comparison(s): No prior Echocardiogram. FINDINGS  Left Ventricle: Left ventricular ejection fraction, by estimation, is 60 to 65%. The left ventricle has normal function. The left ventricle has no regional wall motion abnormalities. The left ventricular internal cavity size was normal in size. There is  no left ventricular hypertrophy. The left ventricular diastology could not be evaluated due to mitral annular calcification (moderate or greater). Right Ventricle: The right ventricular size is normal. No increase in right ventricular wall thickness. Right ventricular systolic function is normal. Left Atrium: Left atrial size was normal in size. Right Atrium: Right atrial size was normal in size. Pericardium: There is no evidence of pericardial effusion. Mitral Valve: The mitral valve is degenerative in appearance. Moderate mitral annular calcification. Trivial mitral valve regurgitation. Tricuspid Valve: The tricuspid valve is grossly normal. Tricuspid valve regurgitation is mild. Aortic Valve: The aortic valve is tricuspid. . There is  moderate thickening and moderate calcification of the aortic valve. Aortic valve regurgitation is mild. Aortic regurgitation PHT measures 298 msec. Mild aortic stenosis is present. There is moderate  thickening of the aortic valve. There is moderate calcification of the aortic valve. Aortic valve mean gradient measures 11.3 mmHg. Aortic valve peak gradient measures 21.2 mmHg. Aortic valve area, by VTI measures 1.10 cm. Pulmonic Valve: The pulmonic valve was grossly normal. Pulmonic valve regurgitation is mild. Aorta: The aortic root is normal in size and structure. Venous: The inferior vena cava was not well visualized. IAS/Shunts: No atrial level shunt detected by color flow Doppler.  LEFT VENTRICLE PLAX 2D LVIDd:         3.93 cm LVIDs:         2.90 cm LV PW:         0.90 cm LV IVS:        0.86 cm LVOT diam:     2.00 cm LV SV:         54.98 ml LV SV Index:   17.15 LVOT Area:     3.14 cm  RIGHT VENTRICLE RV Basal diam:  2.73 cm TAPSE (M-mode): 2.7 cm LEFT ATRIUM             Index       RIGHT ATRIUM           Index LA diam:        3.70 cm 1.89 cm/m  RA Area:     14.60 cm LA Vol (A2C):   61.4 ml 31.31 ml/m  RA Volume:   33.90 ml  17.29 ml/m LA Vol (A4C):   46.9 ml 23.92 ml/m LA Biplane Vol: 55.0 ml 28.05 ml/m  AORTIC VALVE AV Area (Vmax):    1.08 cm AV Area (Vmean):   1.08 cm AV Area (VTI):     1.10 cm AV Vmax:           230.00 cm/s AV Vmean:          158.333 cm/s AV VTI:            0.500 m AV Peak Grad:      21.2 mmHg AV Mean Grad:      11.3 mmHg LVOT Vmax:         79.10 cm/s LVOT Vmean:        54.200 cm/s LVOT VTI:          0.175 m LVOT/AV VTI ratio: 0.35 AI PHT:            298 msec  AORTA Ao Root diam: 3.20 cm Ao Asc diam:  2.70 cm MITRAL VALVE                TRICUSPID VALVE MV Area (PHT): 3.72 cm     TR Peak grad:   29.4 mmHg MV Decel Time: 204 msec     TR Vmax:        271.00 cm/s MV E velocity: 115.00 cm/s MV A velocity: 122.00 cm/s  SHUNTS MV E/A ratio:  0.94         Systemic VTI:  0.18 m                              Systemic Diam: 2.00 cm Eleonore Chiquito MD Electronically signed by Eleonore Chiquito MD Signature Date/Time: 02/21/2019/1:19:24 PM    Final        Discharge Exam: Vitals:   03/03/19 2120 03/04/19 0806  BP: (!) 103/43 (!) 108/53  Pulse: 72 79  Resp: 18 18  Temp: 98.2 F (36.8 C) 98.1 F (36.7 C)  SpO2: 99% 97%    General: Pt is alert, awake, not in acute distress Cardiovascular: RRR, S1/S2 +, no edema Respiratory: CTA bilaterally, no wheezing, no rhonchi, no respiratory distress, no conversational dyspnea  Abdominal: Soft, NT, ND, bowel sounds + Extremities: no edema, no cyanosis Psych: Normal mood and affect, stable judgement and insight     The results of significant diagnostics from this hospitalization (including imaging, microbiology, ancillary and laboratory) are listed below for reference.     Microbiology: Recent Results (from the past 240 hour(s))  C difficile quick scan w PCR reflex     Status: Abnormal   Collection Time: 02/27/19 11:15 AM   Specimen: Per Rectum; Stool  Result Value Ref Range Status   C Diff antigen POSITIVE (A) NEGATIVE Final    Comment: CRITICAL RESULT CALLED TO, READ BACK BY AND VERIFIED WITH: RN Anaeli Cornwall PAYAN _0  02/26/18 [facility]    C Diff toxin POSITIVE (A) NEGATIVE Final    Comment: CRITICAL RESULT CALLED TO, READ BACK BY AND VERIFIED WITH: RN Cyleigh Massaro PAYAN _1  02/26/18 [facility]    C Diff interpretation Toxin producing C. difficile detected.  Final    Comment: CRITICAL RESULT CALLED TO, READ BACK BY AND VERIFIED WITH: Etherine Mackowiak PAYAN _2  02/26/18 [facility] Performed at [facility] [address], [place], [place] [address]   Blood culture (routine x 2)     Status: None (Preliminary  result)   Collection Time: 02/27/19 11:16 AM   Specimen: BLOOD LEFT ARM  Result Value Ref Range Status   Specimen Description BLOOD LEFT ARM  Final   Special Requests   Final    BOTTLES DRAWN AEROBIC AND ANAEROBIC Blood Culture results may not be  optimal due to an inadequate volume of blood received in culture bottles Performed at [facility] [address], [place], [place] [address]    Culture NO GROWTH 4 DAYS  Final   Report Status PENDING  Incomplete  Blood culture (routine x 2)     Status: None (Preliminary result)   Collection Time: 02/27/19 11:16 AM   Specimen: BLOOD RIGHT ARM  Result Value Ref Range Status   Specimen Description BLOOD RIGHT ARM  Final   Special Requests   Final    BOTTLES DRAWN AEROBIC AND ANAEROBIC Blood Culture adequate volume Performed at [facility], [place] [address] [place], [place] [address]    Culture NO GROWTH 4 DAYS  Final   Report Status PENDING  Incomplete     Labs: BNP (last 3 results) No results for input(s): BNP in the last 8760 hours. Basic Metabolic Panel: Recent Labs  Lab 02/27/19 1021 02/28/19 1033 03/01/19 0250  NA 137 139 141  K 4.2 3.8 3.5  CL 103 104 106  CO2 _0 GLUCOSE 154* 149* 128*  BUN 9 6* 5*  CREATININE 0.78 0.66 0.63  CALCIUM 8.8* 8.3* 8.3*  MG  --  1.8 2.0   Liver Function Tests: Recent Labs  Lab 02/27/19 1021 02/28/19 1033 03/01/19 0250  AST 15 17 14*  ALT _1 ALKPHOS 67 68 67  BILITOT 0.3 0.5 0.5  PROT 6.4* 6.2* 6.3*  ALBUMIN 3.1* 2.9* 2.8*   No results for input(s): LIPASE, AMYLASE in the last 168 hours. No results for input(s): AMMONIA in the last 168 hours. CBC: Recent Labs  Lab 02/27/19 1021 02/28/19 1033 03/01/19 0250 03/02/19 1500 03/03/19 0901  WBC 29.4* 21.0* 16.6* 14.1* 13.6*  NEUTROABS  --  14.3* 9.8*  --   --   HGB 13.0 11.7* 12.0 12.0 12.4  HCT 39.3 36.0 35.8* 35.6* 37.3  MCV 93.8 94.5 92.0 92.7 92.8  PLT 515* 519* 520* 567* 584*   Cardiac Enzymes: No results for input(s): CKTOTAL, CKMB, CKMBINDEX, TROPONINI in the last 168 hours. BNP: Invalid input(s): POCBNP CBG: Recent Labs  Lab 03/03/19 0726 03/03/19 1231 03/03/19 1635 03/03/19 2117 03/04/19 0732  GLUCAP 127* 70 108* 120* 105*    D-Dimer No results for input(s): DDIMER in the last 72 hours. Hgb A1c No results for input(s): HGBA1C in the last 72 hours. Lipid Profile No results for input(s): CHOL, HDL, LDLCALC, TRIG, CHOLHDL, LDLDIRECT in the last 72 hours. Thyroid function studies No results for input(s): TSH, T4TOTAL, T3FREE, THYROIDAB in the last 72 hours.  Invalid input(s): FREET3 Anemia work up No results for input(s): VITAMINB12, FOLATE, FERRITIN, TIBC, IRON, RETICCTPCT in the last 72 hours. Urinalysis    Component Value Date/Time   COLORURINE YELLOW 12/18/2009 [place] 12/18/2009 0842   LABSPEC 1.025 12/18/2009 0842   PHURINE 5.5 12/18/2009 0842   GLUCOSEU NEGATIVE 12/18/2009 0842   HGBUR TRACE (A) 12/18/2009 0842   BILIRUBINUR n 07/22/2012 [place] 12/18/2009 0842   PROTEINUR n 07/22/2012 0852   PROTEINUR NEGATIVE 12/18/2009 0842   UROBILINOGEN negative 07/22/2012 0852   UROBILINOGEN 0.2 12/18/2009 0998  NITRITE n 07/22/2012 0852   NITRITE NEGATIVE 12/18/2009 0842   LEUKOCYTESUR Negative 07/22/2012 0852   Sepsis Labs Invalid input(s): PROCALCITONIN,  WBC,  LACTICIDVEN Microbiology Recent Results (from the past 240 hour(s))  C difficile quick scan w PCR reflex     Status: Abnormal   Collection Time: 02/27/19 11:15 AM   Specimen: Per Rectum; Stool  Result Value Ref Range Status   C Diff antigen POSITIVE (A) NEGATIVE Final    Comment: CRITICAL RESULT CALLED TO, READ BACK BY AND VERIFIED WITH: RN Candence Sease PAYAN _0  02/26/18 [facility]    C Diff toxin POSITIVE (A) NEGATIVE Final    Comment: CRITICAL RESULT CALLED TO, READ BACK BY AND VERIFIED WITH: RN Torien Ramroop PAYAN _1  02/26/18 [facility]    C Diff interpretation Toxin producing C. difficile detected.  Final    Comment: CRITICAL RESULT CALLED TO, READ BACK BY AND VERIFIED WITH: Kallista Pae PAYAN _2  02/26/18 [facility] Performed at [facility] [address] [place], [place] [address]   Blood culture (routine x  2)     Status: None (Preliminary result)   Collection Time: 02/27/19 11:16 AM   Specimen: BLOOD LEFT ARM  Result Value Ref Range Status   Specimen Description BLOOD LEFT ARM  Final   Special Requests   Final    BOTTLES DRAWN AEROBIC AND ANAEROBIC Blood Culture results may not be optimal due to an inadequate volume of blood received in culture bottles Performed at [facility] [address], [place], [place] [address]    Culture NO GROWTH 4 DAYS  Final   Report Status PENDING  Incomplete  Blood culture (routine x 2)     Status: None (Preliminary result)   Collection Time: 02/27/19 11:16 AM   Specimen: BLOOD RIGHT ARM  Result Value Ref Range Status   Specimen Description BLOOD RIGHT ARM  Final   Special Requests   Final    BOTTLES DRAWN AEROBIC AND ANAEROBIC Blood Culture adequate volume Performed at [facility], [place] [address], [place], [place] [address]    Culture NO GROWTH 4 DAYS  Final   Report Status PENDING  Incomplete     Patient was seen and examined on the day of discharge and was found to be in stable condition. Time coordinating discharge: 25 minutes including assessment and coordination of care, as well as examination of the patient.   SIGNED:  Dessa Phi, DO [facility] 03/04/2019, 9:41 AM

## 2019-03-04 NOTE — Care Management (Signed)
Pt deemed stable for discharge home today.  CM confirmed with [facility] outpt therapy on [facility] that referral for OT/PT has been received - office will follow up directly with pt.  CM reviewed chart for outstanding TOC needs - none determined.  CM signing off

## 2019-03-04 NOTE — Progress Notes (Signed)
Physical Therapy Treatment Patient Details Name: Jeanne Sims MRN: [id number] DOB: [birth year] Today's Date: 03/04/2019    History of Present Illness Pt is a 65 y.o. 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 Tested (+) C-diff. Likely extension of previous CVA; head CT with no evidence of acute abnormality. PMH includes acquired deafness (s/p cochlear implant 1983), cervical CA, ovarian CA, DM, heart murmur.   PT Comments    Pt progressing well with mobility. Able to ambulate throughout room and perform ADL tasks without DME at supervision-level. Pt agrees she no longer needs RW for home use. Encouraged more frequent RUE/RLE AROM and therex. Pt preparing for d/c home this morning; plans to follow-up with OP neuro PT.    Follow Up Recommendations  Outpatient PT;Supervision for mobility/OOB     Equipment Recommendations  None recommended by PT    Recommendations for Other Services       Precautions / Restrictions Precautions Precautions: Fall Restrictions Weight Bearing Restrictions: No    Mobility  Bed Mobility Overal bed mobility: Independent                Transfers Overall transfer level: Independent Equipment used: None Transfers: Sit to/from Stand              Ambulation/Gait Ambulation/Gait assistance: Supervision Gait Distance (Feet): 40 Feet Assistive device: None Gait Pattern/deviations: Step-through pattern;Decreased stride length   Gait velocity interpretation: 1.31 - 2.62 ft/sec, indicative of limited community ambulator General Gait Details: In-room ambulation without DME, stability improving; supervision due to fall risk. Pt agrees no longer requires RW, does not wish to take one home   Stairs Stairs: (Pt declined)           Wheelchair Mobility    Modified Rankin  (Stroke Patients Only) Modified Rankin (Stroke Patients Only) Pre-Morbid Rankin Score: Moderate disability Modified Rankin: Moderate disability     Balance Overall balance assessment: Needs assistance   Sitting balance-Leahy Scale: Normal       Standing balance-Leahy Scale: Good Standing balance comment: Able to perform dynamic standing tasks without UE support (dressing, washing upper body)                            Cognition Arousal/Alertness: Awake/alert Behavior During Therapy: WFL for tasks assessed/performed Overall Cognitive Status: Within Functional Limits for tasks assessed                                        Exercises Other Exercises Other Exercises: R shoulder AROM, R fingers/fine motor AROM    General Comments General comments (skin integrity, edema, etc.): Preparing for d/c this morning      Pertinent Vitals/Pain Pain Assessment: No/denies pain    Home Living                      Prior Function            PT Goals (current goals can now be found in the care plan section) Progress towards PT goals: Progressing toward goals    Frequency    Min 3X/week      PT Plan Current plan remains appropriate    Co-evaluation  AM-PAC PT "6 Clicks" Mobility   Outcome Measure  Help needed turning from your back to your side while in a flat bed without using bedrails?: None Help needed moving from lying on your back to sitting on the side of a flat bed without using bedrails?: None Help needed moving to and from a bed to a chair (including a wheelchair)?: None Help needed standing up from a chair using your arms (e.g., wheelchair or bedside chair)?: None Help needed to walk in hospital room?: None Help needed climbing 3-5 steps with a railing? : None 6 Click Score: 24    End of Session   Activity Tolerance: Patient tolerated treatment well Patient left: in chair;with call bell/phone within  reach;with nursing/sitter in room Nurse Communication: Mobility status PT Visit Diagnosis: Other abnormalities of gait and mobility (R26.89)     Time: 1694-5038 PT Time Calculation (min) (ACUTE ONLY): 13 min  Charges:  $Therapeutic Activity: 8-22 mins                    Mabeline Caras, PT, DPT Acute Rehabilitation Services  Pager [phone] Office [phone]  Derry Lory 03/04/2019, 10:49 AM

## 2019-03-08 LAB — CULTURE, BLOOD (ROUTINE X 2)
Culture: NO GROWTH
Culture: NO GROWTH
Special Requests: ADEQUATE

## 2019-03-09 ENCOUNTER — Telehealth: Payer: Self-pay | Admitting: Hematology and Oncology

## 2019-03-09 NOTE — Telephone Encounter (Signed)
Called and spoke with patients husband. Confirmed 3/17 appt

## 2019-03-11 ENCOUNTER — Other Ambulatory Visit: Payer: Self-pay

## 2019-03-11 ENCOUNTER — Encounter: Payer: Self-pay | Admitting: Rehabilitation

## 2019-03-11 ENCOUNTER — Ambulatory Visit: Payer: Commercial Managed Care - PPO | Attending: Internal Medicine | Admitting: Rehabilitation

## 2019-03-11 DIAGNOSIS — R531 Weakness: Secondary | ICD-10-CM | POA: Diagnosis present

## 2019-03-11 DIAGNOSIS — M6281 Muscle weakness (generalized): Secondary | ICD-10-CM | POA: Diagnosis not present

## 2019-03-11 DIAGNOSIS — R2689 Other abnormalities of gait and mobility: Secondary | ICD-10-CM

## 2019-03-11 DIAGNOSIS — R2681 Unsteadiness on feet: Secondary | ICD-10-CM | POA: Diagnosis present

## 2019-03-11 DIAGNOSIS — I69351 Hemiplegia and hemiparesis following cerebral infarction affecting right dominant side: Secondary | ICD-10-CM | POA: Insufficient documentation

## 2019-03-11 NOTE — Therapy (Signed)
[facility] [address] [place], [place], [address] Phone: [phone]   Fax:  [phone]  Physical Therapy Evaluation  Patient Details  Name: Jeanne Sims MRN: [id number] Date of Birth: August 10, 1955 Referring Provider (PT): Donnetta Hutching, MD   Encounter Date: 03/11/2019  PT End of Session - 03/11/19 1329    Visit Number  1    Number of Visits  17    Date for PT Re-Evaluation  05/10/19   60 day POC   Authorization Type  [employer]HC    Authorization - Visit Number  1    Authorization - Number of Visits  30    PT Start Time  1147    PT Stop Time  1234    PT Time Calculation (min)  47 min    Equipment Utilized During Treatment  Gait belt    Activity Tolerance  Patient tolerated treatment well;Patient limited by fatigue    Behavior During Therapy  WFL for tasks assessed/performed       Past Medical History:  Diagnosis Date  . Acquired deafness   . Cervical cancer ([place]) 1075  . Cervical cancer ([place])   . Diabetes mellitus without complication ([place])   . Dyspareunia   . Heart murmur   . Hodgkin disease ([place]) 1987  . Ovarian cancer [facility])    age 64  . Thyroid disease     Past Surgical History:  Procedure Laterality Date  . ABDOMINAL HYSTERECTOMY     TAH/BSO at age 24 d/t cervical CA  . BASAL CELL CARCINOMA EXCISION     face  . BOWEL RESECTION     followed splenectomy.  had bowel obstruction.  . COCHLEAR IMPLANT  1983   Dr. Thornell Mule  . SPLENECTOMY      There were no vitals filed for this visit.   Subjective Assessment - 03/11/19 1154    Subjective  Per pt report, "I am having issues with my R side and I have numbness in R thigh above knee.  I am also dizzy.  I have good days and bad days, today is not a good day."    Patient is accompained by:  Family member   Jamison Neighbor   Limitations  Walking;Standing;House hold activities    How long can you walk comfortably?  Less than 5 mins before having increased  fatigue.    Patient Stated Goals  "I wanna get back to the way I was."    Currently in Pain?  No/denies         [facility] PT Assessment - 03/11/19 1157      Assessment   Medical Diagnosis  CVA    Referring Provider (PT)  Donnetta Hutching, MD    Onset Date/Surgical Date  01/29/19      Precautions   Precautions  Fall    Precaution Comments  Cochlear implant, HOH      Balance Screen   Has the patient fallen in the past 6 months  No    Has the patient had a decrease in activity level because of a fear of falling?   No    Is the patient reluctant to leave their home because of a fear of falling?   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    Leisure  shopping, going out to eat, travel to [employer]    Overall Cognitive Status  Within Functional Limits for tasks assessed   delayed processing   Memory  Impaired      Sensation   Light Touch  Impaired Detail    Light Touch Impaired Details  Impaired RLE   thigh to knee numbness-painful at times   Hot/Cold  Impaired Detail    Hot/Cold Impaired Details  Impaired RLE   Cant discern hot from cold on R thigh     Coordination   Gross Motor Movements are Fluid and Coordinated  No    Fine Motor Movements are Fluid and Coordinated  No    Coordination and Movement Description  decreased in RLE due to weakness     Heel Shin Test  decreased due to strength deficits       ROM / Strength    AROM / PROM / Strength  Strength      Strength   Overall Strength  Deficits    Overall Strength Comments  Demonstrates R hip abd weakness during gait, did not formally test,     Strength Assessment Site  Hip;Knee;Ankle    Right/Left Hip  Right    Right Hip Flexion  2/5    Right/Left Knee  Right    Right Knee Flexion  2+/5    Right Knee Extension  3/5    Right/Left Ankle  Right    Right Ankle Dorsiflexion  3/5    Right Ankle Plantar Flexion  3+/5      Transfers   Transfers  Sit to Stand;Stand to Sit    Sit to Stand  4: Min assist;With upper extremity assist;From chair/3-in-1   heavy reliance hands on LEs   Five time sit to stand comments   61.78 seconds   SpO2: 97%; HR: 97 bpm     Ambulation/Gait   Ambulation/Gait  Yes    Ambulation/Gait Assistance  4: Min assist    Ambulation Distance (Feet)  150 Feet    Assistive device  None    Gait Pattern  Step-through pattern;Decreased arm swing - right;Decreased step length - left;Decreased dorsiflexion - right;Decreased weight shift to right;Poor foot clearance - right    Ambulation Surface  Level;Indoor    Gait velocity  1.21 ft/s   27.19 seconds   Stairs  Yes    Stairs Assistance  4: Min assist    Stairs Assistance Details (indicate cue type and reason)  requires cueing for safety with sequencing and technique    Stair Management Technique  Two rails;Step to pattern;Forwards    Number of Stairs  4    Height of Stairs  6    Gait Comments  Following gait and stairs assessment notably winded, but vitals stable (SpO2: 97%; HR: 97 bpm)      Standardized Balance Assessment   Standardized Balance Assessment  Berg Balance Test      [facility] Balance Test   Sit  to Stand  Able to stand using hands after several tries   close S needed for safety   Standing Unsupported  Able to stand 30 seconds unsupported   able to tolerate 1 minute of standing    Sitting with Back Unsupported but Feet Supported on Floor or Stool  Able to sit safely and  securely 2 minutes    Stand to Sit  Uses backs of legs against chair to control descent    Transfers  Able to transfer with verbal cueing and /or supervision    Standing Unsupported with Eyes Closed  Needs help to keep from falling   incr post sway   Standing Unsupported with Feet Together  Needs help to attain position and unable to hold for 15 seconds    From Standing, Reach Forward with Outstretched Arm  Can reach forward >5 cm safely (2")   7.5 cm with L hand reaching   From Standing Position, Pick up Object from Floor  Unable to pick up shoe, but reaches 2-5 cm (1-2") from shoe and balances independently    From Standing Position, Turn to Look Behind Over each Shoulder  Turn sideways only but maintains balance    Turn 360 Degrees  Needs assistance while turning    Standing Unsupported, Alternately Place Feet on Step/Stool  Needs assistance to keep from falling or unable to try    Standing Unsupported, One Foot in Front  Loses balance while stepping or standing    Standing on One Leg  Unable to try or needs assist to prevent fall    Total Score  18    Berg comment:  Scores <36/56 indicate high risk for falling        Subjective through Coordination was completed by: Cameron Sprang, PT, MPT [facility] [address] [place], [place], [address] Phone: [phone]   Fax:  [phone] 03/11/19, 2:53 PM         Objective measurements completed on examination: See above findings.              PT Education - 03/11/19 1327    Education Details  findings from today's standardized testing including findings indiciating fall risk, use of AD to increase safety and PT's plan to trial [facility], rollator and/or 2WRW in future sessions, POC    Person(s) Educated  Patient;Spouse   husband - Linna Hoff   Methods  Explanation    Comprehension  Verbalized understanding       PT Short Term Goals - 03/11/19 1349      PT SHORT TERM GOAL #1    Title  Patient will be independent with initial HEP in order to indicate decreased fall risk and improved functional mobility. (ALL STGs due 04/10/19)    Time  4    Period  Weeks    Status  New      PT SHORT TERM GOAL #2   Title  Patient's gait velocity with LRAD will be >/= 1.52 ft/s in order to demonstrate a decreased risk for falling.    Baseline  1.22 ft/s with no AD, but close S - min A required from PT    Time  4    Period  Weeks    Status  New      PT SHORT TERM GOAL #3   Title  Patient will score < 45 seconds on the 5 time sit to stand test in order to demonstrate improvement in her functional lower extremity strength and  a decrease in risk of falling.    Baseline  61.78 seconds    Time  4    Period  Weeks    Status  New      PT SHORT TERM GOAL #4   Title  Patient will score >/= 24/56 on the Berg Balance Test in order to demonstrate a decrease in her risk of falling.    Baseline  18/56    Time  4    Period  Weeks    Status  New      PT SHORT TERM GOAL #5   Title  The patient will demonstrate ability to ambulate >/= 150 feet with LRAD and supervision to indicate improvement in safety with household ambulation.    Baseline  no AD with close S - min A required for safety    Time  4    Period  Weeks    Status  New        PT Long Term Goals - 03/11/19 1346      PT LONG TERM GOAL #1   Title  Patient will be independent with initial HEP in order to indicate decreased fall risk and improved functional mobility. (All LTGs due 05/10/19    Time  8    Period  Weeks    Target Date  05/10/19      PT LONG TERM GOAL #2   Title  Patient's gait velocity with LRAD will be >/= 1.8 ft/s in order to demonstrate a decreased risk for falling.    Time  8    Period  Weeks    Status  New      PT LONG TERM GOAL #3   Title  Patient will score </= 18 seconds on the 5 time sit to stand test in order to demonstrate improvement in her functional lower extremity strength and decrease in risk of  falls.    Time  8    Period  Weeks    Status  New      PT LONG TERM GOAL #4   Title  Patient will score >45/56 on the berg balance test in order to demonstrate a decrease in fall risk.    Time  8    Period  Weeks    Status  New      PT LONG TERM GOAL #5   Title  Patient will demonstrate ability to ambulate >/= 500 feet with LRAD and mod I on indoor and outdoor surfaces to indicate safety with return to full community ambulation.    Time  8    Period  Weeks    Status  New      Additional Long Term Goals   Additional Long Term Goals  Yes             Plan - 03/11/19 1332    Clinical Impression Statement  Patient is a 64 year old female referred to Neuro OPPT for evaluation with primary concern of CVA with residual R sided weakness.  Pt's PMH is significant for: COVID positive 01/29/19 with subsequent CVA with R sided weakness 02/22/19 and PE, DM type 2, HLD, hx of Hodgkin's lymphoma and cervical/ovarian cancer (in remission), hearing impaired (cochlear implant) . The following deficits were present during the exam: impaired gait mechanics and decreased and asymmetrical lower extremity strength and range of motion. Pt's 5 time sit to stand test score of 61.78 seconds and Berg balance test score of 18/56 both indicate that she is at  an increased risk for falling.  Additionally, her gait velocity of 1.39ft/s (no AD, but close S - min A from PT) indicates she is a high fall risk and only a household ambulatory. 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 Pt would benefit from skilled PT to address these impairments and functional limitations to maximize functional mobility independence, improve functional strength, and decrease risk of falls.    Personal Factors and Comorbidities  Comorbidity 3+    Comorbidities  COVID positive 01/29/19 wiht subsequent CVA with R sided weakness 02/22/19 and PE, DM type 2, HLD, hx of  Hodgkin's lymphoma and cervical/ovarian cancer (in remission)    Examination-Activity Limitations  Transfers;Bend;Lift;Squat;Locomotion Level;Stairs;Stand    Examination-Participation Restrictions  Cleaning;Laundry;Community Activity;Shop;Driving;Meal Prep    Stability/Clinical Decision Making  Evolving/Moderate complexity    Clinical Decision Making  Moderate    Rehab Potential  Excellent    PT Frequency  2x / week    PT Duration  8 weeks    PT Treatment/Interventions  ADLs/Self Care Home Management;Cryotherapy;Electrical Stimulation;Moist Heat;DME Instruction;Gait training;Therapeutic exercise;Therapeutic activities;Functional mobility training;Stair training;Balance training;Neuromuscular re-education;Cognitive remediation;Patient/family education;Orthotic Fit/Training;Manual techniques;Passive range of motion;Energy conservation    PT Next Visit Plan  initiate HEP for functional strengthening and R weight shift as indicated/able, begin trial of AD (no order placed for AD to date - trial in PT first, then make recommendation)    Recommended Other Services  have someone look up FOTO score and send to Cameron Sprang or [employer] and Agree with Plan of Care  Patient;Family member/caregiver    Family Member Consulted  husband - [name]       Patient will benefit from skilled therapeutic intervention in order to improve the following deficits and impairments:  Abnormal gait, Decreased balance, Decreased endurance, Decreased mobility, Difficulty walking, Impaired sensation, Cardiopulmonary status limiting activity, Decreased knowledge of precautions, Decreased range of motion, Dizziness, Improper body mechanics, Decreased activity tolerance, Decreased coordination, Decreased knowledge of use of DME, Decreased safety awareness, Decreased strength, Impaired flexibility, Postural dysfunction, Decreased cognition  Visit Diagnosis: Muscle weakness (generalized) - Plan: PT plan of care  cert/re-cert  Other abnormalities of gait and mobility - Plan: PT plan of care cert/re-cert  Hemiplegia and hemiparesis following cerebral infarction affecting right dominant side ([place]) - Plan: PT plan of care cert/re-cert  Unsteadiness on feet - Plan: PT plan of care cert/re-cert     Problem List Patient Active Problem List   Diagnosis Date Noted  . C. difficile diarrhea 02/27/2019  . CVA (cerebrovascular accident) ([place]) 02/27/2019  . Cerebrovascular accident (CVA) ([place])   . HLD (hyperlipidemia) 02/21/2019  . Controlled type 2 diabetes mellitus with hyperglycemia ([place]) 02/21/2019  . Hypothyroidism 02/21/2019  . Acute pulmonary embolism ([place]) 02/21/2019  . COVID-19 virus infection 02/21/2019  . PE (physical exam), annual 02/20/2019  . Acute pulmonary embolism without acute cor pulmonale ([place]) 02/20/2019    Mitchell, PT, DPT  03/11/2019, 2:53 PM  [facility] [address] [place], [place], [address] Phone: [phone]   Fax:  [phone]  Name: Jeanne Sims MRN: [id number] Date of Birth: 1955-12-03

## 2019-03-21 ENCOUNTER — Encounter: Payer: Self-pay | Admitting: Rehabilitation

## 2019-03-21 ENCOUNTER — Other Ambulatory Visit: Payer: Self-pay

## 2019-03-21 ENCOUNTER — Ambulatory Visit: Payer: Commercial Managed Care - PPO | Admitting: Rehabilitation

## 2019-03-21 DIAGNOSIS — R2681 Unsteadiness on feet: Secondary | ICD-10-CM

## 2019-03-21 DIAGNOSIS — M6281 Muscle weakness (generalized): Secondary | ICD-10-CM

## 2019-03-21 DIAGNOSIS — I69351 Hemiplegia and hemiparesis following cerebral infarction affecting right dominant side: Secondary | ICD-10-CM

## 2019-03-21 DIAGNOSIS — R2689 Other abnormalities of gait and mobility: Secondary | ICD-10-CM

## 2019-03-21 NOTE — Therapy (Signed)
[facility] [address] [place], [place], [address] Phone: [phone]   Fax:  [phone]  Physical Therapy Treatment  Patient Details  Name: Jeanne Sims MRN: [id number] Date of Birth: Jan 31, 1955 Referring Provider (PT): Donnetta Hutching, MD   Encounter Date: 03/21/2019  PT End of Session - 03/21/19 1242    Visit Number  2    Number of Visits  17    Date for PT Re-Evaluation  05/10/19   60 day POC   Authorization Type  [employer]    Authorization - Visit Number  2    Authorization - Number of Visits  30    PT Start Time  0092    PT Stop Time  1220    PT Time Calculation (min)  45 min    Equipment Utilized During Treatment  Gait belt    Activity Tolerance  Patient tolerated treatment well;Patient limited by fatigue    Behavior During Therapy  WFL for tasks assessed/performed       Past Medical History:  Diagnosis Date  . Acquired deafness   . Cervical cancer ([place]) 64  . Cervical cancer ([place])   . Diabetes mellitus without complication ([place])   . Dyspareunia   . Heart murmur   . Hodgkin disease ([place]) 1987  . Ovarian cancer [facility])    age 64  . Thyroid disease     Past Surgical History:  Procedure Laterality Date  . ABDOMINAL HYSTERECTOMY     TAH/BSO at age 64 d/t cervical CA  . BASAL CELL CARCINOMA EXCISION     face  . BOWEL RESECTION     followed splenectomy.  had bowel obstruction.  . COCHLEAR IMPLANT  1983   Dr. Thornell Mule  . SPLENECTOMY      There were no vitals filed for this visit.  Subjective Assessment - 03/21/19 1135    Subjective  Pt participated in some water aerobic over the last week x 4 days.  Has been doing more walking.  Looks better walking into clinic.    Patient is accompained by:  Family member    Limitations  Walking;Standing;House hold activities    Currently in Pain?  No/denies                       [facility] Adult PT Treatment/Exercise - 03/21/19 1147      Ambulation/Gait   Ambulation/Gait  Yes    Ambulation/Gait Assistance  5: Supervision    Ambulation/Gait Assistance Details  S with cues for improved R heel stike and heel to toe contact.  Pt ambulated x 250' without stopping without AD today at S level.  Note MARKED improvement in gait, mobility and endurance from previous session.  PT did get rollator out for her to trial for community use.  Pt reports she was able to borrow one of these from friend.  Pt mod I with rollator (did provide cues on how to lock and turn and sit if fatigued) but educated to use this when ambulating in community/leisure walking/exercise to improve endurance and distance.  Pt and husband verbalized understanding.      Ambulation Distance (Feet)  250 Feet   then 50' with rollator   Assistive device  4-wheeled walker;None    Gait Pattern  Step-through pattern;Decreased arm swing - right;Decreased step length - left;Decreased dorsiflexion - right;Decreased weight shift to right;Poor foot clearance - right    Ambulation Surface  Level;Indoor    Stairs  Yes  Stairs Assistance  5: Supervision    Stairs Assistance Details (indicate cue type and reason)  Cues to ascend/descend reciprocally to work on strength.  Educated to use BUE support progressing to single UE support as able at home.     Stair Management Technique  Two rails;Alternating pattern;Forwards    Number of Stairs  8    Height of Stairs  6      Self-Care   Self-Care  Other Self-Care Comments    Other Self-Care Comments   Provided education and purpose of aquatic therapy.  Pt has made great progress with aquatic exercise over the last week and is interested in pool PT, therefore provided handout and am working on getting her added to schedule next week.        Neuro Re-ed    Neuro Re-ed Details   Counter top and corner balance exercises for improved balance and strength.  See pt instructions for details.  Also performed step ups with opposite extremity march  (LLE) x 10 reps at stairs with BUE support.          Access Code: [email or web address] URL: [email or web address] Date: 03/21/2019 Prepared by: Cameron [facility]  Exercises Standing Hip Flexion March - 1-2 x daily - 7 x weekly - 1 sets - 4 reps Tandem Walking with Counter Support - 1-2 x daily - 7 x weekly - 1 sets - 4 reps Step Up - 1 x daily - 7 x weekly - 1 sets - 10 reps Romberg Stance on Foam Pad - 1-2 x daily - 7 x weekly - 1 sets - 3 reps - 20 hold Romberg Stance on Foam Pad with Head Rotation - 1 x daily - 7 x weekly - 1 sets - 10 reps Romberg Stance with Head Nods on Foam Pad - 1 x daily - 7 x weekly - 1 sets - 10 reps Wide Stance with Eyes Closed on Foam Pad - 1 x daily - 7 x weekly - 1 sets - 3 reps - 15 secs hold       PT Education - 03/21/19 1242    Education Details  HEP, aquatic therapy    Person(s) Educated  Patient;Spouse    Methods  Explanation;Handout;Demonstration    Comprehension  Verbalized understanding;Returned demonstration       PT Short Term Goals - 03/11/19 1349      PT SHORT TERM GOAL #1   Title  Patient will be independent with initial HEP in order to indicate decreased fall risk and improved functional mobility. (ALL STGs due 04/10/19)    Time  4    Period  Weeks    Status  New      PT SHORT TERM GOAL #2   Title  Patient's gait velocity with LRAD will be >/= 1.52 ft/s in order to demonstrate a decreased risk for falling.    Baseline  1.22 ft/s with no AD, but close S - min A required from PT    Time  4    Period  Weeks    Status  New      PT SHORT TERM GOAL #3   Title  Patient will score < 45 seconds on the 5 time sit to stand test in order to demonstrate improvement in her functional lower extremity strength and a decrease in risk of falling.    Baseline  61.78 seconds    Time  4    Period  Weeks    Status  New  PT SHORT TERM GOAL #4   Title  Patient will score >/= 24/56 on the Berg Balance Test in order to demonstrate a decrease  in her risk of falling.    Baseline  18/56    Time  4    Period  Weeks    Status  New      PT SHORT TERM GOAL #5   Title  The patient will demonstrate ability to ambulate >/= 150 feet with LRAD and supervision to indicate improvement in safety with household ambulation.    Baseline  no AD with close S - min A required for safety    Time  4    Period  Weeks    Status  New        PT Long Term Goals - 03/11/19 1346      PT LONG TERM GOAL #1   Title  Patient will be independent with initial HEP in order to indicate decreased fall risk and improved functional mobility. (All LTGs due 05/10/19    Time  8    Period  Weeks    Target Date  05/10/19      PT LONG TERM GOAL #2   Title  Patient's gait velocity with LRAD will be >/= 1.8 ft/s in order to demonstrate a decreased risk for falling.    Time  8    Period  Weeks    Status  New      PT LONG TERM GOAL #3   Title  Patient will score </= 18 seconds on the 5 time sit to stand test in order to demonstrate improvement in her functional lower extremity strength and decrease in risk of falls.    Time  8    Period  Weeks    Status  New      PT LONG TERM GOAL #4   Title  Patient will score >45/56 on the berg balance test in order to demonstrate a decrease in fall risk.    Time  8    Period  Weeks    Status  New      PT LONG TERM GOAL #5   Title  Patient will demonstrate ability to ambulate >/= 500 feet with LRAD and mod I on indoor and outdoor surfaces to indicate safety with return to full community ambulation.    Time  8    Period  Weeks    Status  New      Additional Long Term Goals   Additional Long Term Goals  Yes            Plan - 03/21/19 1243    Clinical Impression Statement  Pt presents today with marked improvement in overall mobility, balance and endurance.  She reports she did 4 days of aquatic aerobics last week and walking with husband at home.  Pt interested in aquatic PT here, therefore will work to get her on  PT schedule.  Remainder of session focused on HEP for balance/strength.  Pt also has rollator at home for community/leisure walking, therefore will not need AD.    Personal Factors and Comorbidities  Comorbidity 3+    Comorbidities  COVID positive 01/29/19 wiht subsequent CVA with R sided weakness 02/22/19 and PE, DM type 2, HLD, hx of Hodgkin's lymphoma and cervical/ovarian cancer (in remission)    Examination-Activity Limitations  Transfers;Bend;Lift;Squat;Locomotion Level;Stairs;Stand    Examination-Participation Restrictions  Cleaning;Laundry;Community Activity;Shop;Driving;Meal Prep    Stability/Clinical Decision Making  Evolving/Moderate complexity    Rehab Potential  Excellent    PT Frequency  2x / week    PT Duration  8 weeks    PT Treatment/Interventions  ADLs/Self Care Home Management;Cryotherapy;Electrical Stimulation;Moist Heat;DME Instruction;Gait training;Therapeutic exercise;Therapeutic activities;Functional mobility training;Stair training;Balance training;Neuromuscular re-education;Cognitive remediation;Patient/family education;Orthotic Fit/Training;Manual techniques;Passive range of motion;Energy conservation    PT Next Visit Plan  How is HEP going, continue R LE strength (esp hip flex), SLS, compliant surfaces, EC, outdoor gait    Consulted and Agree with Plan of Care  Patient;Family member/caregiver    Family Member Consulted  husband - [name]       Patient will benefit from skilled therapeutic intervention in order to improve the following deficits and impairments:  Abnormal gait, Decreased balance, Decreased endurance, Decreased mobility, Difficulty walking, Impaired sensation, Cardiopulmonary status limiting activity, Decreased knowledge of precautions, Decreased range of motion, Dizziness, Improper body mechanics, Decreased activity tolerance, Decreased coordination, Decreased knowledge of use of DME, Decreased safety awareness, Decreased strength, Impaired flexibility, Postural  dysfunction, Decreased cognition  Visit Diagnosis: Muscle weakness (generalized)  Other abnormalities of gait and mobility  Hemiplegia and hemiparesis following cerebral infarction affecting right dominant side (HCC)  Unsteadiness on feet     Problem List Patient Active Problem List   Diagnosis Date Noted  . C. difficile diarrhea 02/27/2019  . CVA (cerebrovascular accident) ([place]) 02/27/2019  . Cerebrovascular accident (CVA) ([place])   . HLD (hyperlipidemia) 02/21/2019  . Controlled type 2 diabetes mellitus with hyperglycemia ([place]) 02/21/2019  . Hypothyroidism 02/21/2019  . Acute pulmonary embolism ([place]) 02/21/2019  . COVID-19 virus infection 02/21/2019  . PE (physical exam), annual 02/20/2019  . Acute pulmonary embolism without acute cor pulmonale ([place]) 02/20/2019    Cameron Spr[facility], PT, MPT [facility] [address] [place], [place], [address] Phone: [phone]   Fax:  [phone] 03/21/19, 12:46 PM  Name: Jeanne Sims MRN: [id number] Date of Birth: April 10, 1955

## 2019-03-21 NOTE — Patient Instructions (Addendum)
Aquatic Therapy: What to Expect!  Where:  [facility] [address] [place], [place]  [address] [phone]  NOTE:  Jeanne Sims will receive an automated phone message reminding you of your appointment and it will say the appointment is at the [facility] on [address]  We are working to fix this- just know that you will meet [place] at the pool!  How to Prepare: . Please make sure you drink 8 ounces of water about one hour prior to your pool session . A caregiver MUST attend the entire session with the patient.  The caregiver will be responsible for assisting with dressing as well as any toileting needs.  If the patient will be doing a home program this should likely be the person who will assist as well.  . Patients must wear either their street shoes or pool shoes until they are ready to enter the pool with the therapist.  Patients must also wear either street shoes or pool shoes once exiting the pool to walk to the locker room.  This will helps [place] prevent slips and falls.  . Please arrive 15 minutes early to prepare for your pool therapy session . Sign in at the front desk on the clipboard marked for Jeanne Sims . You may use the locker rooms on your right and then enter directly into the recreation pool (NOT the competition pool) . Please make sure to attend to any toileting needs prior to entering the pool . Please be dressed in your swim suit and on the pool deck at least 5 minutes before your appointment . Once on the pool deck your therapist will ask you to sign the Patient  Consent and Assignment of Benefits form . Your therapist may take your blood pressure prior to, during and after your session if indicated  About the pool  and parking: 1. Entering the pool Your therapist will assist you; there are multiple ways to enter including stairs with railings, a walk in ramp, a roll in chair and a mechanical lift. Your therapist will determine the most appropriate way for  you. 2. Water temperature is usually between 86-87 degrees 3. There may be other swimmers in the pool at the same time 4. Parking is free: if you arrive and there is a parking attendant please inform them you are there for therapy and do not pay to park. Handicapped parking is located at the front entrance.  Contact Info:     Appointments: [facility]  All sessions are 45 minutes   [place] [address]     Please call the [facility] if   Jeanne Sims, Jeanne Sims   [phone]    you need to cancel or reschedule an appointment.  [phone]         Access Code: [email or web address] URL: [email or web address] Date: 03/21/2019 Prepared by: Cameron [facility]  Exercises Standing Hip Flexion March - 1-2 x daily - 7 x weekly - 1 sets - 4 reps Tandem Walking with Counter Support - 1-2 x daily - 7 x weekly - 1 sets - 4 reps Step Up - 1 x daily - 7 x weekly - 1 sets - 10 reps Romberg Stance on Foam Pad - 1-2 x daily - 7 x weekly - 1 sets - 3 reps - 20 hold Romberg Stance on Foam Pad with Head Rotation - 1 x daily - 7 x weekly - 1 sets - 10 reps Romberg Stance with Head Nods on  Foam Pad - 1 x daily - 7 x weekly - 1 sets - 10 reps Wide Stance with Eyes Closed on Foam Pad - 1 x daily - 7 x weekly - 1 sets - 3 reps - 15 secs hold

## 2019-03-22 ENCOUNTER — Other Ambulatory Visit: Payer: Self-pay

## 2019-03-22 ENCOUNTER — Other Ambulatory Visit: Payer: Self-pay | Admitting: Hematology and Oncology

## 2019-03-22 DIAGNOSIS — D72829 Elevated white blood cell count, unspecified: Secondary | ICD-10-CM

## 2019-03-22 DIAGNOSIS — D473 Essential (hemorrhagic) thrombocythemia: Secondary | ICD-10-CM

## 2019-03-22 DIAGNOSIS — D75839 Thrombocytosis, unspecified: Secondary | ICD-10-CM

## 2019-03-22 NOTE — Progress Notes (Signed)
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 Only prior lab on record. Further workup at that time was for a UTI  B) 09/21/2018: WBC 13.3, Hgb 12.9, Plt 549 C)10/26/2018: Establish care with Dr. Lorenso Courier. WBC 14.0, Hgb 12.7, Plt 536. MPN workup including JAK2 w/ reflex and BCR ABL were negative for a driver mutation.[name]  #Hodgkin Disease, s/p chemotherapy and radiation 1) patient reports she was treated by Dr. Sonny Dandy here at [facility] in 1987 2) Received chemotherapy (? Regimen) and radiation therapy   #Ovarian Cancer/Cervical Cancer 1) discovered after giving birth at age 65 and had continual post-partum bleeding 2) s/p TAH/BSO. Patient noted she had both ovarian and cervical cancer.  #Pulmonary Embolism in Setting of COVID Infection 1) 02/20/2019: CT PE study showed Acute segmental pulmonary emboli in the right middle lobe and right lower. Acute subsegmental embolus in the left lower lobe. This was in the setting of COVID infection. Started on eliquis therapy.   Interval History:  Jeanne Sims 64 y.o. female with medical history significant for ovarian cancer (age 68, s/p TAH/BSO), and Hodgkin disease (1987) who presents for a follow up visit. She was last seen on 11/17/2018. 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   On exam today Ms. Littman notes that she has had a rough couple weeks.  She notes that she continues to have shortness of breath and chest discomfort following the diagnosis of PE and Covid infection on 02/20/2019.  She notes that she has not had any issues with cough and only has some chest discomfort and dyspnea on exertion.   Patient also notes that she has residual numbness on the right leg from the knee down to the thigh.  There are also some motor deficits associated with this numbness, which are somewhat improved from the time of diagnosis.  The patient notes that both of these clots occurred while taking baby aspirin daily.  She has discontinued the baby aspirin and is on Eliquis alone at this time.  On further discussion the patient notes that she has been having issues with headaches and that she occasionally has some bleeding when she blows her nose.  She denies having any frank bleeding, dark stools, or bruising.  She notes that she continues to have heavy sweats at night.  The diarrhea she was having from her C. difficile infection is subsequently cleared up and her bowel movements are moving normally.  She notes that she continues to have a sore throat in the morning but otherwise no infectious symptoms.  She denies any nausea, vomiting, diarrhea, or abdominal pain.  A full 10 point ROS is listed below.  A 10 point ROS is listed below.   MEDICAL HISTORY:  Past Medical History:  Diagnosis Date  . Acquired deafness   . Cervical cancer ([place]) 1075  . Cervical cancer ([place])   . Diabetes mellitus without complication ([place])   . Dyspareunia   . Heart murmur   . Hodgkin disease ([place]) 1987  . Ovarian cancer [facility])    age 64  . Thyroid disease     SURGICAL HISTORY: Past Surgical History:  Procedure Laterality Date  . ABDOMINAL HYSTERECTOMY  TAH/BSO at age 36 d/t cervical CA  . BASAL CELL CARCINOMA EXCISION     face  . BOWEL RESECTION     followed splenectomy.  had bowel obstruction.  . COCHLEAR IMPLANT  1983   Dr. Thornell Mule  . SPLENECTOMY      SOCIAL HISTORY: Social History   Socioeconomic History  . Marital status: Married    Spouse name: Not on file  . Number of children: Not on file  . Years of education: Not on file  . Highest education level: Not on file  Occupational History  . Not on file   Tobacco Use  . Smoking status: Never Smoker  . Smokeless tobacco: Never Used  Substance and Sexual Activity  . Alcohol use: No  . Drug use: No  . Sexual activity: Never    Comment: TAH/BSO  Other Topics Concern  . Not on file  Social History Narrative  . Not on file   Social Determinants of Health   Financial Resource Strain:   . Difficulty of Paying Living Expenses:   Food Insecurity:   . Worried About Charity fundraiser in the Last Year:   . Arboriculturist in the Last Year:   Transportation Needs:   . Film/video editor (Medical):   [name] Lack of Transportation (Non-Medical):   Physical Activity:   . Days of Exercise per Week:   . Minutes of Exercise per Session:   Stress:   . Feeling of Stress :   Social Connections:   . Frequency of Communication with Friends and Family:   . Frequency of Social Gatherings with Friends and Family:   . Attends Religious Services:   . Active Member of Clubs or Organizations:   . Attends Archivist Meetings:   [name] Marital Status:   Intimate Partner Violence:   . Fear of Current or Ex-Partner:   . Emotionally Abused:   [name] Physically Abused:   . Sexually Abused:     FAMILY HISTORY: Family History  Problem Relation Age of Onset  . Heart failure Mother   . Diabetes Mother   . Diabetes Brother   . Diabetes Sister   . Cervical cancer Sister   . Diabetes Sister   . Cervical cancer Sister   . Diabetes Sister   . Diabetes Brother   . Breast cancer Paternal Uncle     ALLERGIES:  has No Known Allergies.  MEDICATIONS:  Current Outpatient Medications  Medication Sig Dispense Refill  . apixaban (ELIQUIS) 5 MG TABS tablet Take 2 tablets (10 mg total) by mouth 2 (two) times daily for 5 days. LAST DOSE ON 03/01/19-BEFORE SWITCHING TO 5 MG TWICE DAILY ON 2/24 (Patient taking differently: Take 10 mg by mouth 2 (two) times daily. TAKE 2 TABLETS (10 MG TOTALLY) BY MOUTH TWICE DAILY UNTIL 03/29/2019; THEN TAKE 1 TABLET (5 MG TOTALLY)  BY MOUTH TWICE DAILY ON 03/02/2019) 60 tablet 0  . Cyanocobalamin 1000 MCG/ML KIT Inject as directed every 30 (thirty) days.    [name] dexlansoprazole (DEXILANT) 60 MG capsule Take 60 mg by mouth daily.    . Dulaglutide (TRULICITY) 1.5 VC/9.4WH SOPN Inject 1.5 mg into the skin once a week.     . ferrous sulfate 325 (65 FE) MG tablet Take 1 tablet (325 mg total) by mouth daily with breakfast. Please take with a source of vitamin C (orange juice or tablet with 500 units of Vitamin C). 90 tablet 3  . folic acid (FOLVITE) 675 MCG  tablet Take 800 mcg by mouth daily.    [name] levothyroxine (EUTHYROX) 175 MCG tablet Take 175 mcg by mouth daily before breakfast.    . metFORMIN (GLUCOPHAGE) 500 MG tablet Take 500 mg by mouth 2 (two) times daily with a meal.    . metoprolol succinate (TOPROL-XL) 25 MG 24 hr tablet Take 12.5 mg by mouth daily.     . rosuvastatin (CRESTOR) 20 MG tablet Take 1 tablet (20 mg total) by mouth daily. 30 tablet 0  . Vitamin D, Ergocalciferol, (DRISDOL) 1.25 MG (50000 UT) CAPS capsule Take 50,000 Units by mouth every 7 (seven) days.     No current facility-administered medications for this visit.    REVIEW OF SYSTEMS:   Constitutional: ( - ) fevers, ( + )  chills , ( + ) night sweats Eyes: ( - ) blurriness of vision, ( - ) double vision, ( - ) watery eyes Ears, nose, mouth, throat, and face: ( - ) mucositis, ( - ) sore throat Respiratory: ( - ) cough, ( + ) dyspnea, ( - ) wheezes Cardiovascular: ( - ) palpitation, ( - ) chest discomfort, ( - ) lower extremity swelling Gastrointestinal:  ( - ) nausea, ( - ) heartburn, ( - ) change in bowel habits Skin: ( - ) abnormal skin rashes Lymphatics: ( - ) new lymphadenopathy, ( - ) easy bruising Neurological: ( - ) numbness, ( - ) tingling, ( - ) new weaknesses Behavioral/Psych: ( - ) mood change, ( - ) new changes  All other systems were reviewed with the patient and are negative.  PHYSICAL EXAMINATION: ECOG PERFORMANCE STATUS: 0 -  Asymptomatic  Vitals:   03/23/19 0954  BP: (!) 110/55  Pulse: 88  Resp: 18  Temp: 98.5 F (36.9 C)  SpO2: 99%   Filed Weights   03/23/19 0954  Weight: 183 lb 12.8 oz (83.4 kg)    GENERAL: well appearing Caucasian female in NAD  SKIN: skin color, texture, turgor are normal, no rashes or significant lesions EYES: conjunctiva are pink and non-injected, sclera clear LUNGS: clear to auscultation and percussion with normal breathing effort HEART: regular rate & rhythm and no murmurs and no lower extremity edema Musculoskeletal: no cyanosis of digits and no clubbing  PSYCH: alert & oriented x 3, fluent speech NEURO: no focal motor/sensory deficits  LABORATORY DATA:  I have reviewed the data as listed Lab Results  Component Value Date   WBC 12.6 (H) 03/23/2019   HGB 12.8 03/23/2019   HCT 39.1 03/23/2019   MCV 92.4 03/23/2019   PLT 499 (H) 03/23/2019   NEUTROABS 6.8 03/23/2019   Lab Results  Component Value Date   IRON 43 10/27/2018   TIBC 353 10/27/2018   FERRITIN 79 02/21/2019   CMP Latest Ref Rng & Units 03/23/2019 03/01/2019 02/28/2019  Glucose 70 - 99 mg/dL 130(H) 128(H) 149(H)  BUN 8 - 23 mg/dL 12 5(L) 6(L)  Creatinine 0.44 - 1.00 mg/dL 0.67 0.63 0.66  Sodium 135 - 145 mmol/L 142 141 139  Potassium 3.5 - 5.1 mmol/L 3.9 3.5 3.8  Chloride 98 - 111 mmol/L 105 106 104  CO2 22 - 32 mmol/L 28 25 24   Calcium 8.9 - 10.3 mg/dL 9.2 8.3(L) 8.3(L)  Total Protein 6.5 - 8.1 g/dL 7.0 6.3(L) 6.2(L)  Total Bilirubin 0.3 - 1.2 mg/dL 0.4 0.5 0.5  Alkaline Phos 38 - 126 U/L 62 67 68  AST 15 - 41 U/L 19 14(L) 17  ALT 0 - 44  U/L 17 13 15      RADIOGRAPHIC STUDIES: I have personally reviewed the radiological images as listed and agreed with the findings in the report: bilateral pulmonary emboli.  CT ANGIO HEAD W OR WO CONTRAST  Result Date: 02/22/2019 CLINICAL DATA:  Right lower extremity sudden weakness, on heparin drip; stroke/TIA, assess intracranial arteries. EXAM: CT  ANGIOGRAPHY HEAD AND NECK TECHNIQUE: Multidetector CT imaging of the head and neck was performed using the standard protocol during bolus administration of intravenous contrast. Multiplanar CT image reconstructions and MIPs were obtained to evaluate the vascular anatomy. Carotid stenosis measurements (when applicable) are obtained utilizing NASCET criteria, using the distal internal carotid diameter as the denominator. CONTRAST:  90m OMNIPAQUE IOHEXOL 350 MG/ML SOLN COMPARISON:  Report from head CT 12/07/2013 (images unavailable) FINDINGS: CT HEAD FINDINGS Brain: Streak artifact from a left-sided cochlear implant obscures portions of the left cerebral hemisphere, limiting evaluation. There is no evidence of acute intracranial hemorrhage or demarcated cortical infarction. No evidence of intracranial mass. No midline shift or extra-axial fluid collection. Mild generalized parenchymal atrophy Vascular: Reported separately. Skull: Normal. Negative for fracture or focal lesion. Sinuses: Minimal mucosal thickening within the left sphenoid sinus. Orbits: Visualized orbits demonstrate no acute abnormality. Other: Sequela of previous left mastoidectomy. No significant mastoid effusion. Review of the MIP images confirms the above findings CTA NECK FINDINGS Aortic arch: Standard branching. Scattered calcified plaque within the visualized aortic arch. No significant innominate or proximal subclavian stenosis. Right carotid system: CCA and ICA patent within the neck without measurable stenosis. Mild mixed plaque within the proximal ICA. Left carotid system: CCA and ICA patent within the neck without measurable stenosis. Mild mixed plaque within the proximal ICA. Vertebral arteries: Left vertebral artery dominant. The vertebral arteries are patent within the neck without stenosis. Skeleton: No acute bony abnormality. Other neck: No neck mass or cervical lymphadenopathy. The thyroid gland is atrophic or surgically absent. Upper  chest: Linear atelectasis and/or scarring within the left lung apex. Partially visualized small left pleural effusion. Review of the MIP images confirms the above findings CTA HEAD FINDINGS Anterior circulation: The intracranial internal carotid arteries are patent with scattered calcified plaque but no significant stenosis. The M1 middle cerebral arteries are patent bilaterally without significant stenosis. No M2 proximal branch occlusion or high-grade proximal stenosis is identified. The anterior cerebral arteries are patent bilaterally without high-grade proximal stenosis. The A1 left ACA is slightly hypoplastic. No intracranial aneurysm is identified. Posterior circulation: The non dominant intracranial right vertebral artery is developmentally diminutive beyond the origin of the right PICA, although patent. The dominant intracranial left vertebral artery is patent without significant stenosis, as is the basilar artery. The posterior cerebral arteries are patent bilaterally without significant proximal stenosis. Posterior communicating arteries are poorly delineated and may be hypoplastic or absent bilaterally. Venous sinuses: Within limitations of contrast timing, no convincing thrombus. Anatomic variants: As described Review of the MIP images confirms the above findings IMPRESSION: CT head: 1. 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 2. Within this limitation, no evidence of acute intracranial abnormality. 3. Mild generalized parenchymal atrophy. CTA neck: The bilateral common carotid, internal carotid and vertebral arteries are patent within the neck without measurable stenosis. Mild atherosclerotic plaque within the visualized aortic arch and carotid systems. CTA head: 1. No intracranial large vessel occlusion or proximal high-grade arterial stenosis. 2. Mild calcified plaque within the intracranial internal carotid arteries  without stenosis. Electronically Signed   By:  Kellie Simmering DO   On: 02/22/2019 17:59   DG Chest 2 View  Result Date: 03/23/2019 CLINICAL DATA:  Post COVID-19 pneumonia. EXAM: CHEST - 2 VIEW COMPARISON:  02/27/2019 FINDINGS: There is no pneumothorax or large pleural effusion. There are few probable calcified granulomas involving the right upper lung zone. There is some probable fluid along the minor fissure on the right. The heart size is stable from prior study. There are aortic calcifications. No acute osseous abnormality. IMPRESSION: No active cardiopulmonary disease. Electronically Signed   By: Constance Holster M.D.   On: 03/23/2019 15:12   CT Head Wo Contrast  Result Date: 02/27/2019 CLINICAL DATA:  Dizziness, headache EXAM: CT HEAD WITHOUT CONTRAST TECHNIQUE: Contiguous axial images were obtained from the base of the skull through the vertex without intravenous contrast. COMPARISON:  02/24/2019 FINDINGS: Brain: No evidence of acute infarction, hemorrhage, hydrocephalus, extra-axial collection or mass lesion/mass effect. Streak artifact from the patient's cochlear implant obscures evaluation, particularly involving the left occipital lobe. Mild subcortical white matter and periventricular small vessel ischemic changes. Vascular: Mild intracranial atherosclerosis. Skull: Normal. Negative for fracture or focal lesion. Sinuses/Orbits: The visualized paranasal sinuses are essentially clear. Left mastoidectomy. Other: None. IMPRESSION: No evidence of acute intracranial abnormality. Stable left cochlear implant with associated streak artifact. Electronically Signed   By: Julian Hy M.D.   On: 02/27/2019 12:32   CT HEAD WO CONTRAST  Result Date: 02/24/2019 CLINICAL DATA:  Stroke follow-up. Right-sided weakness and decreased sensation. EXAM: CT HEAD WITHOUT CONTRAST TECHNIQUE: Contiguous axial images were obtained from the base of the skull through the vertex without intravenous contrast.  COMPARISON:  Two days ago FINDINGS: Brain: There is indistinct anterior right insula, likely artifact from the patient's cochlear implant as this does not correlate with the reported symptoms. No identified infarct, hemorrhage, hydrocephalus, or masslike finding. Vascular: No hyperdense vessel. Skull: Negative Sinuses/Orbits: Unremarkable left cochlear implant. IMPRESSION: Stable head CT.  No visible infarct. Electronically Signed   By: Monte Fantasia M.D.   On: 02/24/2019 09:42   CT ANGIO NECK W OR WO CONTRAST  Result Date: 02/22/2019 CLINICAL DATA:  Right lower extremity sudden weakness, on heparin drip; stroke/TIA, assess intracranial arteries. EXAM: CT ANGIOGRAPHY HEAD AND NECK TECHNIQUE: Multidetector CT imaging of the head and neck was performed using the standard protocol during bolus administration of intravenous contrast. Multiplanar CT image reconstructions and MIPs were obtained to evaluate the vascular anatomy. Carotid stenosis measurements (when applicable) are obtained utilizing NASCET criteria, using the distal internal carotid diameter as the denominator. CONTRAST:  48m OMNIPAQUE IOHEXOL 350 MG/ML SOLN COMPARISON:  Report from head CT 12/07/2013 (images unavailable) FINDINGS: CT HEAD FINDINGS Brain: Streak artifact from a left-sided cochlear implant obscures portions of the left cerebral hemisphere, limiting evaluation. There is no evidence of acute intracranial hemorrhage or demarcated cortical infarction. No evidence of intracranial mass. No midline shift or extra-axial fluid collection. Mild generalized parenchymal atrophy Vascular: Reported separately. Skull: Normal. Negative for fracture or focal lesion. Sinuses: Minimal mucosal thickening within the left sphenoid sinus. Orbits: Visualized orbits demonstrate no acute abnormality. Other: Sequela of previous left mastoidectomy. No significant mastoid effusion. Review of the MIP images confirms the above findings CTA NECK FINDINGS Aortic  arch: Standard branching. Scattered calcified plaque within the visualized aortic arch. No significant innominate or proximal subclavian stenosis. Right carotid system: CCA and ICA patent within the neck without measurable stenosis. Mild mixed plaque within the proximal ICA. Left carotid system: CCA and ICA patent within the  neck without measurable stenosis. Mild mixed plaque within the proximal ICA. Vertebral arteries: Left vertebral artery dominant. The vertebral arteries are patent within the neck without stenosis. Skeleton: No acute bony abnormality. Other neck: No neck mass or cervical lymphadenopathy. The thyroid gland is atrophic or surgically absent. Upper chest: Linear atelectasis and/or scarring within the left lung apex. Partially visualized small left pleural effusion. Review of the MIP images confirms the above findings CTA HEAD FINDINGS Anterior circulation: The intracranial internal carotid arteries are patent with scattered calcified plaque but no significant stenosis. The M1 middle cerebral arteries are patent bilaterally without significant stenosis. No M2 proximal branch occlusion or high-grade proximal stenosis is identified. The anterior cerebral arteries are patent bilaterally without high-grade proximal stenosis. The A1 left ACA is slightly hypoplastic. No intracranial aneurysm is identified. Posterior circulation: The non dominant intracranial right vertebral artery is developmentally diminutive beyond the origin of the right PICA, although patent. The dominant intracranial left vertebral artery is patent without significant stenosis, as is the basilar artery. The posterior cerebral arteries are patent bilaterally without significant proximal stenosis. Posterior communicating arteries are poorly delineated and may be hypoplastic or absent bilaterally. Venous sinuses: Within limitations of contrast timing, no convincing thrombus. Anatomic variants: As described Review of the MIP images  confirms the above findings IMPRESSION: CT head: 1. 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 2. Within this limitation, no evidence of acute intracranial abnormality. 3. Mild generalized parenchymal atrophy. CTA neck: The bilateral common carotid, internal carotid and vertebral arteries are patent within the neck without measurable stenosis. Mild atherosclerotic plaque within the visualized aortic arch and carotid systems. CTA head: 1. No intracranial large vessel occlusion or proximal high-grade arterial stenosis. 2. Mild calcified plaque within the intracranial internal carotid arteries without stenosis. Electronically Signed   By: Kellie Simmering DO   On: 02/22/2019 17:59   DG Chest Port 1 View  Result Date: 02/27/2019 CLINICAL DATA:  Dizziness and left-sided headache with nausea and diarrhea. COVID-19 positive. EXAM: PORTABLE CHEST 1 VIEW COMPARISON:  08/03/2017 FINDINGS: Lungs are adequately inflated without focal airspace consolidation or effusion. Cardiomediastinal silhouette is normal. Remaining bones and soft tissues are unremarkable. IMPRESSION: No active disease. Electronically Signed   By: Marin Olp M.D.   On: 02/27/2019 10:34    ASSESSMENT & PLAN Fonnie L Zacarias 64 y.o. female with medical history significant for ovarian cancer (age 45, s/p TAH/BSO), and Hodgkin disease (1987) who presents for a follow up visit. After an extensive MPN and lymphoma workup she has been found to have no evidence of a hematological abnormality. The cause of her night sweats is not clear at this time, however I have a strong suspicion it is medication related (possible hypoglycemia?). Alternatively hyperthyroidism can cause sweating, but usually not paroxysmal.   The etiology of her leukocytosis and thrombocytosis is likely her splenectomy. This is supported by the elevation in all WBC types. The low iron (without microcytic anemia)  may be driving up the platelet count higher than her elevated baseline. Her inflammatory markers are normal which is reassuring for no cancer/infection.   We will plan to have her return to clinic in August 2021 (about 6 months from the diagnosis of PE) in order to determine if anticoagulation can be d/c.  Although data is new an upcoming, there appears to be a correlation between the Covid infection and VTE.  As such I would be cautiously willing to classify this  as a provoked pulmonary embolism.  As such I think we can get to a short-term course of anticoagulation and reassess to determine if total cessation will be appropriate.  #Leukocytosis/Thrombocytosis, likely 2/2 to splenectomy --CT C/A/P showed no evidence of recurrent malignancy.   -- MPN workup for JAK2, CALR, MPL, and BCR/ABL were negative. No indication for a bone marrow biopsy at this time.  --CRP, ESR and ferritin show no evidence of inflammation that would imply infection.  --iron studies reveal a mild iron deficiency (with no anemia or microcytosis). This may be a driving factor in the thrombocytosis, so we started PO iron therapy -- cancer screenings are up to date with colonoscopy (last this year) and mammography (last May 2020).  --RTC 6  months to reassess counts  #Night Sweats, stable --workup for hematological malignancy as the etiology of her sweats was performed, with no clear etiology uncovered --hypoglycemic agents are a possible culprit for these findings, with drops in nighttime sugar resulting in sweats. --none of her medications have sweats a clear side effect, other than her hypoglycemic agents --the patient note she "runs high" on thyroid medication (evidenced by low TSH). Hyperthyroidism is another possible etiology.   #Provoked Pulmonary Embolism #CVA --findings are most consistent with thrombosis from COVID infection --data is limited at this time, though there does appear to be a correlation between COVID  infection and VTE. Therefore I think it would be reasonable to classify the VTE as provoked --continue Abixaban therapy for at least 6 months duration. Will reassess prior to d/c in August 2021 --hold ASA while on full strength anticoagulation.   #Hodgkin Lymphoma, in remission --records from [facility] not in our system --patient treated with chemotherapy and radiation --unlikely recurrence, but considered as noted above  All questions were answered. The patient knows to call the clinic with any problems, questions or concerns.  A total of more than 40 minutes were spent face-to-face with the patient during this encounter and over half of that time was spent on counseling and coordination of care as outlined above.   Ledell Peoples, MD Department of Hematology/Oncology [place] [facility] Phone: [phone] Pager: [phone] Email: [email or web address]  03/23/2019 7:06 PM

## 2019-03-23 ENCOUNTER — Other Ambulatory Visit: Payer: Self-pay

## 2019-03-23 ENCOUNTER — Encounter: Payer: Self-pay | Admitting: Hematology and Oncology

## 2019-03-23 ENCOUNTER — Ambulatory Visit (HOSPITAL_COMMUNITY)
Admission: RE | Admit: 2019-03-23 | Discharge: 2019-03-23 | Disposition: A | Discharge status: Home / Self Care | Payer: Commercial Managed Care - PPO | Source: Ambulatory Visit | Attending: Hematology and Oncology | Admitting: Hematology and Oncology

## 2019-03-23 ENCOUNTER — Inpatient Hospital Stay: Payer: Commercial Managed Care - PPO

## 2019-03-23 ENCOUNTER — Inpatient Hospital Stay: Payer: Commercial Managed Care - PPO | Attending: Hematology and Oncology | Admitting: Hematology and Oncology

## 2019-03-23 VITALS — BP 110/55 | HR 88 | Temp 98.5°F | Resp 18 | Ht 66.0 in | Wt 183.8 lb

## 2019-03-23 DIAGNOSIS — Z9221 Personal history of antineoplastic chemotherapy: Secondary | ICD-10-CM | POA: Insufficient documentation

## 2019-03-23 DIAGNOSIS — E119 Type 2 diabetes mellitus without complications: Secondary | ICD-10-CM | POA: Insufficient documentation

## 2019-03-23 DIAGNOSIS — Z86711 Personal history of pulmonary embolism: Secondary | ICD-10-CM | POA: Diagnosis not present

## 2019-03-23 DIAGNOSIS — Z7901 Long term (current) use of anticoagulants: Secondary | ICD-10-CM | POA: Insufficient documentation

## 2019-03-23 DIAGNOSIS — Z7984 Long term (current) use of oral hypoglycemic drugs: Secondary | ICD-10-CM | POA: Insufficient documentation

## 2019-03-23 DIAGNOSIS — U071 COVID-19: Secondary | ICD-10-CM

## 2019-03-23 DIAGNOSIS — Z85828 Personal history of other malignant neoplasm of skin: Secondary | ICD-10-CM | POA: Insufficient documentation

## 2019-03-23 DIAGNOSIS — C8190 Hodgkin lymphoma, unspecified, unspecified site: Secondary | ICD-10-CM | POA: Insufficient documentation

## 2019-03-23 DIAGNOSIS — Z8541 Personal history of malignant neoplasm of cervix uteri: Secondary | ICD-10-CM | POA: Insufficient documentation

## 2019-03-23 DIAGNOSIS — D72829 Elevated white blood cell count, unspecified: Secondary | ICD-10-CM | POA: Insufficient documentation

## 2019-03-23 DIAGNOSIS — Z90722 Acquired absence of ovaries, bilateral: Secondary | ICD-10-CM | POA: Diagnosis not present

## 2019-03-23 DIAGNOSIS — Z8673 Personal history of transient ischemic attack (TIA), and cerebral infarction without residual deficits: Secondary | ICD-10-CM | POA: Diagnosis not present

## 2019-03-23 DIAGNOSIS — Z8616 Personal history of COVID-19: Secondary | ICD-10-CM | POA: Insufficient documentation

## 2019-03-23 DIAGNOSIS — D473 Essential (hemorrhagic) thrombocythemia: Secondary | ICD-10-CM

## 2019-03-23 DIAGNOSIS — Z79899 Other long term (current) drug therapy: Secondary | ICD-10-CM | POA: Insufficient documentation

## 2019-03-23 DIAGNOSIS — Z923 Personal history of irradiation: Secondary | ICD-10-CM | POA: Diagnosis not present

## 2019-03-23 DIAGNOSIS — E079 Disorder of thyroid, unspecified: Secondary | ICD-10-CM | POA: Insufficient documentation

## 2019-03-23 DIAGNOSIS — D75839 Thrombocytosis, unspecified: Secondary | ICD-10-CM

## 2019-03-23 LAB — CMP (CANCER CENTER ONLY)
ALT: 17 U/L (ref 0–44)
AST: 19 U/L (ref 15–41)
Albumin: 3.9 g/dL (ref 3.5–5.0)
Alkaline Phosphatase: 62 U/L (ref 38–126)
Anion gap: 9 (ref 5–15)
BUN: 12 mg/dL (ref 8–23)
CO2: 28 mmol/L (ref 22–32)
Calcium: 9.2 mg/dL (ref 8.9–10.3)
Chloride: 105 mmol/L (ref 98–111)
Creatinine: 0.67 mg/dL (ref 0.44–1.00)
GFR, Est AFR Am: >60 mL/min (ref >60)
GFR, Estimated: >60 mL/min (ref >60)
Glucose, Bld: 130 mg/dL — ABNORMAL HIGH (ref 70–99)
Potassium: 3.9 mmol/L (ref 3.5–5.1)
Sodium: 142 mmol/L (ref 135–145)
Total Bilirubin: 0.4 mg/dL (ref 0.3–1.2)
Total Protein: 7 g/dL (ref 6.5–8.1)

## 2019-03-23 LAB — CBC WITH DIFFERENTIAL (CANCER CENTER ONLY)
Abs Immature Granulocytes: 0.03 10*3/uL (ref 0.00–0.07)
Basophils Absolute: 0.2 10*3/uL — ABNORMAL HIGH (ref 0.0–0.1)
Basophils Relative: 1 %
Eosinophils Absolute: 0.3 10*3/uL (ref 0.0–0.5)
Eosinophils Relative: 2 %
HCT: 39.1 % (ref 36.0–46.0)
Hemoglobin: 12.8 g/dL (ref 12.0–15.0)
Immature Granulocytes: 0 %
Lymphocytes Relative: 34 %
Lymphs Abs: 4.2 10*3/uL — ABNORMAL HIGH (ref 0.7–4.0)
MCH: 30.3 pg (ref 26.0–34.0)
MCHC: 32.7 g/dL (ref 30.0–36.0)
MCV: 92.4 fL (ref 80.0–100.0)
Monocytes Absolute: 1.1 10*3/uL — ABNORMAL HIGH (ref 0.1–1.0)
Monocytes Relative: 8 %
Neutro Abs: 6.8 10*3/uL (ref 1.7–7.7)
Neutrophils Relative %: 55 %
Platelet Count: 499 10*3/uL — ABNORMAL HIGH (ref 150–400)
RBC: 4.23 MIL/uL (ref 3.87–5.11)
RDW: 13.7 % (ref 11.5–15.5)
WBC Count: 12.6 10*3/uL — ABNORMAL HIGH (ref 4.0–10.5)
nRBC: 0 % (ref 0.0–0.2)

## 2019-03-23 LAB — SAVE SMEAR(SSMR), FOR PROVIDER SLIDE REVIEW

## 2019-03-23 LAB — LACTATE DEHYDROGENASE: LDH: 152 U/L (ref 98–192)

## 2019-03-24 ENCOUNTER — Telehealth: Payer: Self-pay | Admitting: Hematology and Oncology

## 2019-03-24 ENCOUNTER — Ambulatory Visit: Payer: Commercial Managed Care - PPO | Admitting: Physical Therapy

## 2019-03-24 ENCOUNTER — Telehealth: Payer: Self-pay | Admitting: *Deleted

## 2019-03-24 NOTE — Telephone Encounter (Signed)
Scheduled appts per los. Called and left msg. Mailed printout  

## 2019-03-24 NOTE — Telephone Encounter (Signed)
TCT patient regarding results from recent Chest Xray.  Spoke with pt's husband.  Jeanne Sims cannot hear on the phone due to cochlear implants. Advised that recent chest xray was normal-no evidence of pneumonia or active disease/pleural effusion etc.  Her husband voiced understanding. No further questions or concerns at this time.

## 2019-03-24 NOTE — Telephone Encounter (Signed)
-----   Message from Orson Slick, MD sent at 03/24/2019  8:31 AM EDT ----- Please call Mrs. Jonelle Sidle to let her know her chest X-ray looked normal, with no evidence of pneumonia or residual damage to the lungs.  Colan Neptune  ----- Message ----- From: Buel Ream, Rad Results In Sent: 03/23/2019   3:14 PM EDT To: Orson Slick, MD

## 2019-03-30 ENCOUNTER — Ambulatory Visit: Payer: Commercial Managed Care - PPO | Admitting: Physical Therapy

## 2019-03-30 ENCOUNTER — Encounter: Payer: Self-pay | Admitting: Physical Therapy

## 2019-03-30 ENCOUNTER — Other Ambulatory Visit: Payer: Self-pay

## 2019-03-30 DIAGNOSIS — I69351 Hemiplegia and hemiparesis following cerebral infarction affecting right dominant side: Secondary | ICD-10-CM

## 2019-03-30 DIAGNOSIS — R2689 Other abnormalities of gait and mobility: Secondary | ICD-10-CM

## 2019-03-30 DIAGNOSIS — R531 Weakness: Secondary | ICD-10-CM

## 2019-03-30 DIAGNOSIS — R2681 Unsteadiness on feet: Secondary | ICD-10-CM

## 2019-03-30 DIAGNOSIS — M6281 Muscle weakness (generalized): Secondary | ICD-10-CM | POA: Diagnosis not present

## 2019-03-30 NOTE — Therapy (Signed)
[facility] [address] [place], [place], [address] Phone: [phone]   Fax:  [phone]  Physical Therapy Treatment  Patient Details  Name: Jeanne Sims MRN: [id number] Date of Birth: 1955/04/19 Referring Provider (PT): Donnetta Hutching, MD   Encounter Date: 03/30/2019  PT End of Session - 03/30/19 1851    Visit Number  3    Number of Visits  17    Date for PT Re-Evaluation  05/10/19   60 day POC   Authorization Type  UMR/UHC    Authorization - Visit Number  3    Authorization - Number of Visits  30    PT Start Time  1517    PT Stop Time  1618    PT Time Calculation (min)  40 min    Equipment Utilized During Treatment  Gait belt    Activity Tolerance  Patient tolerated treatment well;Patient limited by fatigue    Behavior During Therapy  WFL for tasks assessed/performed       Past Medical History:  Diagnosis Date  . Acquired deafness   . Cervical cancer ([place]) 1075  . Cervical cancer ([place])   . Diabetes mellitus without complication ([place])   . Dyspareunia   . Heart murmur   . Hodgkin disease ([place]) 1987  . Ovarian cancer [facility])    age 64  . Thyroid disease     Past Surgical History:  Procedure Laterality Date  . ABDOMINAL HYSTERECTOMY     TAH/BSO at age 56 d/t cervical CA  . BASAL CELL CARCINOMA EXCISION     face  . BOWEL RESECTION     followed splenectomy.  had bowel obstruction.  . COCHLEAR IMPLANT  1983   Dr. Thornell Mule  . SPLENECTOMY      There were no vitals filed for this visit.  Subjective Assessment - 03/30/19 1530    Subjective  She is having a tough day with breathing.    Patient is accompained by:  Family member    Limitations  Walking;Standing;House hold activities                    Resting HR 88 SpO2 97% During standing balance activities HR 95-99 SpO2 98-99% After gait HR 102 SpO2 99%    [facility] Adult PT Treatment/Exercise - 03/30/19 1535      Ambulation/Gait   Ambulation/Gait  Yes    Ambulation/Gait Assistance  4: Min guard    Ambulation/Gait Assistance Details  tactile & verbal cues on humming while ambulating to facilitate equal stance duration and balance reactions.  Standing rests for 30 seconds 2x during 500' gait to car at end of session.     Ambulation Distance (Feet)  500 Feet   2 standing rests for 30 sec   Assistive device  None    Gait Pattern  Step-through pattern;Decreased arm swing - right;Decreased stance time - right;Decreased weight shift to right;Antalgic    Ambulation Surface  Indoor;Level;Outdoor;Paved    Ramp  4: Min assist   min guard on curb cuts   Ramp Details (indicate cue type and reason)  tactile & verbal cues on balance reactions          Balance Exercises - 03/30/19 1535      Balance Exercises: Standing   Standing Eyes Opened  Wide (BOA);Foam/compliant surface;5 reps   crossways on foam beam   Standing Eyes Opened Limitations  head turns right/left, up/down & diagonals with PT tactile cues for balance  Stepping Strategy  Anterior;Posterior;Foam/compliant surface;5 reps   anticipatory alt stepping off beam & stabilization   Stepping Strategy Limitations  tactile, verbal & visual (mirror) for stabilization    Rockerboard  Anterior/posterior;Lateral;10 reps;Intermittent UE support    Rockerboard Limitations  tactile, verbal & visual (mirror) cues for wt shift/stabilization/recovery    Other Standing Exercises  alternate LE kicks forward 10 reps, abduction 10 reps & extension 10 reps  with tactile, verbal & visual (mirror) balance reactions.           PT Short Term Goals - 03/11/19 1349      PT SHORT TERM GOAL #1   Title  Patient will be independent with initial HEP in order to indicate decreased fall risk and improved functional mobility. (ALL STGs due 04/10/19)    Time  4    Period  Weeks    Status  New      PT SHORT TERM GOAL #2   Title  Patient's gait velocity with LRAD will be >/= 1.52 ft/s in order  to demonstrate a decreased risk for falling.    Baseline  1.22 ft/s with no AD, but close S - min A required from PT    Time  4    Period  Weeks    Status  New      PT SHORT TERM GOAL #3   Title  Patient will score < 45 seconds on the 5 time sit to stand test in order to demonstrate improvement in her functional lower extremity strength and a decrease in risk of falling.    Baseline  61.78 seconds    Time  4    Period  Weeks    Status  New      PT SHORT TERM GOAL #4   Title  Patient will score >/= 24/56 on the Berg Balance Test in order to demonstrate a decrease in her risk of falling.    Baseline  18/56    Time  4    Period  Weeks    Status  New      PT SHORT TERM GOAL #5   Title  The patient will demonstrate ability to ambulate >/= 150 feet with LRAD and supervision to indicate improvement in safety with household ambulation.    Baseline  no AD with close S - min A required for safety    Time  4    Period  Weeks    Status  New        PT Long Term Goals - 03/11/19 1346      PT LONG TERM GOAL #1   Title  Patient will be independent with initial HEP in order to indicate decreased fall risk and improved functional mobility. (All LTGs due 05/10/19    Time  8    Period  Weeks    Target Date  05/10/19      PT LONG TERM GOAL #2   Title  Patient's gait velocity with LRAD will be >/= 1.8 ft/s in order to demonstrate a decreased risk for falling.    Time  8    Period  Weeks    Status  New      PT LONG TERM GOAL #3   Title  Patient will score </= 18 seconds on the 5 time sit to stand test in order to demonstrate improvement in her functional lower extremity strength and decrease in risk of falls.    Time  8    Period  Weeks  Status  New      PT LONG TERM GOAL #4   Title  Patient will score >45/56 on the berg balance test in order to demonstrate a decrease in fall risk.    Time  8    Period  Weeks    Status  New      PT LONG TERM GOAL #5   Title  Patient will demonstrate  ability to ambulate >/= 500 feet with LRAD and mod I on indoor and outdoor surfaces to indicate safety with return to full community ambulation.    Time  8    Period  Weeks    Status  New      Additional Long Term Goals   Additional Long Term Goals  Yes            Plan - 03/30/19 2201    Clinical Impression Statement  PT session focused on standing balance activities that facilitated ankle, hip & step strategies and RLE strength.  She required frequent rest breaks due to dyspnea but would continue after seated break.    Personal Factors and Comorbidities  Comorbidity 3+    Comorbidities  COVID positive 01/29/19 wiht subsequent CVA with R sided weakness 02/22/19 and PE, DM type 2, HLD, hx of Hodgkin's lymphoma and cervical/ovarian cancer (in remission)    Examination-Activity Limitations  Transfers;Bend;Lift;Squat;Locomotion Level;Stairs;Stand    Examination-Participation Restrictions  Cleaning;Laundry;Community Activity;Shop;Driving;Meal Prep    Stability/Clinical Decision Making  Evolving/Moderate complexity    Rehab Potential  Excellent    PT Frequency  2x / week    PT Duration  8 weeks    PT Treatment/Interventions  ADLs/Self Care Home Management;Cryotherapy;Electrical Stimulation;Moist Heat;DME Instruction;Gait training;Therapeutic exercise;Therapeutic activities;Functional mobility training;Stair training;Balance training;Neuromuscular re-education;Cognitive remediation;Patient/family education;Orthotic Fit/Training;Manual techniques;Passive range of motion;Energy conservation    PT Next Visit Plan  balance / neuromuscular reeducation, gait training including outdoor surfaces    Consulted and Agree with Plan of Care  Patient;Family member/caregiver    Family Member Consulted  husband - [name]       Patient will benefit from skilled therapeutic intervention in order to improve the following deficits and impairments:  Abnormal gait, Decreased balance, Decreased endurance, Decreased  mobility, Difficulty walking, Impaired sensation, Cardiopulmonary status limiting activity, Decreased knowledge of precautions, Decreased range of motion, Dizziness, Improper body mechanics, Decreased activity tolerance, Decreased coordination, Decreased knowledge of use of DME, Decreased safety awareness, Decreased strength, Impaired flexibility, Postural dysfunction, Decreased cognition  Visit Diagnosis: Other abnormalities of gait and mobility  Unsteadiness on feet  Weakness generalized  Hemiplegia and hemiparesis following cerebral infarction affecting right dominant side [facility])     Problem List Patient Active Problem List   Diagnosis Date Noted  . C. difficile diarrhea 02/27/2019  . CVA (cerebrovascular accident) ([place]) 02/27/2019  . Cerebrovascular accident (CVA) ([place])   . HLD (hyperlipidemia) 02/21/2019  . Controlled type 2 diabetes mellitus with hyperglycemia ([place]) 02/21/2019  . Hypothyroidism 02/21/2019  . Acute pulmonary embolism ([place]) 02/21/2019  . COVID-19 virus infection 02/21/2019  . PE (physical exam), annual 02/20/2019  . Acute pulmonary embolism without acute cor pulmonale ([place]) 02/20/2019    Jamey Reas PT, DPT 03/30/2019, 10:07 PM  [facility] [address] [place], [place], [address] Phone: [phone]   Fax:  [phone]  Name: Jeanne Sims MRN: [id number] Date of Birth: 1955-08-14

## 2019-03-31 ENCOUNTER — Ambulatory Visit: Payer: Commercial Managed Care - PPO | Attending: Internal Medicine

## 2019-03-31 DIAGNOSIS — Z23 Encounter for immunization: Secondary | ICD-10-CM

## 2019-03-31 NOTE — Progress Notes (Signed)
   Covid-19 Vaccination Clinic  Name:  Jeanne Sims    MRN: [id number] DOB: Feb 20, 1955  03/31/2019  Jeanne Sims was observed post Covid-19 immunization for 15 minutes without incident. She was provided with Vaccine Information Sheet and instruction to access the V-Safe system.   Jeanne Sims was instructed to call 911 with any severe reactions post vaccine: [name] Difficulty breathing  . Swelling of face and throat  . A fast heartbeat  . A bad rash all over body  . Dizziness and weakness   Immunizations Administered    Name Date Dose VIS Date Route   Pfizer COVID-19 Vaccine 03/31/2019 11:17 AM 0.3 mL 12/17/2018 Intramuscular   Manufacturer: [place]   Lot: [id number]   [place]: [address]-1

## 2019-04-01 ENCOUNTER — Other Ambulatory Visit: Payer: Self-pay

## 2019-04-01 ENCOUNTER — Encounter: Payer: Self-pay | Admitting: Physical Therapy

## 2019-04-01 ENCOUNTER — Ambulatory Visit: Payer: Commercial Managed Care - PPO | Admitting: Physical Therapy

## 2019-04-01 DIAGNOSIS — R2689 Other abnormalities of gait and mobility: Secondary | ICD-10-CM

## 2019-04-01 DIAGNOSIS — R531 Weakness: Secondary | ICD-10-CM

## 2019-04-01 DIAGNOSIS — I69351 Hemiplegia and hemiparesis following cerebral infarction affecting right dominant side: Secondary | ICD-10-CM

## 2019-04-01 DIAGNOSIS — M6281 Muscle weakness (generalized): Secondary | ICD-10-CM | POA: Diagnosis not present

## 2019-04-01 DIAGNOSIS — R2681 Unsteadiness on feet: Secondary | ICD-10-CM

## 2019-04-01 NOTE — Therapy (Signed)
[facility] [address] [place], [place], [address] Phone: [phone]   Fax:  [phone]  Physical Therapy Treatment  Patient Details  Name: Jeanne Sims MRN: [id number] Date of Birth: Sep 18, 1955 Referring Provider (PT): Donnetta Hutching, MD   Encounter Date: 04/01/2019  PT End of Session - 04/01/19 1449    Visit Number  4    Number of Visits  17    Date for PT Re-Evaluation  05/10/19   60 day POC   Authorization Type  UMR/UHC    Authorization - Visit Number  4    Authorization - Number of Visits  30    PT Start Time  9485    PT Stop Time  1530    PT Time Calculation (min)  45 min    Equipment Utilized During Treatment  Gait belt    Activity Tolerance  Patient tolerated treatment well;Patient limited by fatigue    Behavior During Therapy  [facility] for tasks assessed/performed       Past Medical History:  Diagnosis Date  . Acquired deafness   . Cervical cancer ([place]) 1075  . Cervical cancer ([place])   . Diabetes mellitus without complication ([place])   . Dyspareunia   . Heart murmur   . Hodgkin disease ([place]) 1987  . Ovarian cancer [facility])    age 46  . Thyroid disease     Past Surgical History:  Procedure Laterality Date  . ABDOMINAL HYSTERECTOMY     TAH/BSO at age 38 d/t cervical CA  . BASAL CELL CARCINOMA EXCISION     face  . BOWEL RESECTION     followed splenectomy.  had bowel obstruction.  . COCHLEAR IMPLANT  1983   Dr. Thornell Mule  . SPLENECTOMY      There were no vitals filed for this visit.  Subjective Assessment - 04/01/19 1448    Subjective  No new complaitns. Had her 1st vacinne yesterday with only fatigue yesterday, better today. No falls.    Patient is accompained by:  Family member   spouse   Limitations  Walking;Standing;House hold activities    How long can you walk comfortably?  Less than 5 mins before having increased fatigue.    Patient Stated Goals  "I wanna get back to the way I was."    Currently in Pain?  No/denies    Pain Score  0-No pain           [facility] Adult PT Treatment/Exercise - 04/01/19 1450      Transfers   Transfers  Sit to Stand;Stand to Sit    Sit to Stand  5: Supervision;With upper extremity assist;Without upper extremity assist;From bed;From chair/3-in-1    Stand to Sit  5: Supervision;With upper extremity assist;Without upper extremity assist      Ambulation/Gait   Ambulation/Gait  Yes    Ambulation/Gait Assistance  4: Min guard;5: Supervision    Ambulation/Gait Assistance Details  95%, HR 90 before. HR 107, SaO2 97% after walk    Ambulation Distance (Feet)  500 Feet   x1, plus around gym with session   Assistive device  None    Gait Pattern  Step-through pattern;Decreased arm swing - right;Decreased stance time - right;Decreased weight shift to right;Antalgic    Ambulation Surface  Level;Unlevel;Indoor;Outdoor;Paved      High Level Balance   High Level Balance Activities  Marching forwards;Marching backwards;Tandem walking   tandem/toe/heel walking fwd/bwd   High Level Balance Comments  blue mat in parallel bars  with no UE support/occasional touch for 3 laps each. min guard to min assist for balance. cues on posture and ex form.           Balance Exercises - 04/01/19 1523      Balance Exercises: Standing   SLS with Vectors  Foam/compliant surface;Intermittent upper extremity assist;Other reps (comment);Limitations    SLS with Vectors Limitations  on balance board in ant/post direction with 2 tall cones in front: alternating fwd foot taps, then cross foot taps for 10 reps each with bil UE support>single UE support>no UE support for last 5-6 reps each. min guard to min assist with cues on posture and weight shifting.     Rockerboard  Anterior/posterior;Lateral;EO;EC;30 seconds;10 reps;Limitations    Rockerboard Limitations  both ways on balance board: rocking with EO, to EC for 8-10 reps each, then holding the board steady for EC for 20 sec's x  2 reps. no UE support with min guard to min assist for balance.     Other Standing Exercises  seated with feet on airex: sit<>stands for 8 reps with arms crossed over chest, cues for full standing and slow, controlled sitting.           PT Short Term Goals - 03/11/19 1349      PT SHORT TERM GOAL #1   Title  Patient will be independent with initial HEP in order to indicate decreased fall risk and improved functional mobility. (ALL STGs due 04/10/19)    Time  4    Period  Weeks    Status  New      PT SHORT TERM GOAL #2   Title  Patient's gait velocity with LRAD will be >/= 1.52 ft/s in order to demonstrate a decreased risk for falling.    Baseline  1.22 ft/s with no AD, but close S - min A required from PT    Time  4    Period  Weeks    Status  New      PT SHORT TERM GOAL #3   Title  Patient will score < 45 seconds on the 5 time sit to stand test in order to demonstrate improvement in her functional lower extremity strength and a decrease in risk of falling.    Baseline  61.78 seconds    Time  4    Period  Weeks    Status  New      PT SHORT TERM GOAL #4   Title  Patient will score >/= 24/56 on the Berg Balance Test in order to demonstrate a decrease in her risk of falling.    Baseline  18/56    Time  4    Period  Weeks    Status  New      PT SHORT TERM GOAL #5   Title  The patient will demonstrate ability to ambulate >/= 150 feet with LRAD and supervision to indicate improvement in safety with household ambulation.    Baseline  no AD with close S - min A required for safety    Time  4    Period  Weeks    Status  New        PT Long Term Goals - 03/11/19 1346      PT LONG TERM GOAL #1   Title  Patient will be independent with initial HEP in order to indicate decreased fall risk and improved functional mobility. (All LTGs due 05/10/19    Time  8    Period  Weeks    Target Date  05/10/19      PT LONG TERM GOAL #2   Title  Patient's gait velocity with LRAD will be >/= 1.8  ft/s in order to demonstrate a decreased risk for falling.    Time  8    Period  Weeks    Status  New      PT LONG TERM GOAL #3   Title  Patient will score </= 18 seconds on the 5 time sit to stand test in order to demonstrate improvement in her functional lower extremity strength and decrease in risk of falls.    Time  8    Period  Weeks    Status  New      PT LONG TERM GOAL #4   Title  Patient will score >45/56 on the berg balance test in order to demonstrate a decrease in fall risk.    Time  8    Period  Weeks    Status  New      PT LONG TERM GOAL #5   Title  Patient will demonstrate ability to ambulate >/= 500 feet with LRAD and mod I on indoor and outdoor surfaces to indicate safety with return to full community ambulation.    Time  8    Period  Weeks    Status  New      Additional Long Term Goals   Additional Long Term Goals  Yes            Plan - 04/01/19 1449    Clinical Impression Statement  Today's skilled session continued to focus on activity tolerance via gait distance outdoors and balance reactions. Short rest breaks needed due to shortness of breath. No rest breaks taken during gait today. Pt with mild increase in dizziness with EC activitites that resolved with rest. The pt is progressing toward goals and should benefit from continued PT to progress toward unmet goals.    Personal Factors and Comorbidities  Comorbidity 3+    Comorbidities  COVID positive 01/29/19 wiht subsequent CVA with R sided weakness 02/22/19 and PE, DM type 2, HLD, hx of Hodgkin's lymphoma and cervical/ovarian cancer (in remission)    Examination-Activity Limitations  Transfers;Bend;Lift;Squat;Locomotion Level;Stairs;Stand    Examination-Participation Restrictions  Cleaning;Laundry;Community Activity;Shop;Driving;Meal Prep    Stability/Clinical Decision Making  Evolving/Moderate complexity    Rehab Potential  Excellent    PT Frequency  2x / week    PT Duration  8 weeks    PT  Treatment/Interventions  ADLs/Self Care Home Management;Cryotherapy;Electrical Stimulation;Moist Heat;DME Instruction;Gait training;Therapeutic exercise;Therapeutic activities;Functional mobility training;Stair training;Balance training;Neuromuscular re-education;Cognitive remediation;Patient/family education;Orthotic Fit/Training;Manual techniques;Passive range of motion;Energy conservation    PT Next Visit Plan  balance / neuromuscular reeducation, gait training including outdoor surfaces    Consulted and Agree with Plan of Care  Patient;Family member/caregiver    Family Member Consulted  husband - [name]       Patient will benefit from skilled therapeutic intervention in order to improve the following deficits and impairments:  Abnormal gait, Decreased balance, Decreased endurance, Decreased mobility, Difficulty walking, Impaired sensation, Cardiopulmonary status limiting activity, Decreased knowledge of precautions, Decreased range of motion, Dizziness, Improper body mechanics, Decreased activity tolerance, Decreased coordination, Decreased knowledge of use of DME, Decreased safety awareness, Decreased strength, Impaired flexibility, Postural dysfunction, Decreased cognition  Visit Diagnosis: Other abnormalities of gait and mobility  Unsteadiness on feet  Weakness generalized  Hemiplegia and hemiparesis following cerebral infarction affecting right dominant side ([place])  Problem List Patient Active Problem List   Diagnosis Date Noted  . C. difficile diarrhea 02/27/2019  . CVA (cerebrovascular accident) ([place]) 02/27/2019  . Cerebrovascular accident (CVA) ([place])   . HLD (hyperlipidemia) 02/21/2019  . Controlled type 2 diabetes mellitus with hyperglycemia ([place]) 02/21/2019  . Hypothyroidism 02/21/2019  . Acute pulmonary embolism ([place]) 02/21/2019  . COVID-19 virus infection 02/21/2019  . PE (physical exam), annual 02/20/2019  . Acute pulmonary embolism without acute cor pulmonale ([facility])  02/20/2019    Jeanne Sims, PTA, [facility] [address] [place], [place] [address] [phone] 04/01/19, 3:37 PM   Name: HANG AMMON MRN: [id number] Date of Birth: [birth year]

## 2019-04-05 ENCOUNTER — Encounter: Payer: Self-pay | Admitting: Physical Therapy

## 2019-04-05 ENCOUNTER — Encounter: Payer: Self-pay | Admitting: Adult Health

## 2019-04-05 ENCOUNTER — Encounter: Payer: Self-pay | Admitting: Neurology

## 2019-04-05 ENCOUNTER — Ambulatory Visit (INDEPENDENT_AMBULATORY_CARE_PROVIDER_SITE_OTHER): Payer: Commercial Managed Care - PPO | Admitting: Adult Health

## 2019-04-05 ENCOUNTER — Other Ambulatory Visit: Payer: Self-pay

## 2019-04-05 ENCOUNTER — Ambulatory Visit: Payer: Commercial Managed Care - PPO | Admitting: Physical Therapy

## 2019-04-05 VITALS — BP 133/65 | HR 82 | Temp 97.6°F | Ht 66.0 in | Wt 181.0 lb

## 2019-04-05 DIAGNOSIS — U071 COVID-19: Secondary | ICD-10-CM

## 2019-04-05 DIAGNOSIS — E1165 Type 2 diabetes mellitus with hyperglycemia: Secondary | ICD-10-CM

## 2019-04-05 DIAGNOSIS — E782 Mixed hyperlipidemia: Secondary | ICD-10-CM

## 2019-04-05 DIAGNOSIS — R531 Weakness: Secondary | ICD-10-CM

## 2019-04-05 DIAGNOSIS — I639 Cerebral infarction, unspecified: Secondary | ICD-10-CM | POA: Diagnosis not present

## 2019-04-05 DIAGNOSIS — R2681 Unsteadiness on feet: Secondary | ICD-10-CM

## 2019-04-05 DIAGNOSIS — R2689 Other abnormalities of gait and mobility: Secondary | ICD-10-CM

## 2019-04-05 DIAGNOSIS — M6281 Muscle weakness (generalized): Secondary | ICD-10-CM | POA: Diagnosis not present

## 2019-04-05 DIAGNOSIS — I10 Essential (primary) hypertension: Secondary | ICD-10-CM

## 2019-04-05 DIAGNOSIS — D6869 Other thrombophilia: Secondary | ICD-10-CM

## 2019-04-05 MED ORDER — GABAPENTIN 300 MG PO CAPS: 300.0000 mg | ORAL_CAPSULE | Freq: Every day | ORAL | 3 refills | Status: DC

## 2019-04-05 NOTE — Progress Notes (Signed)
[facility] [address] [place]. [place] [address] [phone]       HOSPITAL FOLLOW UP NOTE  [name] Date of Birth:  10-Jun-1955 Medical Record Number:  [id number]   Reason for Referral:  hospital stroke follow up    CHIEF COMPLAINT:  Chief Complaint  Patient presents with  . Follow-up    WITH Husband, HOSPITAL FU, RM 9    HPI:  [name] is a 64 y.o. female with history of , cervical cancer, pulmonary embolism, COVID-19 dx 3 weeks ago presented on 02/20/2019, with SOB found to have B PEs. Started on [facility]. In hospital developed altered sensation in R leg and RUE weakness.  Stroke work-up revealed likely small left subcortical infarct secondary to small vessel disease source patient with acute Covid infection and hypercoagulability with PE on Eliquis. All other imaging unremarkable.  Did not recommend further work-up at that time as treatment plan would not change.  History of HTN stable.  LDL 72 with continuation of Crestor.  Uncontrolled DM with A1c 7.2 with close PCP follow-up.  No prior history of stroke.  Discharged home in stable condition with recommendation of home health therapies with residual right-sided weakness.  Stroke:  Likely small subcortical infarct secondary to small vessel disease source in pt w/ acute Covid infection hypercoagulability w/ PE on Eliquis  CT head 2/16 no acute abnormality. Mild atrophy.   CTA head mild plaque IC atherosclerosis   CTA neck mild plaque aortic arch and ICAs  CT head 2/18 no acute infarct  2D Echo EF 60-65%. No source of embolus   RLE dopplers neg  LDL 72  HgbA1c 7.2  Today, 04/05/2019, Jeanne Sims is being seen for hospital follow-up accompanied by her husband.  She does report ongoing recovery with residual mild right hemiparesis and RLE numbness/tingling with increased pain at night.  Continues to work with outpatient therapies.  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  Continues on Eliquis and Crestor for secondary stroke prevention without side effects.  Blood pressure today 133/65.  Glucose levels stable with recent A1c 6.9.  No further concerns at this time.      ROS:   14 system review of systems performed and negative with exception of weakness, gait impairment, numbness tingling and pain  PMH:  Past Medical History:  Diagnosis Date  . Acquired deafness   . Cervical cancer ([place]) 1075  . Cervical cancer ([place])   . Diabetes mellitus without complication ([place])   . Dyspareunia   . Heart murmur   . Hodgkin disease ([place]) 1987  . Ovarian cancer [facility])    age 78  . Thyroid disease     PSH:  Past Surgical History:  Procedure Laterality Date  . ABDOMINAL HYSTERECTOMY     TAH/BSO at age 46 d/t cervical CA  . BASAL CELL CARCINOMA EXCISION     face  . BOWEL RESECTION     followed splenectomy.  had bowel obstruction.  . COCHLEAR IMPLANT  1983   Dr. Thornell Mule  . SPLENECTOMY      Social History:  Social History   Socioeconomic History  . Marital status: Married    Spouse name: Not on file  . Number of children: Not on file  . Years of education: Not on file  . Highest education level: Not on file  Occupational History  . Not on file  Tobacco Use  . Smoking status: Never Smoker  .  Smokeless tobacco: Never Used  Substance and Sexual Activity  . Alcohol use: No  . Drug use: No  . Sexual activity: Never    Comment: TAH/BSO  Other Topics Concern  . Not on file  Social History Narrative  . Not on file   Social Determinants of Health   Financial Resource Strain:   . Difficulty of Paying Living Expenses:   Food Insecurity:   . Worried About Charity fundraiser in the Last Year:   . Arboriculturist in the Last Year:   Transportation Needs:   . Film/video editor (Medical):   [name] Lack of Transportation (Non-Medical):   Physical Activity:   . Days of Exercise per Week:   . Minutes of Exercise per Session:   Stress:   .  Feeling of Stress :   Social Connections:   . Frequency of Communication with Friends and Family:   . Frequency of Social Gatherings with Friends and Family:   . Attends Religious Services:   . Active Member of Clubs or Organizations:   . Attends Archivist Meetings:   [name] Marital Status:   Intimate Partner Violence:   . Fear of Current or Ex-Partner:   . Emotionally Abused:   [name] Physically Abused:   . Sexually Abused:     Family History:  Family History  Problem Relation Age of Onset  . Heart failure Mother   . Diabetes Mother   . Diabetes Brother   . Diabetes Sister   . Cervical cancer Sister   . Diabetes Sister   . Cervical cancer Sister   . Diabetes Sister   . Diabetes Brother   . Breast cancer Paternal Uncle     Medications:   Current Outpatient Medications on File Prior to Visit  Medication Sig Dispense Refill  . Cyanocobalamin 1000 MCG/ML KIT Inject as directed every 30 (thirty) days.    [name] dexlansoprazole (DEXILANT) 60 MG capsule Take 60 mg by mouth daily.    . Dulaglutide (TRULICITY) 1.5 YJ/8.5UD SOPN Inject 1.5 mg into the skin once a week.     . ferrous sulfate 325 (65 FE) MG tablet Take 1 tablet (325 mg total) by mouth daily with breakfast. Please take with a source of vitamin C (orange juice or tablet with 500 units of Vitamin C). 90 tablet 3  . folic acid (FOLVITE) 149 MCG tablet Take 800 mcg by mouth daily.    [name] levothyroxine (EUTHYROX) 175 MCG tablet Take 175 mcg by mouth daily before breakfast.    . metFORMIN (GLUCOPHAGE) 500 MG tablet Take 500 mg by mouth 2 (two) times daily with a meal.    . metoprolol succinate (TOPROL-XL) 25 MG 24 hr tablet Take 12.5 mg by mouth daily.     . rosuvastatin (CRESTOR) 20 MG tablet Take 1 tablet (20 mg total) by mouth daily. 30 tablet 0  . Vitamin D, Ergocalciferol, (DRISDOL) 1.25 MG (50000 UT) CAPS capsule Take 50,000 Units by mouth every 7 (seven) days.    [name] apixaban (ELIQUIS) 5 MG TABS tablet Take 2 tablets (10 mg  total) by mouth 2 (two) times daily for 5 days. LAST DOSE ON 03/01/19-BEFORE SWITCHING TO 5 MG TWICE DAILY ON 2/24 (Patient taking differently: Take 10 mg by mouth 2 (two) times daily. TAKE 2 TABLETS (10 MG TOTALLY) BY MOUTH TWICE DAILY UNTIL 03/29/2019; THEN TAKE 1 TABLET (5 MG TOTALLY) BY MOUTH TWICE DAILY ON 03/02/2019) 60 tablet 0   No current facility-administered medications  on file prior to visit.    Allergies:  No Known Allergies   Physical Exam  Vitals:   04/05/19 1419  BP: 133/65  Pulse: 82  Temp: 97.6 F (36.4 C)  Weight: 181 lb (82.1 kg)  Height: 5' 6"  (1.676 m)   Body mass index is 29.21 kg/m. No exam data present  No flowsheet data found.   General: well developed, well nourished,  pleasant middle-age Caucasian female, seated, in no evident distress Head: head normocephalic and atraumatic.   Neck: supple with no carotid or supraclavicular bruits Cardiovascular: regular rate and rhythm, no murmurs Musculoskeletal: no deformity Skin:  no rash/petichiae Vascular:  Normal pulses all extremities   Neurologic Exam Mental Status: Awake and fully alert.   Normal speech and language.  Oriented to place and time. Recent and remote memory intact. Attention span, concentration and fund of knowledge appropriate. Mood and affect appropriate.  Cranial Nerves: Fundoscopic exam reveals sharp disc margins. Pupils equal, briskly reactive to light. Extraocular movements full without nystagmus. Visual fields full to confrontation. HOH with cochlear implants.  Mild right lower facial weakness. Motor: Normal bulk and tone.  Full strength left upper and lower extremity RUE: 4/5 with mildly weak grip strength and positive pronator drift RLE: 4/5 hip flexor Sensory.:  Decreased vibratory and light touch sensation proximal RLE and distal RUE Coordination: Rapid alternating movements normal in all extremities except mildly decreased right hand. Finger-to-nose and heel-to-shin performed  accurately bilaterally. Gait and Station: Arises from chair without difficulty. Stance is normal.  Abnormal gait due to RLE weakness but able to ambulate without assistive device and mild difficulty with tandem gait Reflexes: 1+ and symmetric. Toes downgoing.     NIHSS 3 1a.  Level of consciousness 0 1b. LOC questions 0 1c. LOC commands 0 2.  Best gaze 0 3.  Visual 0 4.  Facial palsy 1 5a.  Motor arm-left 0 5b.  Motor arm-right 1 6a.  Motor leg-left 0 6b.  Motor leg-right 0 7.  Limb ataxia 0 8.  Sensory 1 9.  Best language 0 10.  Dysarthria 0 11.  Extinction and inattention 0  Modified Rankin  2       ASSESSMENT: Jeanne Sims is a 64 y.o. year old female hospitalized on 02/20/2019 due to shortness of breath in setting of COVID-19 infection and bilateral PEs.  During admission on 02/24/2019, developed altered sensation in right leg and RUE weakness with stroke work-up revealing likely small left subcortical infarct unable to visualize on imaging secondary to small vessel disease. Vascular risk factors include HTN, HLD, DM, bilateral PEs (provoked).  Residual deficits of mild right hemiparesis and hemisensory impairment with paresthesias    PLAN:  1. Left subcortical stroke:  -Right hemiparesis and hemisensory impairment with paresthesias: Ongoing participation in outpatient therapies for likely ongoing improvement.  Initiate gabapentin 300 mg nightly for nerve pain poststroke interfering with sleep.  She was cleared to return to work on a part-time basis for the next 2 to 3 weeks and then if able, return full-time.  Work note provided. -Continue Eliquis (apixaban) daily  and Crestor for secondary stroke prevention.  -Maintain strict control of hypertension with blood pressure goal below 130/90, diabetes with hemoglobin A1c goal below 6.5% and cholesterol with LDL cholesterol (bad cholesterol) goal below 70 mg/dL.  I also advised the patient to eat a healthy diet with plenty of  whole grains, cereals, fruits and vegetables, exercise regularly with at least 30 minutes of continuous activity daily  and maintain ideal body weight. 2. HTN: Stable.  Monitoring management by PCP 3. HLD: Stable.  Monitoring management by PCP 4. DMII: Stable.  Monitoring management by PCP 5. PE in setting of Covid infection -Ongoing use of Eliquis and follow-up with oncology with recommended 86-monthduration.  Recommend restarting aspirin 81 mg daily at completion of anticoagulation therapy    Follow up in 3 months or call earlier if needed  I spent 43 minutes of face-to-face and non-face-to-face time with patient.  This included previsit chart review, lab review, study review, order entry, electronic health record documentation, patient education regarding recent stroke, residual deficits and importance of ongoing stroke risk factor modification   JFrann Rider AGNP-BC  [facility] [address] [address] Phone [phone] [phone]: This document was prepared with digital dictation and possible smart phrase technology. Any transcriptional errors that result from this process are unintentional.

## 2019-04-05 NOTE — Patient Instructions (Addendum)
You are cleared to return to work on a part time basis for 3 weeks starting on 4/4 - a work note will be provided   Start gabapentin 300mg  nightly for nerve pain - continue over the next week and call if needed increase  Continue Eliquis (apixaban) daily  and Crestor for secondary stroke prevention  Ongoing use of Eliquis will be further determined and managed by oncology.  Once treatment completed, recommend initiating aspirin daily for secondary stroke  Continue to follow up with PCP regarding cholesterol, blood pressure and diabetes management   Continue to monitor blood pressure at home  Maintain strict control of hypertension with blood pressure goal below 130/90, diabetes with hemoglobin A1c goal below 6.5% and cholesterol with LDL cholesterol (bad cholesterol) goal below 70 mg/dL. I also advised the patient to eat a healthy diet with plenty of whole grains, cereals, fruits and vegetables, exercise regularly and maintain ideal body weight.  Followup in the future with me in 3 months or call earlier if needed       Thank you for coming to see [place] at [facility]. I hope we have been able to provide you high quality care today.  You may receive a patient satisfaction survey over the next few weeks. We would appreciate your feedback and comments so that we may continue to improve ourselves and the health of our patients.    Gabapentin capsules or tablets What is this medicine? GABAPENTIN (GA ba pen tin) is used to control seizures in certain types of epilepsy. It is also used to treat certain types of nerve pain. This medicine may be used for other purposes; ask your health care provider or pharmacist if you have questions. COMMON BRAND NAME(S): Active-PAC with Gabapentin, Gabarone, Neurontin What should I tell my health care provider before I take this medicine? They need to know if you have any of these conditions:  history of drug abuse or alcohol abuse  problem  kidney disease  lung or breathing disease  suicidal thoughts, plans, or attempt; a previous suicide attempt by you or a family member  an unusual or allergic reaction to gabapentin, other medicines, foods, dyes, or preservatives  pregnant or trying to get pregnant  breast-feeding How should I use this medicine? Take this medicine by mouth with a glass of water. Follow the directions on the prescription label. You can take it with or without food. If it upsets your stomach, take it with food. Take your medicine at regular intervals. Do not take it more often than directed. Do not stop taking except on your doctor's advice. If you are directed to break the 600 or 800 mg tablets in half as part of your dose, the extra half tablet should be used for the next dose. If you have not used the extra half tablet within 28 days, it should be thrown away. A special MedGuide will be given to you by the pharmacist with each prescription and refill. Be sure to read this information carefully each time. Talk to your pediatrician regarding the use of this medicine in children. While this drug may be prescribed for children as young as 3 years for selected conditions, precautions do apply. Overdosage: If you think you have taken too much of this medicine contact a poison control center or emergency room at once. NOTE: This medicine is only for you. Do not share this medicine with others. What if I miss a dose? If you miss a dose, take it as  soon as you can. If it is almost time for your next dose, take only that dose. Do not take double or extra doses. What may interact with this medicine? This medicine may interact with the following medications:  alcohol  antihistamines for allergy, cough, and cold  certain medicines for anxiety or sleep  certain medicines for depression like amitriptyline, fluoxetine, sertraline  certain medicines for seizures like phenobarbital, primidone  certain  medicines for stomach problems  general anesthetics like halothane, isoflurane, methoxyflurane, propofol  local anesthetics like lidocaine, pramoxine, tetracaine  medicines that relax muscles for surgery  narcotic medicines for pain  phenothiazines like chlorpromazine, mesoridazine, prochlorperazine, thioridazine This list may not describe all possible interactions. Give your health care provider a list of all the medicines, herbs, non-prescription drugs, or dietary supplements you use. Also tell them if you smoke, drink alcohol, or use illegal drugs. Some items may interact with your medicine. What should I watch for while using this medicine? Visit your doctor or health care provider for regular checks on your progress. You may want to keep a record at home of how you feel your condition is responding to treatment. You may want to share this information with your doctor or health care provider at each visit. You should contact your doctor or health care provider if your seizures get worse or if you have any new types of seizures. Do not stop taking this medicine or any of your seizure medicines unless instructed by your doctor or health care provider. Stopping your medicine suddenly can increase your seizures or their severity. This medicine may cause serious skin reactions. They can happen weeks to months after starting the medicine. Contact your health care provider right away if you notice fevers or flu-like symptoms with a rash. The rash may be red or purple and then turn into blisters or peeling of the skin. Or, you might notice a red rash with swelling of the face, lips or lymph nodes in your neck or under your arms. Wear a medical identification bracelet or chain if you are taking this medicine for seizures, and carry a card that lists all your medications. You may get drowsy, dizzy, or have blurred vision. Do not drive, use machinery, or do anything that needs mental alertness until you  know how this medicine affects you. To reduce dizzy or fainting spells, do not sit or stand up quickly, especially if you are an older patient. Alcohol can increase drowsiness and dizziness. Avoid alcoholic drinks. Your mouth may get dry. Chewing sugarless gum or sucking hard candy, and drinking plenty of water will help. The use of this medicine may increase the chance of suicidal thoughts or actions. Pay special attention to how you are responding while on this medicine. Any worsening of mood, or thoughts of suicide or dying should be reported to your health care provider right away. Women who become pregnant while using this medicine may enroll in the [place] Pregnancy Registry by calling [phone]. This registry collects information about the safety of antiepileptic drug use during pregnancy. What side effects may I notice from receiving this medicine? Side effects that you should report to your doctor or health care professional as soon as possible:  allergic reactions like skin rash, itching or hives, swelling of the face, lips, or tongue  breathing problems  rash, fever, and swollen lymph nodes  redness, blistering, peeling or loosening of the skin, including inside the mouth  suicidal thoughts, mood changes Side effects  that usually do not require medical attention (report to your doctor or health care professional if they continue or are bothersome):  dizziness  drowsiness  headache  nausea, vomiting  swelling of ankles, feet, hands  tiredness This list may not describe all possible side effects. Call your doctor for medical advice about side effects. You may report side effects to FDA at 1-800-FDA-1088. Where should I keep my medicine? Keep out of reach of children. This medicine may cause accidental overdose and death if it taken by other adults, children, or pets. Mix any unused medicine with a substance like cat litter or coffee grounds.  Then throw the medicine away in a sealed container like a sealed bag or a coffee can with a lid. Do not use the medicine after the expiration date. Store at room temperature between 15 and 30 degrees C (59 and 86 degrees F). NOTE: This sheet is a summary. It may not cover all possible information. If you have questions about this medicine, talk to your doctor, pharmacist, or health care provider.  2020 Elsevier/Gold Standard (2018-03-26 14:16:43)

## 2019-04-06 NOTE — Therapy (Signed)
[facility] [address] [place], [place], [address] Phone: [phone]   Fax:  [phone]  Physical Therapy Treatment  Patient Details  Name: Jeanne Sims MRN: [id number] Date of Birth: [birth year] Referring Provider (PT): Donnetta Hutching, MD   Encounter Date: 04/05/2019  PT End of Session - 04/05/19 1320    Visit Number  5    Number of Visits  17    Date for PT Re-Evaluation  05/10/19   60 day POC   Authorization Type  UMR/UHC    Authorization - Visit Number  5    Authorization - Number of Visits  30    PT Start Time  1324    PT Stop Time  1400    PT Time Calculation (min)  43 min    Equipment Utilized During Treatment  Gait belt    Activity Tolerance  Patient tolerated treatment well;Patient limited by fatigue    Behavior During Therapy  [facility] for tasks assessed/performed       Past Medical History:  Diagnosis Date  . Acquired deafness   . Cervical cancer ([place]) 1075  . Cervical cancer ([place])   . Diabetes mellitus without complication ([place])   . Dyspareunia   . Heart murmur   . Hodgkin disease ([place]) 1987  . Ovarian cancer [facility])    age 64  . Thyroid disease     Past Surgical History:  Procedure Laterality Date  . ABDOMINAL HYSTERECTOMY     TAH/BSO at age 64 d/t cervical CA  . BASAL CELL CARCINOMA EXCISION     face  . BOWEL RESECTION     followed splenectomy.  had bowel obstruction.  . COCHLEAR IMPLANT  1983   Dr. Thornell Mule  . SPLENECTOMY      There were no vitals filed for this visit.  Subjective Assessment - 04/05/19 1319    Subjective  No new complaints. Went home "and crashed" after last session. Go falls or pain today.    Patient is accompained by:  Family member   spouse   Limitations  Walking;Standing;House hold activities    How long can you walk comfortably?  Less than 5 mins before having increased fatigue.    Patient Stated Goals  "I wanna get back to the way I was."    Pain Score  0-No  pain          [facility] Adult PT Treatment/Exercise - 04/05/19 1323      Transfers   Transfers  Sit to Stand;Stand to Sit    Sit to Stand  5: Supervision;With upper extremity assist;Without upper extremity assist;From bed;From chair/3-in-1    Stand to Sit  5: Supervision;With upper extremity assist;Without upper extremity assist      Ambulation/Gait   Ambulation/Gait  Yes    Ambulation/Gait Assistance  5: Supervision    Ambulation/Gait Assistance Details  SaO2 97%, HR86 before; SaO2 98%, HR 108 after. mild shortness of breath after gait. no balance issues noted with slight increase in antalgic gait for last 15-20 feet.     Ambulation Distance (Feet)  500 Feet   x1, plus around gym with session   Assistive device  None    Gait Pattern  Step-through pattern;Decreased arm swing - right;Decreased stance time - right;Decreased weight shift to right;Antalgic    Ambulation Surface  Level;Unlevel;Indoor;Outdoor;Paved      High Level Balance   High Level Balance Activities  Negotiating over obstacles    High Level Balance Comments  on  floor progressing to on red/blue mats next to counter top- hurdles of varied heights- reciprocal forward stepping for 4 laps on each surface, then side stepping for 4 laps on each surface. intermittent touch to counter for balance, min guard to min assist with cues on increased LE lifting to clear surface, not to circumduct;       Neuro Re-ed    Neuro Re-ed Details   for balance training: on airex in corner with chair in front for safety: feet together for EC 30 sec's for 3 reps, then with feet hip width apart for EC head movements left<>right, up<>down for 10 reps each, min guard assist with no UE support. then modified tandem standing with EC for 20 sec's for 2 reps each foot forward. cues on posture and weight shifting to assist with balance, min guard to min assist for balance.            PT Short Term Goals - 03/11/19 1349      PT SHORT TERM GOAL #1    Title  Patient will be independent with initial HEP in order to indicate decreased fall risk and improved functional mobility. (ALL STGs due 04/10/19)    Time  4    Period  Weeks    Status  New      PT SHORT TERM GOAL #2   Title  Patient's gait velocity with LRAD will be >/= 1.52 ft/s in order to demonstrate a decreased risk for falling.    Baseline  1.22 ft/s with no AD, but close S - min A required from PT    Time  4    Period  Weeks    Status  New      PT SHORT TERM GOAL #3   Title  Patient will score < 45 seconds on the 5 time sit to stand test in order to demonstrate improvement in her functional lower extremity strength and a decrease in risk of falling.    Baseline  61.78 seconds    Time  4    Period  Weeks    Status  New      PT SHORT TERM GOAL #4   Title  Patient will score >/= 24/56 on the Berg Balance Test in order to demonstrate a decrease in her risk of falling.    Baseline  18/56    Time  4    Period  Weeks    Status  New      PT SHORT TERM GOAL #5   Title  The patient will demonstrate ability to ambulate >/= 150 feet with LRAD and supervision to indicate improvement in safety with household ambulation.    Baseline  no AD with close S - min A required for safety    Time  4    Period  Weeks    Status  New        PT Long Term Goals - 03/11/19 1346      PT LONG TERM GOAL #1   Title  Patient will be independent with initial HEP in order to indicate decreased fall risk and improved functional mobility. (All LTGs due 05/10/19    Time  8    Period  Weeks    Target Date  05/10/19      PT LONG TERM GOAL #2   Title  Patient's gait velocity with LRAD will be >/= 1.8 ft/s in order to demonstrate a decreased risk for falling.    Time  8  Period  Weeks    Status  New      PT LONG TERM GOAL #3   Title  Patient will score </= 18 seconds on the 5 time sit to stand test in order to demonstrate improvement in her functional lower extremity strength and decrease in risk of  falls.    Time  8    Period  Weeks    Status  New      PT LONG TERM GOAL #4   Title  Patient will score >45/56 on the berg balance test in order to demonstrate a decrease in fall risk.    Time  8    Period  Weeks    Status  New      PT LONG TERM GOAL #5   Title  Patient will demonstrate ability to ambulate >/= 500 feet with LRAD and mod I on indoor and outdoor surfaces to indicate safety with return to full community ambulation.    Time  8    Period  Weeks    Status  New      Additional Long Term Goals   Additional Long Term Goals  Yes        Plan - 04/05/19 1320    Clinical Impression Statement  Today's skilled session continued to focus on activity tolerance and balance reaction training with rest breaks taken due to fatigue. VSS with session. Pt reporting mild shortness of breath after gait that resolved with rest break. Continued to report mild dizziness with corner balance activities that resolved with rest breaks as well. The pt is progressing toward goals and should benefit from continued PT to progress toward unmet goals.    Personal Factors and Comorbidities  Comorbidity 3+    Comorbidities  COVID positive 01/29/19 wiht subsequent CVA with R sided weakness 02/22/19 and PE, DM type 2, HLD, hx of Hodgkin's lymphoma and cervical/ovarian cancer (in remission)    Examination-Activity Limitations  Transfers;Bend;Lift;Squat;Locomotion Level;Stairs;Stand    Examination-Participation Restrictions  Cleaning;Laundry;Community Activity;Shop;Driving;Meal Prep    Stability/Clinical Decision Making  Evolving/Moderate complexity    Rehab Potential  Excellent    PT Frequency  2x / week    PT Duration  8 weeks    PT Treatment/Interventions  ADLs/Self Care Home Management;Cryotherapy;Electrical Stimulation;Moist Heat;DME Instruction;Gait training;Therapeutic exercise;Therapeutic activities;Functional mobility training;Stair training;Balance training;Neuromuscular re-education;Cognitive  remediation;Patient/family education;Orthotic Fit/Training;Manual techniques;Passive range of motion;Energy conservation    PT Next Visit Plan  balance / neuromuscular reeducation, gait training including outdoor surfaces    Consulted and Agree with Plan of Care  Patient;Family member/caregiver    Family Member Consulted  husband - [name]       Patient will benefit from skilled therapeutic intervention in order to improve the following deficits and impairments:  Abnormal gait, Decreased balance, Decreased endurance, Decreased mobility, Difficulty walking, Impaired sensation, Cardiopulmonary status limiting activity, Decreased knowledge of precautions, Decreased range of motion, Dizziness, Improper body mechanics, Decreased activity tolerance, Decreased coordination, Decreased knowledge of use of DME, Decreased safety awareness, Decreased strength, Impaired flexibility, Postural dysfunction, Decreased cognition  Visit Diagnosis: Other abnormalities of gait and mobility  Unsteadiness on feet  Weakness generalized     Problem List Patient Active Problem List   Diagnosis Date Noted  . C. difficile diarrhea 02/27/2019  . CVA (cerebrovascular accident) ([place]) 02/27/2019  . Cerebrovascular accident (CVA) ([place])   . HLD (hyperlipidemia) 02/21/2019  . Controlled type 2 diabetes mellitus with hyperglycemia ([place]) 02/21/2019  . Hypothyroidism 02/21/2019  . Acute pulmonary embolism ([place]) 02/21/2019  .  COVID-19 virus infection 02/21/2019  . PE (physical exam), annual 02/20/2019  . Acute pulmonary embolism without acute cor pulmonale (HCC) 02/20/2019    Willow Ora, PTA, [facility] [address], [place], [place] [address] [phone] 04/06/19, 11:41 AM   Name: Jeanne Sims MRN: [id number] Date of Birth: 1955-10-31

## 2019-04-07 ENCOUNTER — Ambulatory Visit: Payer: Commercial Managed Care - PPO | Admitting: Rehabilitation

## 2019-04-08 NOTE — Progress Notes (Signed)
I agree with the above plan 

## 2019-04-11 ENCOUNTER — Telehealth: Payer: Self-pay | Admitting: Adult Health

## 2019-04-11 ENCOUNTER — Encounter: Payer: Self-pay | Admitting: Adult Health

## 2019-04-11 NOTE — Telephone Encounter (Signed)
Called. VM not set up.  

## 2019-04-11 NOTE — Telephone Encounter (Signed)
Is she taking the medication in the morning or at bedtime?  If taking in the morning, please advised to take at nighttime.  If unable to tolerate dosage at nighttime, possibly trial decreasing dosage.

## 2019-04-11 NOTE — Telephone Encounter (Signed)
Pt husband called to inform pt is having a difficult time on the gabapentin (NEURONTIN) 300 MG capsule It is causing her to feel groggy in the afternoons and would like to know if it is normal for her to feel this way or if they need to make an adjustment on the dose

## 2019-04-12 MED ORDER — GABAPENTIN 100 MG PO CAPS: 100.0000 mg | ORAL_CAPSULE | Freq: Every day | ORAL | 3 refills | Status: DC

## 2019-04-12 NOTE — Telephone Encounter (Signed)
mychart email sent.

## 2019-04-13 ENCOUNTER — Other Ambulatory Visit: Payer: Self-pay

## 2019-04-13 ENCOUNTER — Encounter: Payer: Self-pay | Admitting: Physical Therapy

## 2019-04-13 ENCOUNTER — Ambulatory Visit: Payer: Commercial Managed Care - PPO | Attending: Internal Medicine | Admitting: Physical Therapy

## 2019-04-13 DIAGNOSIS — R2681 Unsteadiness on feet: Secondary | ICD-10-CM

## 2019-04-13 DIAGNOSIS — M6281 Muscle weakness (generalized): Secondary | ICD-10-CM | POA: Diagnosis present

## 2019-04-13 DIAGNOSIS — R531 Weakness: Secondary | ICD-10-CM | POA: Diagnosis present

## 2019-04-13 DIAGNOSIS — R2689 Other abnormalities of gait and mobility: Secondary | ICD-10-CM | POA: Insufficient documentation

## 2019-04-13 DIAGNOSIS — I69351 Hemiplegia and hemiparesis following cerebral infarction affecting right dominant side: Secondary | ICD-10-CM | POA: Diagnosis present

## 2019-04-13 NOTE — Therapy (Signed)
[facility] [address] [place], [place], [address] Phone: [phone]   Fax:  [phone]  Physical Therapy Treatment  Patient Details  Name: Jeanne Sims MRN: [id number] Date of Birth: March 21, 1955 Referring Provider (PT): Donnetta Hutching, MD   Encounter Date: 04/13/2019  PT End of Session - 04/13/19 1407    Visit Number  6    Number of Visits  17    Date for PT Re-Evaluation  05/10/19   60 day POC   Authorization Type  UMR/UHC    Authorization - Visit Number  6    Authorization - Number of Visits  30    PT Start Time  1505    PT Stop Time  6979    PT Time Calculation (min)  43 min    Equipment Utilized During Treatment  Gait belt    Activity Tolerance  Patient tolerated treatment well;No increased pain    Behavior During Therapy  WFL for tasks assessed/performed       Past Medical History:  Diagnosis Date  . Acquired deafness   . Cervical cancer ([place]) 1075  . Cervical cancer ([place])   . Diabetes mellitus without complication ([place])   . Dyspareunia   . Heart murmur   . Hodgkin disease ([place]) 1987  . Ovarian cancer [facility])    age 64  . Thyroid disease     Past Surgical History:  Procedure Laterality Date  . ABDOMINAL HYSTERECTOMY     TAH/BSO at age 52 d/t cervical CA  . BASAL CELL CARCINOMA EXCISION     face  . BOWEL RESECTION     followed splenectomy.  had bowel obstruction.  . COCHLEAR IMPLANT  1983   Dr. Thornell Mule  . SPLENECTOMY      There were no vitals filed for this visit.  Subjective Assessment - 04/13/19 1405    Subjective  No new complaitns. Has started Gabapenin to help with pain/sleep at night. Has also returned to work for 4 hours a day Mon-Fri. Was tired after the 1st day, seems to be better now.    Patient is accompained by:  Family member   spouse   Limitations  Walking;Standing;House hold activities    How long can you walk comfortably?  Less than 5 mins before having increased fatigue.     Patient Stated Goals  "I wanna get back to the way I was."    Currently in Pain?  No/denies    Pain Score  0-No pain         [facility] PT Assessment - 04/13/19 1408      Transfers   Transfers  Sit to Stand;Stand to Sit    Sit to Stand  5: Supervision;Without upper extremity assist;With upper extremity assist;From chair/3-in-1;From bed    Five time sit to stand comments   16.31 sec's no UE support from standard height surface    Stand to Sit  5: Supervision;Without upper extremity assist;With upper extremity assist;To bed;To chair/3-in-1      Ambulation/Gait   Ambulation/Gait  Yes    Ambulation/Gait Assistance  5: Supervision    Ambulation/Gait Assistance Details  no balance issues noted.     Ambulation Distance (Feet)  500 Feet   x1, plus around gym   Assistive device  None    Gait Pattern  Step-through pattern;Decreased arm swing - right;Decreased stance time - right;Decreased weight shift to right;Antalgic    Ambulation Surface  Level;Unlevel;Indoor;Outdoor;Paved    Gait velocity  9.72 sec's=  3.37 ft/sec no AD      Standardized Balance Assessment   Standardized Balance Assessment  Berg Balance Test      Berg Balance Test   Sit to Stand  Able to stand without using hands and stabilize independently    Standing Unsupported  Able to stand safely 2 minutes    Sitting with Back Unsupported but Feet Supported on Floor or Stool  Able to sit safely and securely 2 minutes    Stand to Sit  Sits safely with minimal use of hands    Transfers  Able to transfer safely, minor use of hands    Standing Unsupported with Eyes Closed  Able to stand 10 seconds with supervision    Standing Unsupported with Feet Together  Able to place feet together independently and stand for 1 minute with supervision    From Standing, Reach Forward with Outstretched Arm  Can reach confidently >25 cm (10")    From Standing Position, Pick up Object from Floor  Able to pick up shoe safely and easily    From Standing  Position, Turn to Look Behind Over each Shoulder  Looks behind from both sides and weight shifts well    Turn 360 Degrees  Needs close supervision or verbal cueing   mild dizziness with turning   Standing Unsupported, Alternately Place Feet on Step/Stool  Able to stand independently and safely and complete 8 steps in 20 seconds   13.22 sec's   Standing Unsupported, One Foot in Front  Able to plae foot ahead of the other independently and hold 30 seconds    Standing on One Leg  Able to lift leg independently and hold 5-10 seconds    Total Score  49    Berg comment:  49/56= moderate       Functional Gait  Assessment   Gait assessed   Yes    Gait Level Surface  Walks 20 ft in less than 7 sec but greater than 5.5 sec, uses assistive device, slower speed, mild gait deviations, or deviates 6-10 in outside of the 12 in walkway width.   6.44 sec's   Change in Gait Speed  Able to change speed, demonstrates mild gait deviations, deviates 6-10 in outside of the 12 in walkway width, or no gait deviations, unable to achieve a major change in velocity, or uses a change in velocity, or uses an assistive device.    Gait with Horizontal Head Turns  Performs head turns smoothly with slight change in gait velocity (eg, minor disruption to smooth gait path), deviates 6-10 in outside 12 in walkway width, or uses an assistive device.    Gait with Vertical Head Turns  Performs head turns with no change in gait. Deviates no more than 6 in outside 12 in walkway width.    Gait and Pivot Turn  Pivot turns safely in greater than 3 sec and stops with no loss of balance, or pivot turns safely within 3 sec and stops with mild imbalance, requires small steps to catch balance.   >5 sec's   Step Over Obstacle  Is able to step over one shoe box (4.5 in total height) but must slow down and adjust steps to clear box safely. May require verbal cueing.    Gait with Narrow Base of Support  Ambulates 7-9 steps.    Gait with Eyes Closed   Walks 20 ft, slow speed, abnormal gait pattern, evidence for imbalance, deviates 10-15 in outside 12 in walkway width. Requires  more than 9 sec to ambulate 20 ft.   15 sec's   Ambulating Backwards  Walks 20 ft, uses assistive device, slower speed, mild gait deviations, deviates 6-10 in outside 12 in walkway width.    Steps  Two feet to a stair, must use rail.   reciprocal ascending/step to descending   Total Score  18    FGA comment:  <19 = high fall risk            PT Short Term Goals - 04/13/19 1407      PT SHORT TERM GOAL #1   Title  Patient will be independent with initial HEP in order to indicate decreased fall risk and improved functional mobility. (ALL STGs due 04/10/19)    Baseline  04/13/19: met with current program    Status  Achieved      PT SHORT TERM GOAL #2   Title  Patient's gait velocity with LRAD will be >/= 1.52 ft/s in order to demonstrate a decreased risk for falling.    Baseline  04/13/19: 3.37 ft/sec no AD    Time  --    Period  --    Status  Achieved      PT SHORT TERM GOAL #3   Title  Patient will score < 45 seconds on the 5 time sit to stand test in order to demonstrate improvement in her functional lower extremity strength and a decrease in risk of falling.    Baseline  04/13/19: 16.31 sec's no UE support from standard height surface    Time  --    Period  --    Status  Achieved      PT SHORT TERM GOAL #4   Title  Patient will score >/= 24/56 on the Berg Balance Test in order to demonstrate a decrease in her risk of falling.    Baseline  04/13/19: 49/56 scored today    Time  --    Period  --    Status  Achieved      PT SHORT TERM GOAL #5   Title  The patient will demonstrate ability to ambulate >/= 150 feet with LRAD and supervision to indicate improvement in safety with household ambulation.    Baseline  04/13/19: 500 feet no AD with supervision in indoor/outdoor paved surfaces    Time  --    Period  --    Status  Achieved        PT Long Term Goals  - 04/13/19 1643      PT LONG TERM GOAL #1   Title  Patient will be independent with initial HEP in order to indicate decreased fall risk and improved functional mobility. (All LTGs due 05/10/19    Time  8    Period  Weeks    Status  On-going      PT LONG TERM GOAL #2   Title  Patient's gait velocity with LRAD will be >/= 1.8 ft/s in order to demonstrate a decreased risk for falling.    Baseline  04/13/19: 3.37 ft/sec no AD    Status  Achieved      PT LONG TERM GOAL #3   Title  Patient will score </= 18 seconds on the 5 time sit to stand test in order to demonstrate improvement in her functional lower extremity strength and decrease in risk of falls.    Baseline  04/13/19: 16.31 sec's no UE support from standard height chair    Status  Achieved  PT LONG TERM GOAL #4   Title  Patient will score >45/56 on the berg balance test in order to demonstrate a decrease in fall risk.    Baseline  04/13/19: 49/56 scored today    Status  Achieved      PT LONG TERM GOAL #5   Title  Patient will demonstrate ability to ambulate >/= 500 feet with LRAD and mod I on indoor and outdoor surfaces to indicate safety with return to full community ambulation.    Baseline  04/13/19: 500 feet with supervision on indoor and outdoor paved surfaces with no AD    Period  Weeks    Status  Partially Met            Plan - 04/13/19 1407    Clinical Impression Statement  Today's skilled session focused on progress toward goals with all STGs met and most LTGs met as well. Pt has shown significant progress with her gait speed, on the [employer] and with 5 time sit to stand test. She is still in a moderate risk for fall category with score of 49/56 on Berg Balance test, improved from 18/56. Functional Gait Assessment performed today with score of 18/30, also in a fall risk category. The pt should benefit from continued PT to progress toward unmet goals and continue to address balance. Will sent to primary PT to have  goals updated due to pt's rapid/significant progress.    Personal Factors and Comorbidities  Comorbidity 3+    Comorbidities  COVID positive 01/29/19 wiht subsequent CVA with R sided weakness 02/22/19 and PE, DM type 2, HLD, hx of Hodgkin's lymphoma and cervical/ovarian cancer (in remission)    Examination-Activity Limitations  Transfers;Bend;Lift;Squat;Locomotion Level;Stairs;Stand    Examination-Participation Restrictions  Cleaning;Laundry;Community Activity;Shop;Driving;Meal Prep    Stability/Clinical Decision Making  Evolving/Moderate complexity    Rehab Potential  Excellent    PT Frequency  2x / week    PT Duration  8 weeks    PT Treatment/Interventions  ADLs/Self Care Home Management;Cryotherapy;Electrical Stimulation;Moist Heat;DME Instruction;Gait training;Therapeutic exercise;Therapeutic activities;Functional mobility training;Stair training;Balance training;Neuromuscular re-education;Cognitive remediation;Patient/family education;Orthotic Fit/Training;Manual techniques;Passive range of motion;Energy conservation    PT Next Visit Plan  balance / neuromuscular reeducation, gait training including outdoor surfaces,dynamic gait activities    Consulted and Agree with Plan of Care  Patient;Family member/caregiver    Family Member Consulted  husband - [name]       Patient will benefit from skilled therapeutic intervention in order to improve the following deficits and impairments:  Abnormal gait, Decreased balance, Decreased endurance, Decreased mobility, Difficulty walking, Impaired sensation, Cardiopulmonary status limiting activity, Decreased knowledge of precautions, Decreased range of motion, Dizziness, Improper body mechanics, Decreased activity tolerance, Decreased coordination, Decreased knowledge of use of DME, Decreased safety awareness, Decreased strength, Impaired flexibility, Postural dysfunction, Decreased cognition  Visit Diagnosis: Other abnormalities of gait and  mobility  Unsteadiness on feet  Weakness generalized  Hemiplegia and hemiparesis following cerebral infarction affecting right dominant side [facility])     Problem List Patient Active Problem List   Diagnosis Date Noted  . C. difficile diarrhea 02/27/2019  . CVA (cerebrovascular accident) ([place]) 02/27/2019  . Cerebrovascular accident (CVA) ([place])   . HLD (hyperlipidemia) 02/21/2019  . Controlled type 2 diabetes mellitus with hyperglycemia ([place]) 02/21/2019  . Hypothyroidism 02/21/2019  . Acute pulmonary embolism ([place]) 02/21/2019  . COVID-19 virus infection 02/21/2019  . PE (physical exam), annual 02/20/2019  . Acute pulmonary embolism without acute cor pulmonale (HCC) 02/20/2019    Juliann Pulse  Johnsie Cancel, [facility] [address], [place], [place] [address] [phone] 04/13/19, 4:50 PM   Name: Jeanne Sims MRN: [id number] Date of Birth: Nov 16, 1955

## 2019-04-15 ENCOUNTER — Ambulatory Visit: Payer: Commercial Managed Care - PPO | Admitting: Rehabilitation

## 2019-04-18 ENCOUNTER — Ambulatory Visit: Payer: Commercial Managed Care - PPO | Admitting: Rehabilitation

## 2019-04-21 ENCOUNTER — Encounter: Payer: Self-pay | Admitting: Rehabilitation

## 2019-04-21 ENCOUNTER — Other Ambulatory Visit: Payer: Self-pay

## 2019-04-21 ENCOUNTER — Ambulatory Visit: Payer: Commercial Managed Care - PPO | Admitting: Rehabilitation

## 2019-04-21 DIAGNOSIS — R531 Weakness: Secondary | ICD-10-CM

## 2019-04-21 DIAGNOSIS — R2689 Other abnormalities of gait and mobility: Secondary | ICD-10-CM

## 2019-04-21 DIAGNOSIS — M6281 Muscle weakness (generalized): Secondary | ICD-10-CM

## 2019-04-21 DIAGNOSIS — I69351 Hemiplegia and hemiparesis following cerebral infarction affecting right dominant side: Secondary | ICD-10-CM

## 2019-04-21 DIAGNOSIS — R2681 Unsteadiness on feet: Secondary | ICD-10-CM

## 2019-04-21 NOTE — Patient Instructions (Signed)
Access Code: [email or web address] URL: [email or web address] Date: 04/21/2019 Prepared by: Cameron Sprang  Exercises Standing Hip Flexion March - 1-2 x daily - 7 x weekly - 1 sets - 4 reps Tandem Walking with Counter Support - 1-2 x daily - 7 x weekly - 1 sets - 4 reps Step Up - 1 x daily - 7 x weekly - 1 sets - 10 reps Romberg Stance on Foam Pad with Head Rotation - 1 x daily - 7 x weekly - 1 sets - 10 reps Romberg Stance with Head Nods on Foam Pad - 1 x daily - 7 x weekly - 1 sets - 10 reps Romberg Stance Eyes Closed on Foam Pad - 1 x daily - 7 x weekly - 1 sets - 3 reps - 20 hold

## 2019-04-21 NOTE — Therapy (Signed)
[facility] [address] [place], [place], [address] Phone: [phone]   Fax:  [phone]  Physical Therapy Treatment  Patient Details  Name: Jeanne Sims MRN: [id number] Date of Birth: 16-May-1955 Referring Provider (PT): Donnetta Hutching, MD   Encounter Date: 04/21/2019  PT End of Session - 04/21/19 1536    Visit Number  7    Number of Visits  17    Date for PT Re-Evaluation  05/10/19   60 day POC   Authorization Type  UMR/UHC    Authorization - Visit Number  7    Authorization - Number of Visits  30    PT Start Time  9767    PT Stop Time  1615    PT Time Calculation (min)  42 min    Equipment Utilized During Treatment  Gait belt    Activity Tolerance  Patient tolerated treatment well;No increased pain    Behavior During Therapy  WFL for tasks assessed/performed       Past Medical History:  Diagnosis Date  . Acquired deafness   . Cervical cancer ([place]) 1075  . Cervical cancer ([place])   . Diabetes mellitus without complication ([place])   . Dyspareunia   . Heart murmur   . Hodgkin disease ([place]) 1987  . Ovarian cancer [facility])    age 42  . Thyroid disease     Past Surgical History:  Procedure Laterality Date  . ABDOMINAL HYSTERECTOMY     TAH/BSO at age 26 d/t cervical CA  . BASAL CELL CARCINOMA EXCISION     face  . BOWEL RESECTION     followed splenectomy.  had bowel obstruction.  . COCHLEAR IMPLANT  1983   Dr. Thornell Mule  . SPLENECTOMY      There were no vitals filed for this visit.  Subjective Assessment - 04/21/19 1534    Subjective  Gabapentin is working but did have to decrease dose.  Working is going well, getting easier.    Patient is accompained by:  Family member   spouse   Limitations  Walking;Standing;House hold activities    How long can you walk comfortably?  Less than 5 mins before having increased fatigue.    Patient Stated Goals  "I wanna get back to the way I was."    Currently in Pain?   No/denies                       [facility] Adult PT Treatment/Exercise - 04/21/19 1554      Ambulation/Gait   Ambulation/Gait  Yes    Ambulation/Gait Assistance  5: Supervision    Ambulation/Gait Assistance Details  S within session with min cues for forward gaze and improved R proximal hip activation.     Ambulation Distance (Feet)  150 Feet    Assistive device  None    Gait Pattern  Step-through pattern;Decreased arm swing - right;Decreased stance time - right;Decreased weight shift to right;Antalgic    Ambulation Surface  Level;Indoor      Neuro Re-ed    Neuro Re-ed Details   High level balance on foam beam in // bars with feet shoulder width apart maintaining balance x 20 secs (did feet wide x 20 secs) and feet together x 20 secs without UE support (light touches needed intermittently), Progressed to performing with EC x 20 secs in feet positions as before.  Standing on beam alternating cone taps x 10 reps each side with B finger support>no  support (intermittent touching needed). Reviewed corner balance exercises from HEP and updated, see in pt instruction.  For RLE NMR performed RLE step downs tapping LLE to floor and return to upright stand with BUE support (light) x 10 reps in // bars on 6" step and then to steps to simulate home x 5 reps.  RLE step ups to 6" step with LLE march x 15 reps with light UE support.   Up/down 8, 6" steps with single rail for BLE strength in reciprocal pattern.  Pt with marked difficulty when descending.         Access Code: [email or web address] URL: [email or web address] Date: 04/21/2019 Prepared by: Cameron Sprang  Exercises Standing Hip Flexion March - 1-2 x daily - 7 x weekly - 1 sets - 4 reps Tandem Walking with Counter Support - 1-2 x daily - 7 x weekly - 1 sets - 4 reps Step Up - 1 x daily - 7 x weekly - 1 sets - 10 reps Romberg Stance on Foam Pad with Head Rotation - 1 x daily - 7 x weekly - 1 sets - 10 reps Romberg Stance with Head  Nods on Foam Pad - 1 x daily - 7 x weekly - 1 sets - 10 reps Romberg Stance Eyes Closed on Foam Pad - 1 x daily - 7 x weekly - 1 sets - 3 reps - 20 hold       PT Education - 04/21/19 2132    Education Details  updated HEP    Person(s) Educated  Patient;Spouse    Methods  Explanation;Demonstration;Handout    Comprehension  Verbalized understanding;Returned demonstration       PT Short Term Goals - 04/13/19 1407      PT SHORT TERM GOAL #1   Title  Patient will be independent with initial HEP in order to indicate decreased fall risk and improved functional mobility. (ALL STGs due 04/10/19)    Baseline  04/13/19: met with current program    Status  Achieved      PT SHORT TERM GOAL #2   Title  Patient's gait velocity with LRAD will be >/= 1.52 ft/s in order to demonstrate a decreased risk for falling.    Baseline  04/13/19: 3.37 ft/sec no AD    Time  --    Period  --    Status  Achieved      PT SHORT TERM GOAL #3   Title  Patient will score < 45 seconds on the 5 time sit to stand test in order to demonstrate improvement in her functional lower extremity strength and a decrease in risk of falling.    Baseline  04/13/19: 16.31 sec's no UE support from standard height surface    Time  --    Period  --    Status  Achieved      PT SHORT TERM GOAL #4   Title  Patient will score >/= 24/56 on the Berg Balance Test in order to demonstrate a decrease in her risk of falling.    Baseline  04/13/19: 49/56 scored today    Time  --    Period  --    Status  Achieved      PT SHORT TERM GOAL #5   Title  The patient will demonstrate ability to ambulate >/= 150 feet with LRAD and supervision to indicate improvement in safety with household ambulation.    Baseline  04/13/19: 500 feet no AD with supervision in indoor/outdoor paved surfaces  Time  --    Period  --    Status  Achieved        PT Long Term Goals - 04/21/19 2133      PT LONG TERM GOAL #1   Title  Patient will be independent with  initial HEP in order to indicate decreased fall risk and improved functional mobility. (All LTGs due 05/10/19    Time  8    Period  Weeks    Status  On-going      PT LONG TERM GOAL #2   Title  Patient's gait velocity with LRAD will be >/= 1.8 ft/s in order to demonstrate a decreased risk for falling.    Baseline  04/13/19: 3.37 ft/sec no AD    Status  Achieved      PT LONG TERM GOAL #3   Title  Patient will score </= 13 seconds on the 5 time sit to stand test in order to demonstrate improvement in her functional lower extremity strength and decrease in risk of falls.    Baseline  04/13/19: 16.31 sec's no UE support from standard height chair    Status  Revised      PT LONG TERM GOAL #4   Title  Pt will improve FGA score to >/=23/30 in order to indicate decreased fall risk.    Baseline  18/30    Status  Revised      PT LONG TERM GOAL #5   Title  Patient will demonstrate ability to ambulate >/=1000 feet without AD and mod I on varying outdoor surfaces to indicate safety with return to full community ambulation.    Baseline  04/13/19: 500 feet with supervision on indoor and outdoor paved surfaces with no AD    Period  Weeks    Status  Revised      PT LONG TERM GOAL #6   Title  Pt will negotiate up/down 4 steps with single rail in reciprocal pattern in order to indicate improved BLE strength.    Time  8    Period  Weeks    Status  New            Plan - 04/21/19 2133    Clinical Impression Statement  Skilled session focused on high level balance, RLE NMR/strengthening and updated HEP.  Pt continues to make great progress towards updated LTGs.    Personal Factors and Comorbidities  Comorbidity 3+    Comorbidities  COVID positive 01/29/19 wiht subsequent CVA with R sided weakness 02/22/19 and PE, DM type 2, HLD, hx of Hodgkin's lymphoma and cervical/ovarian cancer (in remission)    Examination-Activity Limitations  Transfers;Bend;Lift;Squat;Locomotion Level;Stairs;Stand     Examination-Participation Restrictions  Cleaning;Laundry;Community Activity;Shop;Driving;Meal Prep    Stability/Clinical Decision Making  Evolving/Moderate complexity    Rehab Potential  Excellent    PT Frequency  2x / week    PT Duration  8 weeks    PT Treatment/Interventions  ADLs/Self Care Home Management;Cryotherapy;Electrical Stimulation;Moist Heat;DME Instruction;Gait training;Therapeutic exercise;Therapeutic activities;Functional mobility training;Stair training;Balance training;Neuromuscular re-education;Cognitive remediation;Patient/family education;Orthotic Fit/Training;Manual techniques;Passive range of motion;Energy conservation    PT Next Visit Plan  balance / neuromuscular reeducation, gait training including outdoor surfaces,dynamic gait activities    Consulted and Agree with Plan of Care  Patient;Family member/caregiver    Family Member Consulted  husband - [name]       Patient will benefit from skilled therapeutic intervention in order to improve the following deficits and impairments:  Abnormal gait, Decreased balance, Decreased endurance, Decreased mobility, Difficulty  walking, Impaired sensation, Cardiopulmonary status limiting activity, Decreased knowledge of precautions, Decreased range of motion, Dizziness, Improper body mechanics, Decreased activity tolerance, Decreased coordination, Decreased knowledge of use of DME, Decreased safety awareness, Decreased strength, Impaired flexibility, Postural dysfunction, Decreased cognition  Visit Diagnosis: Other abnormalities of gait and mobility  Unsteadiness on feet  Weakness generalized  Hemiplegia and hemiparesis following cerebral infarction affecting right dominant side (HCC)  Muscle weakness (generalized)     Problem List Patient Active Problem List   Diagnosis Date Noted  . C. difficile diarrhea 02/27/2019  . CVA (cerebrovascular accident) ([place]) 02/27/2019  . Cerebrovascular accident (CVA) ([place])   . HLD  (hyperlipidemia) 02/21/2019  . Controlled type 2 diabetes mellitus with hyperglycemia ([place]) 02/21/2019  . Hypothyroidism 02/21/2019  . Acute pulmonary embolism ([place]) 02/21/2019  . COVID-19 virus infection 02/21/2019  . PE (physical exam), annual 02/20/2019  . Acute pulmonary embolism without acute cor pulmonale ([place]) 02/20/2019    Cameron Sprang, PT, MPT [facility] [address] [place], [place], [address] Phone: [phone]   Fax:  [phone] 04/21/19, 9:39 PM  Name: IFE VITELLI MRN: [id number] Date of Birth: [birth year]

## 2019-04-25 ENCOUNTER — Ambulatory Visit: Payer: Commercial Managed Care - PPO | Admitting: Rehabilitation

## 2019-04-27 ENCOUNTER — Ambulatory Visit: Payer: Commercial Managed Care - PPO | Attending: Internal Medicine

## 2019-04-27 DIAGNOSIS — Z23 Encounter for immunization: Secondary | ICD-10-CM

## 2019-04-27 NOTE — Progress Notes (Signed)
   Covid-19 Vaccination Clinic  Name:  Jeanne Sims    MRN: [id number] DOB: 07-19-1955  04/27/2019  [name] was observed post Covid-19 immunization for 15 minutes without incident. She was provided with Vaccine Information Sheet and instruction to access the V-Safe system.   [name] was instructed to call 911 with any severe reactions post vaccine: [name] Difficulty breathing  . Swelling of face and throat  . A fast heartbeat  . A bad rash all over body  . Dizziness and weakness   Immunizations Administered    Name Date Dose VIS Date Route   Pfizer COVID-19 Vaccine 04/27/2019  2:32 PM 0.3 mL 03/02/2018 Intramuscular   Manufacturer: [place]   Lot: [id number]   [place]: [id number]

## 2019-04-28 ENCOUNTER — Encounter: Payer: Self-pay | Admitting: Rehabilitation

## 2019-04-28 ENCOUNTER — Other Ambulatory Visit: Payer: Self-pay

## 2019-04-28 ENCOUNTER — Ambulatory Visit: Payer: Commercial Managed Care - PPO | Admitting: Rehabilitation

## 2019-04-28 DIAGNOSIS — R2681 Unsteadiness on feet: Secondary | ICD-10-CM

## 2019-04-28 DIAGNOSIS — R2689 Other abnormalities of gait and mobility: Secondary | ICD-10-CM

## 2019-04-28 DIAGNOSIS — M6281 Muscle weakness (generalized): Secondary | ICD-10-CM

## 2019-04-28 DIAGNOSIS — R531 Weakness: Secondary | ICD-10-CM

## 2019-04-28 DIAGNOSIS — I69351 Hemiplegia and hemiparesis following cerebral infarction affecting right dominant side: Secondary | ICD-10-CM

## 2019-04-28 NOTE — Therapy (Signed)
[facility] [address] [place], [place], [address] Phone: [phone]   Fax:  [phone]  Physical Therapy Treatment  Patient Details  Name: Jeanne Sims MRN: [id number] Date of Birth: [birth year] Referring Provider (PT): Donnetta Hutching, MD   Encounter Date: 04/28/2019  PT End of Session - 04/28/19 1637    Visit Number  8    Number of Visits  17    Date for PT Re-Evaluation  05/10/19   60 day POC   Authorization Type  UMR/UHC    Authorization - Visit Number  8    Authorization - Number of Visits  30    PT Start Time  9030    PT Stop Time  0923    PT Time Calculation (min)  45 min    Equipment Utilized During Treatment  Gait belt    Activity Tolerance  Patient tolerated treatment well;No increased pain    Behavior During Therapy  WFL for tasks assessed/performed       Past Medical History:  Diagnosis Date  . Acquired deafness   . Cervical cancer ([place]) 1075  . Cervical cancer ([place])   . Diabetes mellitus without complication ([place])   . Dyspareunia   . Heart murmur   . Hodgkin disease ([place]) 1987  . Ovarian cancer [facility])    age 64  . Thyroid disease     Past Surgical History:  Procedure Laterality Date  . ABDOMINAL HYSTERECTOMY     TAH/BSO at age 27 d/t cervical CA  . BASAL CELL CARCINOMA EXCISION     face  . BOWEL RESECTION     followed splenectomy.  had bowel obstruction.  . COCHLEAR IMPLANT  1983   Dr. Thornell Mule  . SPLENECTOMY      There were no vitals filed for this visit.  Subjective Assessment - 04/28/19 1446    Subjective  Pt doing well, had second vaccine yesterday, feeling okay so far.    Patient is accompained by:  Family member    Limitations  Walking;Standing;House hold activities    How long can you walk comfortably?  Less than 5 mins before having increased fatigue.    Patient Stated Goals  "I wanna get back to the way I was."    Currently in Pain?  No/denies                        [facility] Adult PT Treatment/Exercise - 04/28/19 1449      Ambulation/Gait   Ambulation/Gait  Yes    Ambulation/Gait Assistance  5: Supervision;4: Min assist;4: Min guard    Ambulation/Gait Assistance Details  Addressed outdoor ambulation during session over grassy surfaces, transitioning between grass, paved surfaces and up/down curb as well as uphill/downhill by retaining pond.  Pt is close S for paved surfaces and level grassy surfaces and for up/down curb step, but when descending large decline, needed min guard with cues for upright posture and controlled quad activation and more min A to ascend steep hill during session.  She reports she can still tell R LE is weaker.      Ambulation Distance (Feet)  600 Feet   outdoors and 300' indoors   Assistive device  None    Gait Pattern  Step-through pattern;Decreased arm swing - right;Decreased stance time - right;Decreased weight shift to right;Antalgic    Ambulation Surface  Level;Indoor    Stairs  Yes    Stairs Assistance  5: Supervision;4: Min guard  Stairs Assistance Details (indicate cue type and reason)  x 6-8 reps (4 reps with single light touch on rail then the rest without rails).        High Level Balance   High Level Balance Activities  Braiding;Backward walking;Direction changes;Turns;Sudden stops;Head turns;Tandem walking;Marching forwards;Marching backwards    High Level Balance Comments  Gait x 300' while performing high level tasks as above.  Performed marching and tandem gait on mats, see NMR for details.  Pt only needing intermittent min/guard for braiding, otherwise is S for all tasks.  Note some R hip tightness with braiding as well.       Neuro Re-ed    Neuro Re-ed Details   High level balance on compliant red and blue therapy mat:  alternating cone taps x 9 reps progressing to tipping cone over and back upright alternating LEs for increased time in SLS, marching forwards and backwards on mats  x 2 reps, tandem gait forwards and backwards x 2 reps with light min/guard for steadying.  Gait/balance x 115' while tossing ball with cues for having eyes/head follow ball for more vestibular challenge.  Pt does report some mild dizziness with standing rest break half way through 115'.  Continued high level balance/gait with head turns up/down, side/side, backwards gait, braiding x 300' all at close S but no overt LOB.       Exercises   Exercises  Knee/Hip      Knee/Hip Exercises: Stretches   Piriformis Stretch  1 rep;30 seconds    Piriformis Stretch Limitations  Seated with RLE crossed over LLE.  Cues for posture.       Knee/Hip Exercises: Machines for Strengthening   Other Machine  scifit stepper x 5 mins at level 3 resistance with BLEs only maintaining rpms in 60's.  Pt tolerated with moderate fatigue.              PT Education - 04/28/19 1637    Education Details  trying treadmill at next session so that she may continue to address endurance at home.    Person(s) Educated  Patient;Spouse    Methods  Explanation    Comprehension  Verbalized understanding       PT Short Term Goals - 04/13/19 1407      PT SHORT TERM GOAL #1   Title  Patient will be independent with initial HEP in order to indicate decreased fall risk and improved functional mobility. (ALL STGs due 04/10/19)    Baseline  04/13/19: met with current program    Status  Achieved      PT SHORT TERM GOAL #2   Title  Patient's gait velocity with LRAD will be >/= 1.52 ft/s in order to demonstrate a decreased risk for falling.    Baseline  04/13/19: 3.37 ft/sec no AD    Time  --    Period  --    Status  Achieved      PT SHORT TERM GOAL #3   Title  Patient will score < 45 seconds on the 5 time sit to stand test in order to demonstrate improvement in her functional lower extremity strength and a decrease in risk of falling.    Baseline  04/13/19: 16.31 sec's no UE support from standard height surface    Time  --    Period   --    Status  Achieved      PT SHORT TERM GOAL #4   Title  Patient will score >/= 24/56 on the  Berg Balance Test in order to demonstrate a decrease in her risk of falling.    Baseline  04/13/19: 49/56 scored today    Time  --    Period  --    Status  Achieved      PT SHORT TERM GOAL #5   Title  The patient will demonstrate ability to ambulate >/= 150 feet with LRAD and supervision to indicate improvement in safety with household ambulation.    Baseline  04/13/19: 500 feet no AD with supervision in indoor/outdoor paved surfaces    Time  --    Period  --    Status  Achieved        PT Long Term Goals - 04/21/19 2133      PT LONG TERM GOAL #1   Title  Patient will be independent with initial HEP in order to indicate decreased fall risk and improved functional mobility. (All LTGs due 05/10/19    Time  8    Period  Weeks    Status  On-going      PT LONG TERM GOAL #2   Title  Patient's gait velocity with LRAD will be >/= 1.8 ft/s in order to demonstrate a decreased risk for falling.    Baseline  04/13/19: 3.37 ft/sec no AD    Status  Achieved      PT LONG TERM GOAL #3   Title  Patient will score </= 13 seconds on the 5 time sit to stand test in order to demonstrate improvement in her functional lower extremity strength and decrease in risk of falls.    Baseline  04/13/19: 16.31 sec's no UE support from standard height chair    Status  Revised      PT LONG TERM GOAL #4   Title  Pt will improve FGA score to >/=23/30 in order to indicate decreased fall risk.    Baseline  18/30    Status  Revised      PT LONG TERM GOAL #5   Title  Patient will demonstrate ability to ambulate >/=1000 feet without AD and mod I on varying outdoor surfaces to indicate safety with return to full community ambulation.    Baseline  04/13/19: 500 feet with supervision on indoor and outdoor paved surfaces with no AD    Period  Weeks    Status  Revised      PT LONG TERM GOAL #6   Title  Pt will negotiate up/down 4  steps with single rail in reciprocal pattern in order to indicate improved BLE strength.    Time  8    Period  Weeks    Status  New            Plan - 04/28/19 1638    Clinical Impression Statement  Session focused on high level gait and balance with SLS on compliant surfaces along with high level gait/balance tasks and gait over outdoor surfaces.  Pt continues to demonstrate RLE weakness and decreased endurance.    Personal Factors and Comorbidities  Comorbidity 3+    Comorbidities  COVID positive 01/29/19 wiht subsequent CVA with R sided weakness 02/22/19 and PE, DM type 2, HLD, hx of Hodgkin's lymphoma and cervical/ovarian cancer (in remission)    Examination-Activity Limitations  Transfers;Bend;Lift;Squat;Locomotion Level;Stairs;Stand    Examination-Participation Restrictions  Cleaning;Laundry;Community Activity;Shop;Driving;Meal Prep    Stability/Clinical Decision Making  Evolving/Moderate complexity    Rehab Potential  Excellent    PT Frequency  2x / week    PT  Duration  8 weeks    PT Treatment/Interventions  ADLs/Self Care Home Management;Cryotherapy;Electrical Stimulation;Moist Heat;DME Instruction;Gait training;Therapeutic exercise;Therapeutic activities;Functional mobility training;Stair training;Balance training;Neuromuscular re-education;Cognitive remediation;Patient/family education;Orthotic Fit/Training;Manual techniques;Passive range of motion;Energy conservation    PT Next Visit Plan  put her on treadmill to work on endurance, try inclines too for more strengthening, balance / neuromuscular reeducation, gait training including outdoor surfaces,dynamic gait activities    PT Home Exercise Plan  should be ready for d/c 5/3    Consulted and Agree with Plan of Care  Patient;Family member/caregiver    Family Member Consulted  husband - [name]       Patient will benefit from skilled therapeutic intervention in order to improve the following deficits and impairments:  Abnormal gait,  Decreased balance, Decreased endurance, Decreased mobility, Difficulty walking, Impaired sensation, Cardiopulmonary status limiting activity, Decreased knowledge of precautions, Decreased range of motion, Dizziness, Improper body mechanics, Decreased activity tolerance, Decreased coordination, Decreased knowledge of use of DME, Decreased safety awareness, Decreased strength, Impaired flexibility, Postural dysfunction, Decreased cognition  Visit Diagnosis: Other abnormalities of gait and mobility  Unsteadiness on feet  Weakness generalized  Hemiplegia and hemiparesis following cerebral infarction affecting right dominant side (HCC)  Muscle weakness (generalized)     Problem List Patient Active Problem List   Diagnosis Date Noted  . C. difficile diarrhea 02/27/2019  . CVA (cerebrovascular accident) ([place]) 02/27/2019  . Cerebrovascular accident (CVA) ([place])   . HLD (hyperlipidemia) 02/21/2019  . Controlled type 2 diabetes mellitus with hyperglycemia ([place]) 02/21/2019  . Hypothyroidism 02/21/2019  . Acute pulmonary embolism ([place]) 02/21/2019  . COVID-19 virus infection 02/21/2019  . PE (physical exam), annual 02/20/2019  . Acute pulmonary embolism without acute cor pulmonale ([place]) 02/20/2019    Cameron Sprang, PT, MPT [facility] [address] [place], [place], [address] Phone: [phone]   Fax:  [phone] 04/28/19, 4:41 PM  Name: SCOTTLYN MCHANEY MRN: [id number] Date of Birth: 01-05-1956

## 2019-05-03 ENCOUNTER — Ambulatory Visit: Payer: Commercial Managed Care - PPO | Admitting: Physical Therapy

## 2019-05-05 ENCOUNTER — Ambulatory Visit: Payer: Commercial Managed Care - PPO | Admitting: Physical Therapy

## 2019-05-05 ENCOUNTER — Encounter: Payer: Self-pay | Admitting: Physical Therapy

## 2019-05-05 ENCOUNTER — Other Ambulatory Visit: Payer: Self-pay

## 2019-05-05 DIAGNOSIS — R2689 Other abnormalities of gait and mobility: Secondary | ICD-10-CM

## 2019-05-05 DIAGNOSIS — R531 Weakness: Secondary | ICD-10-CM

## 2019-05-05 DIAGNOSIS — M6281 Muscle weakness (generalized): Secondary | ICD-10-CM

## 2019-05-05 DIAGNOSIS — R2681 Unsteadiness on feet: Secondary | ICD-10-CM

## 2019-05-05 DIAGNOSIS — I69351 Hemiplegia and hemiparesis following cerebral infarction affecting right dominant side: Secondary | ICD-10-CM

## 2019-05-05 NOTE — Therapy (Signed)
[facility] [address] [place], [place], [address] Phone: [phone]   Fax:  [phone]  Physical Therapy Treatment  Patient Details  Name: [place] Sims MRN: [id number] Date of Birth: 06-18-1955 Referring Provider (PT): Donnetta Hutching, MD   Encounter Date: 05/05/2019  PT End of Session - 05/05/19 1451    Visit Number  9    Number of Visits  17    Date for PT Re-Evaluation  05/10/19   60 day POC   Authorization Type  UMR/UHC    Authorization - Visit Number  9    Authorization - Number of Visits  30    PT Start Time  6812    PT Stop Time  7517    PT Time Calculation (min)  42 min    Equipment Utilized During Treatment  Gait belt    Activity Tolerance  Patient tolerated treatment well;No increased pain    Behavior During Therapy  WFL for tasks assessed/performed       Past Medical History:  Diagnosis Date  . Acquired deafness   . Cervical cancer ([place]) 1075  . Cervical cancer ([place])   . Diabetes mellitus without complication ([place])   . Dyspareunia   . Heart murmur   . Hodgkin disease ([place]) 1987  . Ovarian cancer [facility])    age 64  . Thyroid disease     Past Surgical History:  Procedure Laterality Date  . ABDOMINAL HYSTERECTOMY     TAH/BSO at age 32 d/t cervical CA  . BASAL CELL CARCINOMA EXCISION     face  . BOWEL RESECTION     followed splenectomy.  had bowel obstruction.  . COCHLEAR IMPLANT  1983   Dr. Thornell Mule  . SPLENECTOMY      There were no vitals filed for this visit.  Subjective Assessment - 05/05/19 1450    Subjective  No new complaints. No falls. Working between 4-6 hours now Mon-Fri. HEP is going well.    Patient is accompained by:  Family member   spouse   Limitations  Walking;Standing;House hold activities    How long can you walk comfortably?  Less than 5 mins before having increased fatigue.    Patient Stated Goals  "I wanna get back to the way I was."    Currently in Pain?   No/denies    Pain Score  0-No pain           [facility] Adult PT Treatment/Exercise - 05/05/19 1453      Transfers   Transfers  Sit to Stand;Stand to Sit    Sit to Stand  6: Modified independent (Device/Increase time)    Stand to Sit  6: Modified independent (Device/Increase time)      Ambulation/Gait   Ambulation/Gait  Yes    Ambulation/Gait Assistance  5: Supervision    Ambulation/Gait Assistance Details  no imbalance noted. 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     Ambulation Distance (Feet)  600 Feet   x1, plus around gym.    Assistive device  None    Gait Pattern  Step-through pattern;Decreased arm swing - right;Decreased stance time - right;Decreased weight shift to right;Antalgic    Ambulation Surface  Level;Unlevel;Indoor;Outdoor;Paved;Grass      Neuro Re-ed    Neuro Re-ed Details   for balance/muscle re-ed: blue mat on ramp- performed facing both up/down ramp with feet hip width apart- EC no head movements for 30 sec's for 3 reps,  progressing to EC head movements left<>right, up<>down x 10 reps each, min guard assist; facing up ramp- alternating fwd steppipng/mini lunge and back for 10 reps each side with min guard to min assist, cues on weight shifting.       Knee/Hip Exercises: Aerobic   Tread Mill  for 7 minutes with bil UE support, speed 1.5 mph. HR max 96 bpm. no imbalance noted. Pt fatigued afterwards.                PT Short Term Goals - 04/13/19 1407      PT SHORT TERM GOAL #1   Title  Patient will be independent with initial HEP in order to indicate decreased fall risk and improved functional mobility. (ALL STGs due 04/10/19)    Baseline  04/13/19: met with current program    Status  Achieved      PT SHORT TERM GOAL #2   Title  Patient's gait velocity with LRAD will be >/= 1.52 ft/s in order to demonstrate a decreased risk for falling.    Baseline  04/13/19: 3.37 ft/sec no AD    Time  --    Period  --    Status  Achieved       PT SHORT TERM GOAL #3   Title  Patient will score < 45 seconds on the 5 time sit to stand test in order to demonstrate improvement in her functional lower extremity strength and a decrease in risk of falling.    Baseline  04/13/19: 16.31 sec's no UE support from standard height surface    Time  --    Period  --    Status  Achieved      PT SHORT TERM GOAL #4   Title  Patient will score >/= 24/56 on the Berg Balance Test in order to demonstrate a decrease in her risk of falling.    Baseline  04/13/19: 49/56 scored today    Time  --    Period  --    Status  Achieved      PT SHORT TERM GOAL #5   Title  The patient will demonstrate ability to ambulate >/= 150 feet with LRAD and supervision to indicate improvement in safety with household ambulation.    Baseline  04/13/19: 500 feet no AD with supervision in indoor/outdoor paved surfaces    Time  --    Period  --    Status  Achieved        PT Long Term Goals - 04/21/19 2133      PT LONG TERM GOAL #1   Title  Patient will be independent with initial HEP in order to indicate decreased fall risk and improved functional mobility. (All LTGs due 05/10/19    Time  8    Period  Weeks    Status  On-going      PT LONG TERM GOAL #2   Title  Patient's gait velocity with LRAD will be >/= 1.8 ft/s in order to demonstrate a decreased risk for falling.    Baseline  04/13/19: 3.37 ft/sec no AD    Status  Achieved      PT LONG TERM GOAL #3   Title  Patient will score </= 13 seconds on the 5 time sit to stand test in order to demonstrate improvement in her functional lower extremity strength and decrease in risk of falls.    Baseline  04/13/19: 16.31 sec's no UE support from standard height chair    Status  Revised      PT LONG TERM GOAL #4   Title  Pt will improve FGA score to >/=23/30 in order to indicate decreased fall risk.    Baseline  18/30    Status  Revised      PT LONG TERM GOAL #5   Title  Patient will demonstrate ability to ambulate  >/=1000 feet without AD and mod I on varying outdoor surfaces to indicate safety with return to full community ambulation.    Baseline  04/13/19: 500 feet with supervision on indoor and outdoor paved surfaces with no AD    Period  Weeks    Status  Revised      PT LONG TERM GOAL #6   Title  Pt will negotiate up/down 4 steps with single rail in reciprocal pattern in order to indicate improved BLE strength.    Time  8    Period  Weeks    Status  New            Plan - 05/05/19 1452    Clinical Impression Statement  Today's skilled session initially addressed use of treadmill as a way for pt to continue to work on activity tolerance at home. No issues noted with use of treadmill in session today, other than fatigue. Remainder of session continued to address gait on outdoor surfaces including grassy hills and balance reactions on complaint surfaces. The pt is progressing well toward goals. Continues to fatigue with activity, needing rest breaks through out session.    Personal Factors and Comorbidities  Comorbidity 3+    Comorbidities  COVID positive 01/29/19 wiht subsequent CVA with R sided weakness 02/22/19 and PE, DM type 2, HLD, hx of Hodgkin's lymphoma and cervical/ovarian cancer (in remission)    Examination-Activity Limitations  Transfers;Bend;Lift;Squat;Locomotion Level;Stairs;Stand    Examination-Participation Restrictions  Cleaning;Laundry;Community Activity;Shop;Driving;Meal Prep    Stability/Clinical Decision Making  Evolving/Moderate complexity    Rehab Potential  Excellent    PT Frequency  2x / week    PT Duration  8 weeks    PT Treatment/Interventions  ADLs/Self Care Home Management;Cryotherapy;Electrical Stimulation;Moist Heat;DME Instruction;Gait training;Therapeutic exercise;Therapeutic activities;Functional mobility training;Stair training;Balance training;Neuromuscular re-education;Cognitive remediation;Patient/family education;Orthotic Fit/Training;Manual techniques;Passive  range of motion;Energy conservation    PT Next Visit Plan  assess goals for anticipated discharge    Consulted and Agree with Plan of Care  Patient;Family member/caregiver   spouse   Family Member Consulted  husband - [name]       Patient will benefit from skilled therapeutic intervention in order to improve the following deficits and impairments:  Abnormal gait, Decreased balance, Decreased endurance, Decreased mobility, Difficulty walking, Impaired sensation, Cardiopulmonary status limiting activity, Decreased knowledge of precautions, Decreased range of motion, Dizziness, Improper body mechanics, Decreased activity tolerance, Decreased coordination, Decreased knowledge of use of DME, Decreased safety awareness, Decreased strength, Impaired flexibility, Postural dysfunction, Decreased cognition  Visit Diagnosis: Other abnormalities of gait and mobility  Unsteadiness on feet  Hemiplegia and hemiparesis following cerebral infarction affecting right dominant side (HCC)  Weakness generalized  Muscle weakness (generalized)     Problem List Patient Active Problem List   Diagnosis Date Noted  . C. difficile diarrhea 02/27/2019  . CVA (cerebrovascular accident) ([place]) 02/27/2019  . Cerebrovascular accident (CVA) ([place])   . HLD (hyperlipidemia) 02/21/2019  . Controlled type 2 diabetes mellitus with hyperglycemia ([place]) 02/21/2019  . Hypothyroidism 02/21/2019  . Acute pulmonary embolism ([place]) 02/21/2019  . COVID-19 virus infection 02/21/2019  . PE (physical exam), annual 02/20/2019  . Acute  pulmonary embolism without acute cor pulmonale (HCC) 02/20/2019    Willow Ora, PTA, [facility] [address], [place], [place] [address] [phone] 05/05/19, 9:28 PM   Name: MARQUISE LAMBSON MRN: [id number] Date of Birth: 1955-07-03

## 2019-05-09 ENCOUNTER — Other Ambulatory Visit: Payer: Self-pay

## 2019-05-09 ENCOUNTER — Ambulatory Visit: Payer: Commercial Managed Care - PPO | Attending: Internal Medicine | Admitting: Rehabilitation

## 2019-05-09 ENCOUNTER — Encounter: Payer: Self-pay | Admitting: Rehabilitation

## 2019-05-09 DIAGNOSIS — I69351 Hemiplegia and hemiparesis following cerebral infarction affecting right dominant side: Secondary | ICD-10-CM | POA: Diagnosis present

## 2019-05-09 DIAGNOSIS — R2681 Unsteadiness on feet: Secondary | ICD-10-CM | POA: Diagnosis present

## 2019-05-09 DIAGNOSIS — R2689 Other abnormalities of gait and mobility: Secondary | ICD-10-CM | POA: Insufficient documentation

## 2019-05-09 DIAGNOSIS — M6281 Muscle weakness (generalized): Secondary | ICD-10-CM | POA: Insufficient documentation

## 2019-05-09 NOTE — Therapy (Signed)
[facility] [address] [place], [place], [address] Phone: [phone]   Fax:  [phone]  Physical Therapy Treatment and D/C Summary  Patient Details  Name: [place]ne Sims MRN: [id number] Date of Birth: [birth year] Referring Provider (PT): Donnetta Hutching, MD   Encounter Date: 05/09/2019  PT End of Session - 05/09/19 1435    Visit Number  10    Number of Visits  17    Date for PT Re-Evaluation  05/10/19   60 day POC   Authorization Type  UMR/UHC    Authorization - Visit Number  10    Authorization - Number of Visits  30    PT Start Time  1400    PT Stop Time  5956   LO visit, did not need full time   PT Time Calculation (min)  25 min    Equipment Utilized During Treatment  Gait belt    Activity Tolerance  Patient tolerated treatment well;No increased pain    Behavior During Therapy  WFL for tasks assessed/performed       Past Medical History:  Diagnosis Date  . Acquired deafness   . Cervical cancer ([place]) 1075  . Cervical cancer ([place])   . Diabetes mellitus without complication ([place])   . Dyspareunia   . Heart murmur   . Hodgkin disease ([place]) 1987  . Ovarian cancer [facility])    age 66  . Thyroid disease     Past Surgical History:  Procedure Laterality Date  . ABDOMINAL HYSTERECTOMY     TAH/BSO at age 39 d/t cervical CA  . BASAL CELL CARCINOMA EXCISION     face  . BOWEL RESECTION     followed splenectomy.  had bowel obstruction.  . COCHLEAR IMPLANT  1983   Dr. Thornell Mule  . SPLENECTOMY      There were no vitals filed for this visit.  Subjective Assessment - 05/09/19 1404    Subjective  No complaints, coming from work today, so tired.    Limitations  Walking;Standing;House hold activities    How long can you walk comfortably?  Less than 5 mins before having increased fatigue.    Patient Stated Goals  "I wanna get back to the way I was."    Currently in Pain?  No/denies         [facility] PT Assessment -  05/09/19 1415      Ambulation/Gait   Ambulation/Gait  Yes    Ambulation/Gait Assistance  6: Modified independent (Device/Increase time)    Ambulation/Gait Assistance Details  Pt is mod I for simulated outdoor gait.  Due to poor weather, PT had pt negotiate over varying surfaces, around and over obstacles.  Pt with no LOB, mild fatigue, and slightly decreased speed with certain tasks.      Ambulation Distance (Feet)  1000 Feet    Assistive device  None    Gait Pattern  Step-through pattern;Decreased arm swing - right;Decreased stance time - right;Decreased weight shift to right;Antalgic    Ambulation Surface  Level;Indoor;Unlevel   simulated outdoor   [place]  6: Modified independent (Device/Increase time)    Stair Management Technique  No rails;Alternating pattern;Forwards    Number of Stairs  8    Height of Stairs  6      High Level Balance   High Level Balance Activities  Backward walking;Direction changes;Turns;Sudden stops;Head turns    High Level Balance Comments  x 300 (included in simulated  outdoor gait) without LOB.       Functional Gait  Assessment   Gait assessed   Yes    Gait Level Surface  Walks 20 ft in less than 5.5 sec, no assistive devices, good speed, no evidence for imbalance, normal gait pattern, deviates no more than 6 in outside of the 12 in walkway width.    Change in Gait Speed  Able to smoothly change walking speed without loss of balance or gait deviation. Deviate no more than 6 in outside of the 12 in walkway width.    Gait with Horizontal Head Turns  Performs head turns smoothly with no change in gait. Deviates no more than 6 in outside 12 in walkway width    Gait with Vertical Head Turns  Performs head turns with no change in gait. Deviates no more than 6 in outside 12 in walkway width.    Gait and Pivot Turn  Pivot turns safely within 3 sec and stops quickly with no loss of balance.    Step Over Obstacle  Is able to step over 2 stacked  shoe boxes taped together (9 in total height) without changing gait speed. No evidence of imbalance.    Gait with Narrow Base of Support  Is able to ambulate for 10 steps heel to toe with no staggering.    Gait with Eyes Closed  Walks 20 ft, uses assistive device, slower speed, mild gait deviations, deviates 6-10 in outside 12 in walkway width. Ambulates 20 ft in less than 9 sec but greater than 7 sec.    Ambulating Backwards  Walks 20 ft, uses assistive device, slower speed, mild gait deviations, deviates 6-10 in outside 12 in walkway width.    Steps  Alternating feet, no rail.    Total Score  28    FGA comment:  25-28 = low risk fall                            PT Education - 05/09/19 1434    Education Details  Progress with goals, continuing to do treadmill at home as needed 2/2 weather    Person(s) Educated  Patient;Spouse    Methods  Explanation    Comprehension  Verbalized understanding       PT Short Term Goals - 04/13/19 1407      PT SHORT TERM GOAL #1   Title  Patient will be independent with initial HEP in order to indicate decreased fall risk and improved functional mobility. (ALL STGs due 04/10/19)    Baseline  04/13/19: met with current program    Status  Achieved      PT SHORT TERM GOAL #2   Title  Patient's gait velocity with LRAD will be >/= 1.52 ft/s in order to demonstrate a decreased risk for falling.    Baseline  04/13/19: 3.37 ft/sec no AD    Time  --    Period  --    Status  Achieved      PT SHORT TERM GOAL #3   Title  Patient will score < 45 seconds on the 5 time sit to stand test in order to demonstrate improvement in her functional lower extremity strength and a decrease in risk of falling.    Baseline  04/13/19: 16.31 sec's no UE support from standard height surface    Time  --    Period  --    Status  Achieved  PT SHORT TERM GOAL #4   Title  Patient will score >/= 24/56 on the Berg Balance Test in order to demonstrate a decrease in her  risk of falling.    Baseline  04/13/19: 49/56 scored today    Time  --    Period  --    Status  Achieved      PT SHORT TERM GOAL #5   Title  The patient will demonstrate ability to ambulate >/= 150 feet with LRAD and supervision to indicate improvement in safety with household ambulation.    Baseline  04/13/19: 500 feet no AD with supervision in indoor/outdoor paved surfaces    Time  --    Period  --    Status  Achieved        PT Long Term Goals - 05/09/19 1404      PT LONG TERM GOAL #1   Title  Patient will be independent with initial HEP in order to indicate decreased fall risk and improved functional mobility. (All LTGs due 05/10/19    Baseline  met per pt report    Time  8    Period  Weeks    Status  Achieved      PT LONG TERM GOAL #2   Title  Patient's gait velocity with LRAD will be >/= 1.8 ft/s in order to demonstrate a decreased risk for falling.    Baseline  04/13/19: 3.37 ft/sec no AD    Status  Achieved      PT LONG TERM GOAL #3   Title  Patient will score </= 13 seconds on the 5 time sit to stand test in order to demonstrate improvement in her functional lower extremity strength and decrease in risk of falls.    Baseline  10.40 secs without UE support.    Status  Achieved      PT LONG TERM GOAL #4   Title  Pt will improve FGA score to >/=23/30 in order to indicate decreased fall risk.    Baseline  28/30    Status  Achieved      PT LONG TERM GOAL #5   Title  Patient will demonstrate ability to ambulate >/=1000 feet without AD and mod I on varying outdoor surfaces to indicate safety with return to full community ambulation.    Baseline  simulated 1000' indoors over varying surfaces at mod I level.    Period  Weeks    Status  Achieved      PT LONG TERM GOAL #6   Title  Pt will negotiate up/down 4 steps with single rail in reciprocal pattern in order to indicate improved BLE strength.    Time  8    Period  Weeks    Status  Achieved         PHYSICAL THERAPY  DISCHARGE SUMMARY  Visits from Start of Care: 10  Current functional level related to goals / functional outcomes: See goals above   Remaining deficits: Pt with high level endurance deficits, is increasing work hours progressively.    Education / Equipment: HEP for high level balance, walking program.   Plan: Patient agrees to discharge.  Patient goals were met. Patient is being discharged due to meeting the stated rehab goals.  ?????         Plan - 05/09/19 1435    Clinical Impression Statement  Skilled session focused on assessment of LTGs and d/C.  Pt has met all LTGs and has returned to work 4-6 hours  per day.  Pt ready for D/C at this time.    Personal Factors and Comorbidities  Comorbidity 3+    Comorbidities  COVID positive 01/29/19 wiht subsequent CVA with R sided weakness 02/22/19 and PE, DM type 2, HLD, hx of Hodgkin's lymphoma and cervical/ovarian cancer (in remission)    Examination-Activity Limitations  Transfers;Bend;Lift;Squat;Locomotion Level;Stairs;Stand    Examination-Participation Restrictions  Cleaning;Laundry;Community Activity;Shop;Driving;Meal Prep    Stability/Clinical Decision Making  Evolving/Moderate complexity    Rehab Potential  Excellent    PT Frequency  2x / week    PT Duration  8 weeks    PT Treatment/Interventions  ADLs/Self Care Home Management;Cryotherapy;Electrical Stimulation;Moist Heat;DME Instruction;Gait training;Therapeutic exercise;Therapeutic activities;Functional mobility training;Stair training;Balance training;Neuromuscular re-education;Cognitive remediation;Patient/family education;Orthotic Fit/Training;Manual techniques;Passive range of motion;Energy conservation    Consulted and Agree with Plan of Care  Patient;Family member/caregiver   spouse   Family Member Consulted  husband - [name]       Patient will benefit from skilled therapeutic intervention in order to improve the following deficits and impairments:  Abnormal gait, Decreased  balance, Decreased endurance, Decreased mobility, Difficulty walking, Impaired sensation, Cardiopulmonary status limiting activity, Decreased knowledge of precautions, Decreased range of motion, Dizziness, Improper body mechanics, Decreased activity tolerance, Decreased coordination, Decreased knowledge of use of DME, Decreased safety awareness, Decreased strength, Impaired flexibility, Postural dysfunction, Decreased cognition  Visit Diagnosis: Other abnormalities of gait and mobility  Unsteadiness on feet  Hemiplegia and hemiparesis following cerebral infarction affecting right dominant side (HCC)  Muscle weakness (generalized)     Problem List Patient Active Problem List   Diagnosis Date Noted  . C. difficile diarrhea 02/27/2019  . CVA (cerebrovascular accident) ([place]) 02/27/2019  . Cerebrovascular accident (CVA) ([place])   . HLD (hyperlipidemia) 02/21/2019  . Controlled type 2 diabetes mellitus with hyperglycemia ([place]) 02/21/2019  . Hypothyroidism 02/21/2019  . Acute pulmonary embolism ([place]) 02/21/2019  . COVID-19 virus infection 02/21/2019  . PE (physical exam), annual 02/20/2019  . Acute pulmonary embolism without acute cor pulmonale ([place]) 02/20/2019   Cameron Sprang, PT, MPT [facility] [address] [place], [place], [address] Phone: [phone]   Fax:  [phone] 05/09/19, 2:39 PM  Name: LYNETTE TOPETE MRN: [id number] Date of Birth: September 19, 1955

## 2019-05-12 ENCOUNTER — Ambulatory Visit: Payer: Commercial Managed Care - PPO | Admitting: Physical Therapy

## 2019-05-16 ENCOUNTER — Encounter: Payer: Self-pay | Admitting: Critical Care Medicine

## 2019-05-16 ENCOUNTER — Ambulatory Visit: Payer: Commercial Managed Care - PPO | Admitting: Critical Care Medicine

## 2019-05-16 ENCOUNTER — Other Ambulatory Visit: Payer: Self-pay

## 2019-05-16 VITALS — BP 136/72 | HR 83 | Temp 98.5°F | Ht 66.0 in | Wt 184.0 lb

## 2019-05-16 DIAGNOSIS — Z8616 Personal history of COVID-19: Secondary | ICD-10-CM | POA: Diagnosis not present

## 2019-05-16 DIAGNOSIS — Z86711 Personal history of pulmonary embolism: Secondary | ICD-10-CM | POA: Diagnosis not present

## 2019-05-16 DIAGNOSIS — R0600 Dyspnea, unspecified: Secondary | ICD-10-CM

## 2019-05-16 DIAGNOSIS — R0609 Other forms of dyspnea: Secondary | ICD-10-CM

## 2019-05-16 NOTE — Progress Notes (Signed)
ICD-10-CM   1. History of COVID-19  Z86.16 CT Chest High Resolution    Pulmonary function test  2. DOE (dyspnea on exertion)  R06.00 CT Chest High Resolution    Pulmonary function test  3. History of pulmonary embolus (PE)  Z86.711     Chronic dyspnea on exertion post COVID-19 viral pneumonia.  Likely her symptoms are due to residual effects of pulmonary embolus.  No right heart strain on echocardiogram at that time.  No evidence of pneumonia. -PFTs -HRCT chest to rule out post Covid fibrosis -Complete 3 months of treatment for provoked DVT due to COVID-19 viral infection.  Would recommend screening for residual clot 2 to 4 weeks after discontinuation of apixaban by checking D-dimer level.  There is no recommendation for follow-up CT imaging post PE, especially given the significant radiation exposure this would entail. -Up-to-date on age-appropriate cancer screening  History of PE -Continue Eliquis to complete 3 months.  Will reassess at follow-up if she is okay to discontinue.  RTC in 1-2 months after PFTs and HRCT chest.   Current Outpatient Medications:  .  apixaban (ELIQUIS) 5 MG TABS tablet, Take 2 tablets (10 mg total) by mouth 2 (two) times daily for 5 days. LAST DOSE ON 03/01/19-BEFORE SWITCHING TO 5 MG TWICE DAILY ON 2/24 (Patient taking differently: Take 10 mg by mouth 2 (two) times daily. TAKE 2 TABLETS (10 MG TOTALLY) BY MOUTH TWICE DAILY UNTIL 03/29/2019; THEN TAKE 1 TABLET (5 MG TOTALLY) BY MOUTH TWICE DAILY ON 03/02/2019), Disp: 60 tablet, Rfl: 0 .  Cyanocobalamin 1000 MCG/ML KIT, Inject as directed every 30 (thirty) days., Disp: , Rfl:  .  dexlansoprazole (DEXILANT) 60 MG capsule, Take 60 mg by mouth daily., Disp: , Rfl:  .  Dulaglutide (TRULICITY) 1.5 LK/4.4WN SOPN, Inject 1.5 mg into the skin once a week. , Disp: , Rfl:  .  ferrous sulfate 325 (65 FE) MG tablet, Take 1 tablet (325 mg total) by mouth daily with breakfast. Please take with a source of vitamin C (orange  juice or tablet with 500 units of Vitamin C)., Disp: 90 tablet, Rfl: 3 .  folic acid (FOLVITE) 027 MCG tablet, Take 800 mcg by mouth daily., Disp: , Rfl:  .  gabapentin (NEURONTIN) 100 MG capsule, Take 1 capsule (100 mg total) by mouth at bedtime., Disp: 90 capsule, Rfl: 3 .  levothyroxine (EUTHYROX) 175 MCG tablet, Take 175 mcg by mouth daily before breakfast., Disp: , Rfl:  .  metFORMIN (GLUCOPHAGE) 500 MG tablet, Take 500 mg by mouth 2 (two) times daily with a meal., Disp: , Rfl:  .  metoprolol succinate (TOPROL-XL) 25 MG 24 hr tablet, Take 12.5 mg by mouth daily. , Disp: , Rfl:  .  rosuvastatin (CRESTOR) 20 MG tablet, Take 1 tablet (20 mg total) by mouth daily., Disp: 30 tablet, Rfl: 0 .  Vitamin D, Ergocalciferol, (DRISDOL) 1.25 MG (50000 UT) CAPS capsule, Take 50,000 Units by mouth every 7 (seven) days., Disp: , Rfl:     Julian Hy, DO [facility] 05/16/2019 6:58 PM

## 2019-05-16 NOTE — Patient Instructions (Addendum)
Thank you for visiting Dr. Carlis Abbott at [facility]. We recommend the following: Orders Placed This Encounter  Procedures  . CT Chest High Resolution  . Pulmonary function test   Orders Placed This Encounter  Procedures  . CT Chest High Resolution    Mid-May 2021    Standing Status:   Future    Standing Expiration Date:   07/15/2020    Order Specific Question:   ** REASON FOR EXAM (FREE TEXT)    Answer:   history of covid, PEs    Order Specific Question:   Preferred imaging location?    Answer:   [facility]    Order Specific Question:   Radiology Contrast Protocol - do NOT remove file path    Answer:   [email or web address]  . Pulmonary function test    Standing Status:   Future    Standing Expiration Date:   05/15/2020    Order Specific Question:   Where should this test be performed?    Answer:   [facility]    Order Specific Question:   Full PFT: includes the following: basic spirometry, spirometry pre & post bronchodilator, diffusion capacity (DLCO), lung volumes    Answer:   Full PFT     Keep taking Eliquis until follow up.   Return in about 1 month (around 06/16/2019). after PFTs (June 2021)    Please do your part to reduce the spread of COVID-19.

## 2019-05-19 ENCOUNTER — Telehealth: Payer: Self-pay | Admitting: Critical Care Medicine

## 2019-05-19 NOTE — Telephone Encounter (Signed)
I spoke with with pt's husband and he wanted me to disregard the call. It was a mistake. Will close encounter.

## 2019-05-26 ENCOUNTER — Ambulatory Visit (INDEPENDENT_AMBULATORY_CARE_PROVIDER_SITE_OTHER)
Admission: RE | Admit: 2019-05-26 | Discharge: 2019-05-26 | Disposition: A | Discharge status: Home / Self Care | Payer: Commercial Managed Care - PPO | Source: Ambulatory Visit | Attending: Critical Care Medicine | Admitting: Critical Care Medicine

## 2019-05-26 ENCOUNTER — Other Ambulatory Visit: Payer: Self-pay

## 2019-05-26 DIAGNOSIS — R0600 Dyspnea, unspecified: Secondary | ICD-10-CM | POA: Diagnosis not present

## 2019-05-26 DIAGNOSIS — Z8616 Personal history of COVID-19: Secondary | ICD-10-CM

## 2019-05-26 DIAGNOSIS — R0609 Other forms of dyspnea: Secondary | ICD-10-CM

## 2019-06-11 ENCOUNTER — Emergency Department (HOSPITAL_BASED_OUTPATIENT_CLINIC_OR_DEPARTMENT_OTHER): Payer: Commercial Managed Care - PPO

## 2019-06-11 ENCOUNTER — Emergency Department (HOSPITAL_BASED_OUTPATIENT_CLINIC_OR_DEPARTMENT_OTHER)
Admission: EM | Admit: 2019-06-11 | Discharge: 2019-06-11 | Disposition: A | Discharge status: Home / Self Care | Payer: Commercial Managed Care - PPO | Attending: Emergency Medicine | Admitting: Emergency Medicine

## 2019-06-11 ENCOUNTER — Other Ambulatory Visit: Payer: Self-pay

## 2019-06-11 ENCOUNTER — Encounter (HOSPITAL_BASED_OUTPATIENT_CLINIC_OR_DEPARTMENT_OTHER): Payer: Self-pay

## 2019-06-11 DIAGNOSIS — I69341 Monoplegia of lower limb following cerebral infarction affecting right dominant side: Secondary | ICD-10-CM | POA: Insufficient documentation

## 2019-06-11 DIAGNOSIS — Z7901 Long term (current) use of anticoagulants: Secondary | ICD-10-CM | POA: Insufficient documentation

## 2019-06-11 DIAGNOSIS — Z87891 Personal history of nicotine dependence: Secondary | ICD-10-CM | POA: Insufficient documentation

## 2019-06-11 DIAGNOSIS — Z79899 Other long term (current) drug therapy: Secondary | ICD-10-CM | POA: Diagnosis not present

## 2019-06-11 DIAGNOSIS — R519 Headache, unspecified: Secondary | ICD-10-CM | POA: Diagnosis not present

## 2019-06-11 DIAGNOSIS — Z86711 Personal history of pulmonary embolism: Secondary | ICD-10-CM | POA: Diagnosis not present

## 2019-06-11 DIAGNOSIS — R42 Dizziness and giddiness: Secondary | ICD-10-CM | POA: Insufficient documentation

## 2019-06-11 DIAGNOSIS — E119 Type 2 diabetes mellitus without complications: Secondary | ICD-10-CM | POA: Insufficient documentation

## 2019-06-11 DIAGNOSIS — Z7984 Long term (current) use of oral hypoglycemic drugs: Secondary | ICD-10-CM | POA: Insufficient documentation

## 2019-06-11 LAB — URINALYSIS, ROUTINE W REFLEX MICROSCOPIC
Bilirubin Urine: NEGATIVE
Glucose, UA: NEGATIVE mg/dL
Hgb urine dipstick: NEGATIVE
Ketones, ur: 15 mg/dL — AB
Leukocytes,Ua: NEGATIVE
Nitrite: NEGATIVE
Protein, ur: NEGATIVE mg/dL
Specific Gravity, Urine: 1.02 (ref 1.005–1.030)
pH: 6 (ref 5.0–8.0)

## 2019-06-11 LAB — CBC
HCT: 41.4 % (ref 36.0–46.0)
Hemoglobin: 13.4 g/dL (ref 12.0–15.0)
MCH: 30.7 pg (ref 26.0–34.0)
MCHC: 32.4 g/dL (ref 30.0–36.0)
MCV: 95 fL (ref 80.0–100.0)
Platelets: 518 10*3/uL — ABNORMAL HIGH (ref 150–400)
RBC: 4.36 MIL/uL (ref 3.87–5.11)
RDW: 13.2 % (ref 11.5–15.5)
WBC: 17.2 10*3/uL — ABNORMAL HIGH (ref 4.0–10.5)
nRBC: 0 % (ref 0.0–0.2)

## 2019-06-11 LAB — BASIC METABOLIC PANEL
Anion gap: 9 (ref 5–15)
BUN: 14 mg/dL (ref 8–23)
CO2: 27 mmol/L (ref 22–32)
Calcium: 9 mg/dL (ref 8.9–10.3)
Chloride: 101 mmol/L (ref 98–111)
Creatinine, Ser: 0.85 mg/dL (ref 0.44–1.00)
GFR calc Af Amer: >60 mL/min (ref >60)
GFR calc non Af Amer: >60 mL/min (ref >60)
Glucose, Bld: 142 mg/dL — ABNORMAL HIGH (ref 70–99)
Potassium: 3.7 mmol/L (ref 3.5–5.1)
Sodium: 137 mmol/L (ref 135–145)

## 2019-06-11 MED ORDER — PROMETHAZINE HCL 25 MG/ML IJ SOLN
12.5000 mg | Freq: Once | INTRAMUSCULAR | Status: AC
  Administered 2019-06-11: 12.5 mg via INTRAVENOUS
  Filled 2019-06-11: qty 1

## 2019-06-11 MED ORDER — IOHEXOL 350 MG/ML SOLN
100.0000 mL | Freq: Once | INTRAVENOUS | Status: AC | PRN
  Administered 2019-06-11: 100 mL via INTRAVENOUS

## 2019-06-11 NOTE — ED Triage Notes (Addendum)
Pt has had 3 episodes of dizziness and double vision over the last 2 weeks (2 today). Symptoms last 1-2 minutes at a time and then resolve. Pt also c/o HA that started today. BEFAST screening is negative in triage.

## 2019-06-11 NOTE — ED Provider Notes (Signed)
[place] EMERGENCY DEPARTMENT Provider Note   CSN: [id number] Arrival date & time: 06/11/19  1322     History Chief Complaint  Patient presents with  . Dizziness    Jeanne Sims is a 64 y.o. female.  Patient is a 64 year old female who presents with episodes of dizziness.  She has a history of diabetes, chronic leukocytosis, prior cervical cancer, deafness with cochlear implants.  She was admitted to the hospital in January after an infection with Covid that resulted in pulmonary emboli.  She also was noted to have a stroke with right-sided weakness on February 16 while in the hospital.  She says that she has done well and currently has some residual weakness in the right leg but otherwise is pretty much back to baseline.  She says over the last week and a half she has had 3 episodes where she suddenly gets an onset of dizziness.  She feels like things are spinning and she has double vision.  She also has an associated headache with these episodes.  Today was the worst episode and she said that she had a sudden onset of dizziness associated with seen 5 of everything.  When she closed off 1 eyes she said it looked blurry but when she opened up and looked with both eyes she would see multiple images of what she was looking up.  She also today has an associated occipital headache.  She says the symptoms lasted about 2 to 3 minutes and then resolved.  She currently does not have any symptoms.  She has not noticed any change in her speech.  No numbness or weakness in her extremities other than the baseline weakness in her right leg from the prior stroke.  She is on Eliquis.  She is unable to obtain MRIs given her cochlear implants.        Past Medical History:  Diagnosis Date  . Acquired deafness   . Cervical cancer ([place]) 1075  . Cervical cancer ([place])   . Diabetes mellitus without complication ([place])   . Dyspareunia   . GERD (gastroesophageal reflux disease)   . Heart murmur     . Hodgkin disease ([place]) 1987  . Ovarian cancer [facility])    age 72  . Psoriasis   . Thyroid disease     Patient Active Problem List   Diagnosis Date Noted  . C. difficile diarrhea 02/27/2019  . CVA (cerebrovascular accident) ([place]) 02/27/2019  . Cerebrovascular accident (CVA) ([place])   . HLD (hyperlipidemia) 02/21/2019  . Controlled type 2 diabetes mellitus with hyperglycemia ([place]) 02/21/2019  . Hypothyroidism 02/21/2019  . Acute pulmonary embolism ([place]) 02/21/2019  . COVID-19 virus infection 02/21/2019  . PE (physical exam), annual 02/20/2019  . Acute pulmonary embolism without acute cor pulmonale ([place]) 02/20/2019    Past Surgical History:  Procedure Laterality Date  . ABDOMINAL HYSTERECTOMY     TAH/BSO at age 52 d/t cervical CA  . BASAL CELL CARCINOMA EXCISION     face  . BOWEL RESECTION     followed splenectomy.  had bowel obstruction.  . COCHLEAR IMPLANT  1983   Dr. Thornell Mule  . SPLENECTOMY     a/w Hodgkin's lymphoma     OB History   No obstetric history on file.     Family History  Problem Relation Age of Onset  . Heart failure Mother   . Diabetes Mother   . Diabetes Brother   . Diabetes Sister   . Cervical cancer Sister   .  Diabetes Sister   . Cervical cancer Sister   . Diabetes Sister   . Diabetes Brother   . Breast cancer Paternal Uncle   . Breast cancer Maternal Aunt     Social History   Tobacco Use  . Smoking status: Former Smoker    Packs/day: 1.00    Years: 19.00    Pack years: 19.00    Types: Cigarettes    Quit date: 08/2000    Years since quitting: 18.8  . Smokeless tobacco: Never Used  Substance Use Topics  . Alcohol use: No  . Drug use: No    Home Medications Prior to Admission medications   Medication Sig Start Date End Date Taking? Authorizing Provider  apixaban (ELIQUIS) 5 MG TABS tablet Take 2 tablets (10 mg total) by mouth 2 (two) times daily for 5 days. LAST DOSE ON 03/01/19-BEFORE SWITCHING TO 5 MG TWICE DAILY ON 2/24 Patient  taking differently: Take 10 mg by mouth 2 (two) times daily. TAKE 2 TABLETS (10 MG TOTALLY) BY MOUTH TWICE DAILY UNTIL 03/29/2019; THEN TAKE 1 TABLET (5 MG TOTALLY) BY MOUTH TWICE DAILY ON 03/02/2019 02/23/19 05/16/19  Ghimire, Henreitta Leber, MD  Cyanocobalamin 1000 MCG/ML KIT Inject as directed every 30 (thirty) days.    [provider]  dexlansoprazole (DEXILANT) 60 MG capsule Take 60 mg by mouth daily.    [provider]  Dulaglutide (TRULICITY) 1.5 QV/9.5GL SOPN Inject 1.5 mg into the skin once a week.     [provider]  ferrous sulfate 325 (65 FE) MG tablet Take 1 tablet (325 mg total) by mouth daily with breakfast. Please take with a source of vitamin C (orange juice or tablet with 500 units of Vitamin C). 11/19/18   Orson Slick, MD  folic acid (FOLVITE) 875 MCG tablet Take 800 mcg by mouth daily.    [provider]  gabapentin (NEURONTIN) 100 MG capsule Take 1 capsule (100 mg total) by mouth at bedtime. 04/12/19   Frann Rider, NP  levothyroxine (EUTHYROX) 175 MCG tablet Take 175 mcg by mouth daily before breakfast.    [provider]  metFORMIN (GLUCOPHAGE) 500 MG tablet Take 500 mg by mouth 2 (two) times daily with a meal.    [provider]  metoprolol succinate (TOPROL-XL) 25 MG 24 hr tablet Take 12.5 mg by mouth daily.     [provider]  rosuvastatin (CRESTOR) 20 MG tablet Take 1 tablet (20 mg total) by mouth daily. 02/24/19   Ghimire, Henreitta Leber, MD  Vitamin D, Ergocalciferol, (DRISDOL) 1.25 MG (50000 UT) CAPS capsule Take 50,000 Units by mouth every 7 (seven) days.    [provider]    Allergies    Patient has no known allergies.  Review of Systems   Review of Systems  Constitutional: Negative for chills, diaphoresis, fatigue and fever.  HENT: Negative for congestion, rhinorrhea and sneezing.   Eyes: Positive for visual disturbance.  Respiratory: Negative for cough, chest tightness and shortness of breath.    Cardiovascular: Negative for chest pain and leg swelling.  Gastrointestinal: Negative for abdominal pain, blood in stool, diarrhea, nausea and vomiting.  Genitourinary: Negative for difficulty urinating, flank pain, frequency and hematuria.  Musculoskeletal: Negative for arthralgias and back pain.  Skin: Negative for rash.  Neurological: Positive for dizziness and headaches. Negative for speech difficulty, weakness and numbness.    Physical Exam Updated Vital Signs BP (!) 149/72 (BP Location: Left Arm)   Pulse 87   Temp 98.2  F (36.8 C) (Oral)   Resp 17   Ht 5' 6" (1.676 m)   Wt 82.1 kg   SpO2 94%   BMI 29.21 kg/m   Physical Exam Constitutional:      Appearance: She is well-developed.  HENT:     Head: Normocephalic and atraumatic.  Eyes:     Pupils: Pupils are equal, round, and reactive to light.  Cardiovascular:     Rate and Rhythm: Normal rate and regular rhythm.     Heart sounds: Normal heart sounds.  Pulmonary:     Effort: Pulmonary effort is normal. No respiratory distress.     Breath sounds: Normal breath sounds. No wheezing or rales.  Chest:     Chest wall: No tenderness.  Abdominal:     General: Bowel sounds are normal.     Palpations: Abdomen is soft.     Tenderness: There is no abdominal tenderness. There is no guarding or rebound.  Musculoskeletal:        General: Normal range of motion.     Cervical back: Normal range of motion and neck supple.  Lymphadenopathy:     Cervical: No cervical adenopathy.  Skin:    General: Skin is warm and dry.     Findings: No rash.  Neurological:     Mental Status: She is alert and oriented to person, place, and time.     Comments: Motor 5/5 all extremities, other than 4/5 in RLE Sensation grossly intact to LT all extremities Finger to Nose intact, no pronator drift CN II-XII grossly intact Visual fields full to confrontation       ED Results / Procedures / Treatments   Labs (all labs ordered are listed, but  only abnormal results are displayed) Labs Reviewed  BASIC METABOLIC PANEL - Abnormal; Notable for the following components:      Result Value   Glucose, Bld 142 (*)    All other components within normal limits  CBC - Abnormal; Notable for the following components:   WBC 17.2 (*)    Platelets 518 (*)    All other components within normal limits  URINALYSIS, ROUTINE W REFLEX MICROSCOPIC - Abnormal; Notable for the following components:   APPearance HAZY (*)    Ketones, ur 15 (*)    All other components within normal limits    EKG EKG Interpretation  Date/Time:  Saturday June 11 2019 15:17:00 EDT Ventricular Rate:  86 PR Interval:  162 QRS Duration: 76 QT Interval:  384 QTC Calculation: 459 R Axis:   58 Text Interpretation: Normal sinus rhythm Normal ECG since last tracing no significant change Confirmed by Malvin Johns [phone]) on 06/11/2019 3:34:54 PM   Radiology CT Angio Head W or Wo Contrast  Result Date: 06/11/2019 CLINICAL DATA:  Dizziness, nonspecific. Additional history provided: 3 episodes of dizziness and double vision over the last 2 weeks, symptoms lasting 1-2 minutes at a time, headache which started today. EXAM: CT ANGIOGRAPHY HEAD AND NECK TECHNIQUE: Multidetector CT imaging of the head and neck was performed using the standard protocol during bolus administration of intravenous contrast. Multiplanar CT image reconstructions and MIPs were obtained to evaluate the vascular anatomy. Carotid stenosis measurements (when applicable) are obtained utilizing NASCET criteria, using the distal internal carotid diameter as the denominator. CONTRAST:  190m OMNIPAQUE IOHEXOL 350 MG/ML SOLN COMPARISON:  CT angiogram head/neck 02/22/2019, noncontrast head CT performed earlier the same day FINDINGS: CTA NECK FINDINGS Aortic arch: Standard aortic branching. Atherosclerotic plaque within the visualized aortic arch.  No hemodynamically significant innominate or proximal subclavian artery  stenosis. Right carotid system: See seen ICA patent within the neck without significant stenosis (50% or greater). Redemonstrated mild mixed plaque within the carotid bifurcation and proximal ICA. Left carotid system: CCA and ICA patent within the neck without significant stenosis (50% or greater). Redemonstrated mild mixed plaque within the carotid bifurcation and proximal ICA. Vertebral arteries: The left vertebral artery is dominant. The vertebral arteries are patent within the neck without significant stenosis. Skeleton: No acute bony abnormality or aggressive osseous lesion. Other neck: No neck mass or cervical lymphadenopathy. Thyroid gland is atrophic or surgically absent. Upper chest: No consolidation within the imaged lung apices. Review of the MIP images confirms the above findings CTA HEAD FINDINGS Anterior circulation: Streak artifact from a left-sided cochlear implant limits evaluation of M2 and more distal left MCA branch vessels. The intracranial internal carotid arteries are patent without significant stenosis. Mild calcified plaque within these vessels. The M1 middle cerebral arteries are patent without significant stenosis. No M2 proximal branch occlusion or high-grade proximal stenosis is identified. The anterior cerebral arteries are patent bilaterally without high-grade proximal stenosis. No intracranial aneurysm is identified. Posterior circulation: The non dominant intracranial right vertebral artery is developmentally diminutive beyond the origin of the right PICA, although patent. The dominant intracranial left vertebral artery is patent without significant stenosis, as is the basilar artery. The posterior cerebral arteries are patent proximally without significant stenosis. Posterior communicating arteries are hypoplastic or absent bilaterally. Venous sinuses: Within limitations of contrast timing, no convincing thrombus. Anatomic variants: As described Review of the MIP images confirms the  above findings IMPRESSION: CTA neck: 1. The bilateral common and internal carotid arteries are patent within the neck without significant stenosis (50% or greater). Mild mixed plaque within the carotid bifurcations and proximal ICAs. 2. The vertebral arteries are patent within the neck without significant stenosis. The left vertebral artery is dominant. CTA head: 1. Streak artifact from a left-sided cochlear implant limits evaluation of the M2 and more distal left MCA branches. 2. No intracranial large vessel occlusion or proximal high-grade arterial stenosis identified. 3. Mild non-stenotic calcified plaque within the intracranial internal carotid arteries. Electronically Signed   By: Kellie Simmering DO   On: 06/11/2019 20:03   CT Head Wo Contrast  Result Date: 06/11/2019 CLINICAL DATA:  Dizziness x6 hours. EXAM: CT HEAD WITHOUT CONTRAST TECHNIQUE: Contiguous axial images were obtained from the base of the skull through the vertex without intravenous contrast. COMPARISON:  February 27, 2019 FINDINGS: Brain: No evidence of acute infarction, hemorrhage, hydrocephalus, extra-axial collection or mass lesion/mass effect. A left cochlear implant is seen. There is an extensive amount of associated streak artifact with subsequently limited evaluation of the left parietal and left occipital regions. Vascular: No hyperdense vessel or unexpected calcification. Skull: Normal. Negative for fracture or focal lesion. Sinuses/Orbits: No acute finding. Other: None. IMPRESSION: 1. No acute intracranial abnormality. 2. Left cochlear implant in place with an extensive amount of associated streak artifact with subsequently limited evaluation of the left parietal and left occipital regions. Electronically Signed   By: Virgina Norfolk M.D.   On: 06/11/2019 17:36   CT Angio Neck W and/or Wo Contrast  Result Date: 06/11/2019 CLINICAL DATA:  Dizziness, nonspecific. Additional history provided: 3 episodes of dizziness and double vision  over the last 2 weeks, symptoms lasting 1-2 minutes at a time, headache which started today. EXAM: CT ANGIOGRAPHY HEAD AND NECK TECHNIQUE: Multidetector CT imaging of the head and  neck was performed using the standard protocol during bolus administration of intravenous contrast. Multiplanar CT image reconstructions and MIPs were obtained to evaluate the vascular anatomy. Carotid stenosis measurements (when applicable) are obtained utilizing NASCET criteria, using the distal internal carotid diameter as the denominator. CONTRAST:  132m OMNIPAQUE IOHEXOL 350 MG/ML SOLN COMPARISON:  CT angiogram head/neck 02/22/2019, noncontrast head CT performed earlier the same day FINDINGS: CTA NECK FINDINGS Aortic arch: Standard aortic branching. Atherosclerotic plaque within the visualized aortic arch. No hemodynamically significant innominate or proximal subclavian artery stenosis. Right carotid system: See seen ICA patent within the neck without significant stenosis (50% or greater). Redemonstrated mild mixed plaque within the carotid bifurcation and proximal ICA. Left carotid system: CCA and ICA patent within the neck without significant stenosis (50% or greater). Redemonstrated mild mixed plaque within the carotid bifurcation and proximal ICA. Vertebral arteries: The left vertebral artery is dominant. The vertebral arteries are patent within the neck without significant stenosis. Skeleton: No acute bony abnormality or aggressive osseous lesion. Other neck: No neck mass or cervical lymphadenopathy. Thyroid gland is atrophic or surgically absent. Upper chest: No consolidation within the imaged lung apices. Review of the MIP images confirms the above findings CTA HEAD FINDINGS Anterior circulation: Streak artifact from a left-sided cochlear implant limits evaluation of M2 and more distal left MCA branch vessels. The intracranial internal carotid arteries are patent without significant stenosis. Mild calcified plaque within these  vessels. The M1 middle cerebral arteries are patent without significant stenosis. No M2 proximal branch occlusion or high-grade proximal stenosis is identified. The anterior cerebral arteries are patent bilaterally without high-grade proximal stenosis. No intracranial aneurysm is identified. Posterior circulation: The non dominant intracranial right vertebral artery is developmentally diminutive beyond the origin of the right PICA, although patent. The dominant intracranial left vertebral artery is patent without significant stenosis, as is the basilar artery. The posterior cerebral arteries are patent proximally without significant stenosis. Posterior communicating arteries are hypoplastic or absent bilaterally. Venous sinuses: Within limitations of contrast timing, no convincing thrombus. Anatomic variants: As described Review of the MIP images confirms the above findings IMPRESSION: CTA neck: 1. The bilateral common and internal carotid arteries are patent within the neck without significant stenosis (50% or greater). Mild mixed plaque within the carotid bifurcations and proximal ICAs. 2. The vertebral arteries are patent within the neck without significant stenosis. The left vertebral artery is dominant. CTA head: 1. Streak artifact from a left-sided cochlear implant limits evaluation of the M2 and more distal left MCA branches. 2. No intracranial large vessel occlusion or proximal high-grade arterial stenosis identified. 3. Mild non-stenotic calcified plaque within the intracranial internal carotid arteries. Electronically Signed   By: KKellie SimmeringDO   On: 06/11/2019 20:03    Procedures Procedures (including critical care time)  Medications Ordered in ED Medications  promethazine (PHENERGAN) injection 12.5 mg (has no administration in time range)  iohexol (OMNIPAQUE) 350 MG/ML injection 100 mL (100 mLs Intravenous Contrast Given 06/11/19 1928)    ED Course  I have reviewed the triage vital signs and  the nursing notes.  Pertinent labs & imaging results that were available during my care of the patient were reviewed by me and considered in my medical decision making (see chart for details).    MDM Rules/Calculators/A&P                      Patient is a 64year old female who presents with 3 episodes over the last week  and a half of dizziness.  It sounds like vertiginous type symptoms but the episodes are very brief lasting only about 2 to 3 minutes.  She does have some visual changes associated with it but no longer lasting symptoms.  She is asymptomatic currently.  She does not have any current neurologic deficits other than her baseline right leg weakness.  She had a head CT which shows no acute abnormality.  I spoke with Dr. Cheral Marker at with neurology.  Given the short nature of the symptoms, it sounds less likely to be more suggestive of things like TIA.  She is unable to get an MRI given her cochlear implants.  We did go ahead and get a CTA of the head and neck which shows no evidence of blockage or thrombus which is also reassuring that this is not coming from an acute stroke/CVA.  She does not have evidence of atrial fibrillation which would be more concerning for embolic type phenomenon.  She is on maximum therapy for stroke prevention, on Eliquis as well as lipid management.  We will discharge her.  She is currently asymptomatic other than a headache.  She does say that her headache has worsened since she has been here and Dr. Cheral Marker had suggested giving her migraine treatment.  She was given Phenergan.  She did not want to stay for reassessment after the Phenergan was discharged home.  She will follow up with her stroke doctor on Monday.  She was given strict return precautions.     Final Clinical Impression(s) / ED Diagnoses Final diagnoses:  Dizziness    Rx / DC Orders ED Discharge Orders    None       Malvin Johns, MD 06/11/19 2125

## 2019-06-11 NOTE — Discharge Instructions (Addendum)
Follow-up with your neurologist on Monday or Tuesday.  Return to the emergency department, preferably [facility] where the stroke doctors are located, if you have any worsening symptoms.

## 2019-06-14 ENCOUNTER — Ambulatory Visit: Payer: Commercial Managed Care - PPO | Admitting: Adult Health

## 2019-06-14 ENCOUNTER — Other Ambulatory Visit: Payer: Self-pay

## 2019-06-14 ENCOUNTER — Encounter: Payer: Self-pay | Admitting: Adult Health

## 2019-06-14 VITALS — BP 122/82 | HR 78 | Ht 66.0 in | Wt 185.0 lb

## 2019-06-14 DIAGNOSIS — E1165 Type 2 diabetes mellitus with hyperglycemia: Secondary | ICD-10-CM

## 2019-06-14 DIAGNOSIS — Z8673 Personal history of transient ischemic attack (TIA), and cerebral infarction without residual deficits: Secondary | ICD-10-CM

## 2019-06-14 DIAGNOSIS — G43809 Other migraine, not intractable, without status migrainosus: Secondary | ICD-10-CM | POA: Diagnosis not present

## 2019-06-14 DIAGNOSIS — E785 Hyperlipidemia, unspecified: Secondary | ICD-10-CM | POA: Diagnosis not present

## 2019-06-14 DIAGNOSIS — I10 Essential (primary) hypertension: Secondary | ICD-10-CM | POA: Diagnosis not present

## 2019-06-14 MED ORDER — DIVALPROEX SODIUM ER 500 MG PO TB24: 500.0000 mg | ORAL_TABLET | Freq: Every day | ORAL | 3 refills | Status: DC

## 2019-06-14 NOTE — Patient Instructions (Addendum)
Start depakote 500mg  nightly for likely migraine with aura - please call office if difficulty tolerating or you continue to experience episodes  Continue Eliquis (apixaban) daily  and Crestor  for secondary stroke prevention  Please follow-up with your pulmonologist in regards to ongoing use of Eliquis as they will further advise you in regards to ongoing use and duration.  If Eliquis is discontinued, recommend initiating aspirin 81 mg daily for secondary stroke prevention  Continue to follow up with PCP regarding cholesterol and blood pressure management   Continue to monitor blood pressure at home  Maintain strict control of hypertension with blood pressure goal below 130/90, diabetes with hemoglobin A1c goal below 6.5% and cholesterol with LDL cholesterol (bad cholesterol) goal below 70 mg/dL. I also advised the patient to eat a healthy diet with plenty of whole grains, cereals, fruits and vegetables, exercise regularly and maintain ideal body weight.  Followup in the future with me in 3 months or call earlier if needed       Thank you for coming to see [place] at [facility]. I hope we have been able to provide you high quality care today.  You may receive a patient satisfaction survey over the next few weeks. We would appreciate your feedback and comments so that we may continue to improve ourselves and the health of our patients.

## 2019-06-14 NOTE — Progress Notes (Signed)
[facility] [address] [place]. [place] [address] [phone]       OFFICE FOLLOW UP NOTE  [name] Date of Birth:  October 13, 1955 Medical Record Number:  [id number]   Reason for Referral: Dizziness and diplopia accompanied by headache    CHIEF COMPLAINT:  Chief Complaint  Patient presents with  . Follow-up    1115 here for a stroke f/u. Pt is having double vision, dizziness and headache. She is here with her husband Linna Hoff.    HPI:   Today, 06/14/2019, Jeanne Sims is being seen today for new symptoms onset/concerns.  She presented to ED on 06/11/2019 with symptom onset 1.5 weeks having 3 episodes with sudden onset of dizziness accompanied by headache and double vision lasting approximately 2 to 3 minutes then resolved.  No other focal deficits except baseline RLE weakness from prior stroke.  CTA head/neck unremarkable.  Unable to obtain MRI due to cochlear implants. She reports onset of lightheadedness/dizziness with presyncopal feeling and then will develop double vision and blurred vision bilaterally and then will have onset of occipital headache. presyncopal episode will last 2-3 minutes; vision symptoms can last 5-15 minutes at times but occipital headache typically lasts longer. She denies photophobia, phonophobia and n/v. She does prior history of migraines when she was in her 85s but "grew out of them". Denies any other symptoms such as worsening weakness, imbalance, speech impairment,  numbness/tingling or radiating headache pain.  Has not established aggravating factor such as position change or head movements.  Chronic hearing concerns with cochlear implants which have been stable without worsening. Denies actual loss of consciousness, racing heart, shortness or breath or chest pain. She did have recent f/u with pulmonology but no mention about discontinuing Eliquis therefore she has continued at this time. Blood pressure monitored at home and has been stable.     No concerns from a stroke standpoint. Mild right hemiparesis stable without worsening. Continues on eliquis and crestor for secondary stroke prevention. Blood pressure today 122/82. Glucose level stable per patient. No stroke related concerns at this time.      History provided for reference purposes only Initial visit 04/05/2019, Jeanne Sims is being seen for hospital follow-up accompanied by her husband.  She does report ongoing recovery with residual mild right hemiparesis and RLE numbness/tingling with increased pain at night.  Continues to work with outpatient therapies.  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  Continues on Eliquis and Crestor for secondary stroke prevention without side effects.  Blood pressure today 133/65.  Glucose levels stable with recent A1c 6.9.  No further concerns at this time.  Stroke admission 02/20/2019: Jeanne Sims is a 64 y.o. female with history of , cervical cancer, pulmonary embolism, COVID-19 dx 3 weeks ago presented on 02/20/2019, with SOB found to have B PEs. Started on [facility]. In hospital developed altered sensation in R leg and RUE weakness.  Stroke work-up revealed likely small left subcortical infarct secondary to small vessel disease source patient with acute Covid infection and hypercoagulability with PE on Eliquis. All other imaging unremarkable.  Did not recommend further work-up at that time as treatment plan would not change.  History of HTN stable.  LDL 72 with continuation of Crestor.  Uncontrolled DM with A1c 7.2 with close PCP follow-up.  No prior history of stroke.  Discharged home in stable condition with recommendation of home health therapies with residual right-sided weakness.  Stroke:  Likely small subcortical  infarct secondary to small vessel disease source in pt w/ acute Covid infection hypercoagulability w/ PE on Eliquis  CT head 2/16 no acute abnormality. Mild atrophy.   CTA head mild plaque IC  atherosclerosis   CTA neck mild plaque aortic arch and ICAs  CT head 2/18 no acute infarct  2D Echo EF 60-65%. No source of embolus   RLE dopplers neg  LDL 72  HgbA1c 7.2      ROS:   14 system review of systems performed and negative with exception of headache, visual changes, dizziness  PMH:  Past Medical History:  Diagnosis Date  . Acquired deafness   . Cervical cancer ([place]) 1075  . Cervical cancer ([place])   . Diabetes mellitus without complication ([place])   . Dyspareunia   . GERD (gastroesophageal reflux disease)   . Heart murmur   . Hodgkin disease ([place]) 1987  . Ovarian cancer [facility])    age 85  . Psoriasis   . Thyroid disease     PSH:  Past Surgical History:  Procedure Laterality Date  . ABDOMINAL HYSTERECTOMY     TAH/BSO at age 68 d/t cervical CA  . BASAL CELL CARCINOMA EXCISION     face  . BOWEL RESECTION     followed splenectomy.  had bowel obstruction.  . COCHLEAR IMPLANT  1983   Dr. Thornell Mule  . SPLENECTOMY     a/w Hodgkin's lymphoma    Social History:  Social History   Socioeconomic History  . Marital status: Married    Spouse name: Not on file  . Number of children: Not on file  . Years of education: Not on file  . Highest education level: Not on file  Occupational History  . Not on file  Tobacco Use  . Smoking status: Former Smoker    Packs/day: 1.00    Years: 19.00    Pack years: 19.00    Types: Cigarettes    Quit date: 08/2000    Years since quitting: 18.8  . Smokeless tobacco: Never Used  Substance and Sexual Activity  . Alcohol use: No  . Drug use: No  . Sexual activity: Never    Comment: TAH/BSO  Other Topics Concern  . Not on file  Social History Narrative  . Not on file   Social Determinants of Health   Financial Resource Strain:   . Difficulty of Paying Living Expenses:   Food Insecurity:   . Worried About Charity fundraiser in the Last Year:   . Arboriculturist in the Last Year:   Transportation Needs:   . Lexicographer (Medical):   [name] Lack of Transportation (Non-Medical):   Physical Activity:   . Days of Exercise per Week:   . Minutes of Exercise per Session:   Stress:   . Feeling of Stress :   Social Connections:   . Frequency of Communication with Friends and Family:   . Frequency of Social Gatherings with Friends and Family:   . Attends Religious Services:   . Active Member of Clubs or Organizations:   . Attends Archivist Meetings:   [name] Marital Status:   Intimate Partner Violence:   . Fear of Current or Ex-Partner:   . Emotionally Abused:   [name] Physically Abused:   . Sexually Abused:     Family History:  Family History  Problem Relation Age of Onset  . Heart failure Mother   . Diabetes Mother   . Diabetes Brother   .  Diabetes Sister   . Cervical cancer Sister   . Diabetes Sister   . Cervical cancer Sister   . Diabetes Sister   . Diabetes Brother   . Breast cancer Paternal Uncle   . Breast cancer Maternal Aunt     Medications:   Current Outpatient Medications on File Prior to Visit  Medication Sig Dispense Refill  . Cyanocobalamin 1000 MCG/ML KIT Inject as directed every 30 (thirty) days.    [name] dexlansoprazole (DEXILANT) 60 MG capsule Take 60 mg by mouth daily.    . Dulaglutide (TRULICITY) 1.5 KV/4.2VZ SOPN Inject 1.5 mg into the skin once a week.     . ferrous sulfate 325 (65 FE) MG tablet Take 1 tablet (325 mg total) by mouth daily with breakfast. Please take with a source of vitamin C (orange juice or tablet with 500 units of Vitamin C). 90 tablet 3  . folic acid (FOLVITE) 563 MCG tablet Take 800 mcg by mouth daily.    [name] gabapentin (NEURONTIN) 100 MG capsule Take 1 capsule (100 mg total) by mouth at bedtime. 90 capsule 3  . levothyroxine (EUTHYROX) 175 MCG tablet Take 175 mcg by mouth daily before breakfast.    . metFORMIN (GLUCOPHAGE) 500 MG tablet Take 500 mg by mouth 2 (two) times daily with a meal.    . metoprolol succinate (TOPROL-XL) 25 MG 24 hr tablet  Take 12.5 mg by mouth daily.     . rosuvastatin (CRESTOR) 20 MG tablet Take 1 tablet (20 mg total) by mouth daily. 30 tablet 0  . Vitamin D, Ergocalciferol, (DRISDOL) 1.25 MG (50000 UT) CAPS capsule Take 50,000 Units by mouth every 7 (seven) days.    [name] apixaban (ELIQUIS) 5 MG TABS tablet Take 2 tablets (10 mg total) by mouth 2 (two) times daily for 5 days. LAST DOSE ON 03/01/19-BEFORE SWITCHING TO 5 MG TWICE DAILY ON 2/24 (Patient taking differently: Take 10 mg by mouth 2 (two) times daily. TAKE 2 TABLETS (10 MG TOTALLY) BY MOUTH TWICE DAILY UNTIL 03/29/2019; THEN TAKE 1 TABLET (5 MG TOTALLY) BY MOUTH TWICE DAILY ON 03/02/2019) 60 tablet 0   No current facility-administered medications on file prior to visit.    Allergies:  No Known Allergies   Physical Exam  Vitals:   06/14/19 1054 06/14/19 1058 06/14/19 1242  BP: (!) 142/73 (!) 158/80 122/82  Pulse: 78    Weight: 185 lb (83.9 kg)    Height: _0  (1.676 m)     Body mass index is 29.86 kg/m. No exam data present  No flowsheet data found.   General: well developed, well nourished, pleasant middle-age Caucasian female, seated, in no evident distress Head: head normocephalic and atraumatic.   Neck: supple with no carotid or supraclavicular bruits Cardiovascular: regular rate and rhythm, no murmurs Musculoskeletal: no deformity Skin:  no rash/petichiae Vascular:  Normal pulses all extremities   Neurologic Exam Mental Status: Awake and fully alert.   Normal speech and language.  Oriented to place and time. Recent and remote memory intact. Attention span, concentration and fund of knowledge appropriate. Mood and affect appropriate.  Cranial Nerves: Fundoscopic exam reveals sharp disc margins. Pupils equal, briskly reactive to light. Extraocular movements full without nystagmus. Visual fields full to confrontation. HOH with cochlear implants.  Mild right lower facial weakness. Motor: Normal bulk and tone.  Full strength left upper and  lower extremity and right hemiparesis 4/5 Sensory.:  Decreased vibratory and light touch sensation proximal RLE and distal RUE Coordination: Rapid  alternating movements normal in all extremities except mildly decreased right hand. Finger-to-nose and heel-to-shin performed accurately bilaterally. Gait and Station: Arises from chair without difficulty. Stance is normal. Mild favoring of right lower extremity 2/2 weakness with mild imbalance. Mild difficulty with tandem walk. Romberg negative  Reflexes: 1+ and symmetric. Toes downgoing.        ASSESSMENT: Jeanne Sims is a 64 y.o. year old female hospitalized on 02/20/2019 due to shortness of breath in setting of COVID-19 infection and bilateral PEs.  During admission on 02/24/2019, developed altered sensation in right leg and RUE weakness with stroke work-up revealing likely small left subcortical infarct unable to visualize on imaging secondary to small vessel disease. Vascular risk factors include HTN, HLD, DM, bilateral PEs (provoked).  Residual deficits of mild right hemiparesis and hemisensory impairment with paresthesias which has been stable without worsening.  Being seen today per patient request due to new concerns of  3x episodes of dizziness/lightheadedness and vertigo and double/blurred vision accompanied by headache with recent evaluation by ED which was largely unremarkable including CT head and CTA.  Unable to obtain MRI due to cochlear implants.    PLAN:  1. Dizziness and diplopia accompanied by headache: ddx new stroke not seen on imaging vs migraine aura vs vs vestibular migraine vs BPPV vs partial complex seizures post stroke.  Recommend trial of Depakote ER 500 mg nightly which can treat both migraines and seizures as these could be a potential etiology.  May consider repeat CT head and EEG if episodes continue.  Advised patient to notify office if any difficulty tolerating with side effects discussed or if she continues to  experience episodes.  Advised to proceed to ED with any episodes that are accompanied by other neurological symptoms for further evaluation.  2. Left subcortical stroke:  -Right hemiparesis and hemisensory impairment with paresthesias: Continuation of gabapentin 100 mg nightly for ongoing poststroke nerve pain -Continue Eliquis (apixaban) daily  and Crestor for secondary stroke prevention.  Advised ongoing use of Eliquis will be managed by pulmonology as this was started for treatment of PE.  If Eliquis is discontinued, would recommend initiating aspirin 81 mg daily for secondary stroke prevention -Maintain strict control of hypertension with blood pressure goal below 130/90, diabetes with hemoglobin A1c goal below 6.5% and cholesterol with LDL cholesterol (bad cholesterol) goal below 70 mg/dL.  I also advised the patient to eat a healthy diet with plenty of whole grains, cereals, fruits and vegetables, exercise regularly with at least 30 minutes of continuous activity daily and maintain ideal body weight. 3. HTN: Stable.  Monitoring management by PCP 4. HLD: Stable.  Monitoring management by PCP 5. DMII: Stable.  Monitoring management by PCP 6. PE in setting of Covid infection -Advised to follow-up with pulmonology in regards to ongoing use of Eliquis and ongoing follow-up    Follow up in 3 months or call earlier if needed  I spent 40 minutes of face-to-face and non-face-to-face time with patient and husband.  This included previsit chart review, lab review, study review, order entry, electronic health record documentation, patient education in regards to new onset symptoms and possible etiology, use of Depakote and possible side effects, continue management of secondary stroke risk factors and ongoing use of Eliquis for treatment of PE and answered all questions to patient and husband satisfaction   Frann Rider, AGNP-BC  [facility] [address] [place], [place] [address]  Phone [phone] Fax [phone] Note: This document was prepared  with digital dictation and possible smart phrase technology. Any transcriptional errors that result from this process are unintentional.

## 2019-06-20 ENCOUNTER — Encounter: Payer: Self-pay | Admitting: Adult Health

## 2019-06-20 NOTE — Progress Notes (Signed)
I agree with the above plan 

## 2019-06-21 MED ORDER — DIVALPROEX SODIUM ER 250 MG PO TB24: 250.0000 mg | ORAL_TABLET | Freq: Every day | ORAL | 3 refills | Status: DC

## 2019-07-01 ENCOUNTER — Other Ambulatory Visit (HOSPITAL_COMMUNITY): Payer: Commercial Managed Care - PPO

## 2019-07-05 ENCOUNTER — Other Ambulatory Visit: Payer: Self-pay

## 2019-07-05 ENCOUNTER — Encounter: Payer: Self-pay | Admitting: *Deleted

## 2019-07-05 ENCOUNTER — Telehealth: Payer: Self-pay | Admitting: Adult Health

## 2019-07-05 ENCOUNTER — Ambulatory Visit (INDEPENDENT_AMBULATORY_CARE_PROVIDER_SITE_OTHER): Payer: Commercial Managed Care - PPO | Admitting: Critical Care Medicine

## 2019-07-05 ENCOUNTER — Ambulatory Visit: Payer: Commercial Managed Care - PPO | Admitting: Critical Care Medicine

## 2019-07-05 VITALS — BP 130/68 | HR 78 | Ht 66.0 in | Wt 186.0 lb

## 2019-07-05 DIAGNOSIS — R0600 Dyspnea, unspecified: Secondary | ICD-10-CM

## 2019-07-05 DIAGNOSIS — Z86711 Personal history of pulmonary embolism: Secondary | ICD-10-CM | POA: Diagnosis not present

## 2019-07-05 DIAGNOSIS — Z8616 Personal history of COVID-19: Secondary | ICD-10-CM | POA: Diagnosis not present

## 2019-07-05 DIAGNOSIS — J4540 Moderate persistent asthma, uncomplicated: Secondary | ICD-10-CM

## 2019-07-05 DIAGNOSIS — R0609 Other forms of dyspnea: Secondary | ICD-10-CM

## 2019-07-05 LAB — PULMONARY FUNCTION TEST
DL/VA % pred: 116 %
DL/VA: 4.82 ml/min/mmHg/L
DLCO cor % pred: 81 %
DLCO cor: 17.27 ml/min/mmHg
DLCO unc % pred: 81 %
DLCO unc: 17.27 ml/min/mmHg
FEF 25-75 Post: 3.24 L/sec
FEF 25-75 Pre: 2.01 L/sec
FEF2575-%Change-Post: 61 %
FEF2575-%Pred-Post: 141 %
FEF2575-%Pred-Pre: 87 %
FEV1-%Change-Post: 14 %
FEV1-%Pred-Post: 83 %
FEV1-%Pred-Pre: 73 %
FEV1-Post: 2.21 L
FEV1-Pre: 1.93 L
FEV1FVC-%Change-Post: 0 %
FEV1FVC-%Pred-Pre: 107 %
FEV6-%Change-Post: 13 %
FEV6-%Pred-Post: 79 %
FEV6-%Pred-Pre: 69 %
FEV6-Post: 2.63 L
FEV6-Pre: 2.31 L
FEV6FVC-%Pred-Post: 103 %
FEV6FVC-%Pred-Pre: 103 %
FVC-%Change-Post: 13 %
FVC-%Pred-Post: 76 %
FVC-%Pred-Pre: 67 %
FVC-Post: 2.63 L
FVC-Pre: 2.31 L
Post FEV1/FVC ratio: 84 %
Post FEV6/FVC ratio: 100 %
Pre FEV1/FVC ratio: 83 %
Pre FEV6/FVC Ratio: 100 %
RV % pred: 96 %
RV: 2.09 L
TLC % pred: 85 %
TLC: 4.57 L

## 2019-07-05 MED ORDER — ALBUTEROL SULFATE HFA 108 (90 BASE) MCG/ACT IN AERS
2.0000 | INHALATION_SPRAY | Freq: Four times a day (QID) | RESPIRATORY_TRACT | 11 refills | Status: DC | PRN
Start: 2019-07-05 — End: 2021-09-12

## 2019-07-05 MED ORDER — ASPIRIN EC 81 MG PO TBEC
81.0000 mg | DELAYED_RELEASE_TABLET | Freq: Every day | ORAL | 11 refills | Status: DC
Start: 2019-07-05 — End: 2019-11-09

## 2019-07-05 MED ORDER — BUDESONIDE-FORMOTEROL FUMARATE 160-4.5 MCG/ACT IN AERO
2.0000 | INHALATION_SPRAY | Freq: Two times a day (BID) | RESPIRATORY_TRACT | 11 refills | Status: DC
Start: 2019-07-05 — End: 2019-11-09

## 2019-07-05 NOTE — Patient Instructions (Addendum)
Thank you for visiting Dr. Carlis Abbott at [facility]. We recommend the following:  Stop taking Eliquis.   Meds ordered this encounter  Medications  . albuterol (VENTOLIN HFA) 108 (90 Base) MCG/ACT inhaler    Sig: Inhale 2 puffs into the lungs every 6 (six) hours as needed.    Dispense:  18 g    Refill:  11  . budesonide-formoterol (SYMBICORT) 160-4.5 MCG/ACT inhaler    Sig: Inhale 2 puffs into the lungs in the morning and at bedtime.    Dispense:  1 Inhaler    Refill:  11    Return in about 3 months (around 10/05/2019).    Please do your part to reduce the spread of COVID-19.

## 2019-07-05 NOTE — Progress Notes (Signed)
Synopsis: Referred in May 2021 for history of PE by Bonnita Nasuti, MD.  Subjective:   PATIENT ID: Jeanne Sims GENDER: female DOB: [birth year], MRN: [id number]  Chief Complaint  Patient presents with  . Follow-up    Shortness of breath all the time worse with exertion. Dizziness and double vision that comes and goes since having stroke in February. Denies cough    Jeanne Sims is a 64 year old woman who presents for follow-up of shortness of breath post Covid.  She has no shortness breath at rest, only with activity.  No wheezing or cough.  This has been persistent.  She continues to take Eliquis since having her PE.  No previous history of blood clots prior to Covid.  No bleeding or bruising issues on Eliquis.  She had a stroke in February 2020 and is thought to have had a TIA earlier in June.  She followed up with neurology.  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  She has been referred to cardiology for aortic stenosis murmur.  PFTs today-no difference in breathing after receiving albuterol during test.    Past Medical History:  Diagnosis Date  . Acquired deafness   . Cervical cancer ([place]) 1075  . Cervical cancer ([place])   . CVA (cerebral vascular accident) ([place]) 02/2019  . Diabetes mellitus without complication ([place])   . Dyspareunia   . GERD (gastroesophageal reflux disease)   . Heart murmur   . Hodgkin disease ([place]) 1987  . Ovarian cancer [facility])    age 78  . Psoriasis   . Thyroid disease      Family History  Problem Relation Age of Onset  . Heart failure Mother   . Diabetes Mother   . Diabetes Brother   . Diabetes Sister   . Cervical cancer Sister   . Diabetes Sister   . Cervical cancer Sister   . Diabetes Sister   . Diabetes Brother   . Breast cancer Paternal Uncle   . Breast cancer Maternal Aunt      Past Surgical History:  Procedure Laterality Date  . ABDOMINAL HYSTERECTOMY     TAH/BSO at age 83 d/t cervical CA  . BASAL  CELL CARCINOMA EXCISION     face  . BOWEL RESECTION     followed splenectomy.  had bowel obstruction.  . COCHLEAR IMPLANT  1983   Dr. Thornell Mule  . SPLENECTOMY     a/w Hodgkin's lymphoma    Social History   Socioeconomic History  . Marital status: Married    Spouse name: Not on file  . Number of children: Not on file  . Years of education: Not on file  . Highest education level: Not on file  Occupational History  . Not on file  Tobacco Use  . Smoking status: Former Smoker    Packs/day: 1.00    Years: 19.00    Pack years: 19.00    Types: Cigarettes    Quit date: 08/2000    Years since quitting: 18.9  . Smokeless tobacco: Never Used  Vaping Use  . Vaping Use: Never used  Substance and Sexual Activity  . Alcohol use: No  . Drug use: No  . Sexual activity: Never    Comment: TAH/BSO  Other Topics Concern  . Not on file  Social History Narrative  . Not on file   Social Determinants of Health   Financial Resource Strain:   . Difficulty of Paying Living Expenses:  Food Insecurity:   . Worried About Charity fundraiser in the Last Year:   . Arboriculturist in the Last Year:   Transportation Needs:   . Film/video editor (Medical):   [name] Lack of Transportation (Non-Medical):   Physical Activity:   . Days of Exercise per Week:   . Minutes of Exercise per Session:   Stress:   . Feeling of Stress :   Social Connections:   . Frequency of Communication with Friends and Family:   . Frequency of Social Gatherings with Friends and Family:   . Attends Religious Services:   . Active Member of Clubs or Organizations:   . Attends Archivist Meetings:   [name] Marital Status:   Intimate Partner Violence:   . Fear of Current or Ex-Partner:   . Emotionally Abused:   [name] Physically Abused:   . Sexually Abused:      No Known Allergies   Immunization History  Administered Date(s) Administered  . PFIZER SARS-COV-2 Vaccination 03/31/2019, 04/27/2019  . Pneumococcal  Polysaccharide-23 02/24/2019  . Tdap 07/22/2012    Outpatient Medications Prior to Visit  Medication Sig Dispense Refill  . Cyanocobalamin 1000 MCG/ML KIT Inject as directed every 30 (thirty) days.    [name] dexlansoprazole (DEXILANT) 60 MG capsule Take 60 mg by mouth daily.    . divalproex (DEPAKOTE ER) 250 MG 24 hr tablet Take 1 tablet (250 mg total) by mouth at bedtime. 90 tablet 3  . Dulaglutide (TRULICITY) 1.5 YQ/8.2NO SOPN Inject 1.5 mg into the skin once a week.     . ferrous sulfate 325 (65 FE) MG tablet Take 1 tablet (325 mg total) by mouth daily with breakfast. Please take with a source of vitamin C (orange juice or tablet with 500 units of Vitamin C). 90 tablet 3  . folic acid (FOLVITE) 037 MCG tablet Take 800 mcg by mouth daily.    [name] gabapentin (NEURONTIN) 100 MG capsule Take 1 capsule (100 mg total) by mouth at bedtime. 90 capsule 3  . levothyroxine (EUTHYROX) 175 MCG tablet Take 175 mcg by mouth daily before breakfast.    . metFORMIN (GLUCOPHAGE) 500 MG tablet Take 500 mg by mouth 2 (two) times daily with a meal.    . metoprolol succinate (TOPROL-XL) 25 MG 24 hr tablet Take 12.5 mg by mouth daily.     . rosuvastatin (CRESTOR) 20 MG tablet Take 1 tablet (20 mg total) by mouth daily. 30 tablet 0  . Vitamin D, Ergocalciferol, (DRISDOL) 1.25 MG (50000 UT) CAPS capsule Take 50,000 Units by mouth every 7 (seven) days.    [name] apixaban (ELIQUIS) 5 MG TABS tablet Take 2 tablets (10 mg total) by mouth 2 (two) times daily for 5 days. LAST DOSE ON 03/01/19-BEFORE SWITCHING TO 5 MG TWICE DAILY ON 2/24 (Patient taking differently: Take 10 mg by mouth 2 (two) times daily. TAKE 2 TABLETS (10 MG TOTALLY) BY MOUTH TWICE DAILY UNTIL 03/29/2019; THEN TAKE 1 TABLET (5 MG TOTALLY) BY MOUTH TWICE DAILY ON 03/02/2019) 60 tablet 0   No facility-administered medications prior to visit.    Review of Systems  Constitutional: Negative for chills and fever.  HENT: Negative.   Respiratory: Positive for shortness of  breath. Negative for cough.   Cardiovascular: Negative for chest pain.  Gastrointestinal: Negative.  Negative for blood in stool and melena.  Genitourinary: Negative for hematuria.  Neurological: Positive for dizziness.  Endo/Heme/Allergies: Does not bruise/bleed easily.     Objective:  Vitals:   07/05/19 1607  BP: 130/68  Pulse: 78  SpO2: 94%  Weight: 186 lb (84.4 kg)  Height: 5' 6"  (1.676 m)   94% on   RA BMI Readings from Last 3 Encounters:  07/05/19 30.02 kg/m  06/14/19 29.86 kg/m  06/11/19 29.21 kg/m   Wt Readings from Last 3 Encounters:  07/05/19 186 lb (84.4 kg)  06/14/19 185 lb (83.9 kg)  06/11/19 181 lb (82.1 kg)    Physical Exam Vitals reviewed.  Constitutional:      General: She is not in acute distress.    Appearance: She is not ill-appearing.  HENT:     Head: Normocephalic and atraumatic.  Eyes:     General: No scleral icterus. Cardiovascular:     Rate and Rhythm: Normal rate and regular rhythm.     Comments: Right and left sternal border systolic murmur Pulmonary:     Comments: Breathing comfortably on room air, no conversational dyspnea.  Clear to auscultation bilaterally. Abdominal:     General: There is no distension.     Palpations: Abdomen is soft.  Musculoskeletal:        General: No swelling or deformity.     Cervical back: Neck supple.  Skin:    General: Skin is warm and dry.     Findings: No rash.  Neurological:     General: No focal deficit present.     Mental Status: She is alert.     Coordination: Coordination normal.  Psychiatric:        Mood and Affect: Mood normal.        Behavior: Behavior normal.      CBC    Component Value Date/Time   WBC 17.2 (H) 06/11/2019 1740   RBC 4.36 06/11/2019 1740   HGB 13.4 06/11/2019 1740   HGB 12.8 03/23/2019 0933   HCT 41.4 06/11/2019 1740   PLT 518 (H) 06/11/2019 1740   PLT 499 (H) 03/23/2019 0933   MCV 95.0 06/11/2019 1740   MCH 30.7 06/11/2019 1740   MCHC 32.4 06/11/2019  1740   RDW 13.2 06/11/2019 1740   LYMPHSABS 4.2 (H) 03/23/2019 0933   MONOABS 1.1 (H) 03/23/2019 0933   EOSABS 0.3 03/23/2019 0933   BASOSABS 0.2 (H) 03/23/2019 0933    CHEMISTRY No results for input(s): NA, K, CL, CO2, GLUCOSE, BUN, CREATININE, CALCIUM, MG, PHOS in the last 168 hours. CrCl cannot be calculated (Patient's most recent lab result is older than the maximum 21 days allowed.).   Chest Imaging- films reviewed: HRCT chest 05/26/2019-no new scarring.  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  No mediastinal or hilar adenopathy.  CTA chest 02/20/2019-minimal basilar groundglass opacities.  Medial bilateral upper lobe paramediastinal fibrosis.  PE in RML and RLL.  Pulmonary Functions Testing Results: PFT Results Latest Ref Rng & Units 07/05/2019  FVC-Pre L 2.31  FVC-Predicted Pre % 67  FVC-Post L 2.63  FVC-Predicted Post % 76  Pre FEV1/FVC % % 83  Post FEV1/FCV % % 84  FEV1-Pre L 1.93  FEV1-Predicted Pre % 73  FEV1-Post L 2.21  DLCO UNC% % 81  DLCO COR %Predicted % 116  TLC L 4.57  TLC % Predicted % 85  RV % Predicted % 96      Echocardiogram 02/21/2019: LVEF 60 to 65%, indeterminate diastolic function.  Normal LA, RV, and RA..  Trivial MR, mild AR and AS TR.      Assessment & Plan:     ICD-10-CM   1.  History of pulmonary embolus (PE)  Z86.711   2. Moderate persistent asthma without complication  O17.51   3. History of COVID-19  Z86.16      Chronic dyspnea on exertion post COVID-19 viral pneumonia-likely moderate persistent asthma based on BD reversibility on PFTs. No evidence of fibrosis on CT or PFTs. -PFTs reviewed during visit -Start Symbicort twice daily.  Rinse her mouth after every use. She was given inhaler training during the visit and given a prescription coverage card to help with the cost of this medication.  -Start albuterol every 4 hours as needed. -Would not deescalate inhaler until 3 months of stability on them. -Con't regular  physical activity to maintain/ regain exercise tolerance.   History of PE, provoked by covid -Discontinue Eliquis.  Communicated with neurology team to determine if they need to start antiplatelet medication. -Needs all age-appropriate cancer screening.  RTC in 3 months.    Current Outpatient Medications:  .  Cyanocobalamin 1000 MCG/ML KIT, Inject as directed every 30 (thirty) days., Disp: , Rfl:  .  dexlansoprazole (DEXILANT) 60 MG capsule, Take 60 mg by mouth daily., Disp: , Rfl:  .  divalproex (DEPAKOTE ER) 250 MG 24 hr tablet, Take 1 tablet (250 mg total) by mouth at bedtime., Disp: 90 tablet, Rfl: 3 .  Dulaglutide (TRULICITY) 1.5 WC/5.8NI SOPN, Inject 1.5 mg into the skin once a week. , Disp: , Rfl:  .  ferrous sulfate 325 (65 FE) MG tablet, Take 1 tablet (325 mg total) by mouth daily with breakfast. Please take with a source of vitamin C (orange juice or tablet with 500 units of Vitamin C)., Disp: 90 tablet, Rfl: 3 .  folic acid (FOLVITE) 778 MCG tablet, Take 800 mcg by mouth daily., Disp: , Rfl:  .  gabapentin (NEURONTIN) 100 MG capsule, Take 1 capsule (100 mg total) by mouth at bedtime., Disp: 90 capsule, Rfl: 3 .  levothyroxine (EUTHYROX) 175 MCG tablet, Take 175 mcg by mouth daily before breakfast., Disp: , Rfl:  .  metFORMIN (GLUCOPHAGE) 500 MG tablet, Take 500 mg by mouth 2 (two) times daily with a meal., Disp: , Rfl:  .  metoprolol succinate (TOPROL-XL) 25 MG 24 hr tablet, Take 12.5 mg by mouth daily. , Disp: , Rfl:  .  rosuvastatin (CRESTOR) 20 MG tablet, Take 1 tablet (20 mg total) by mouth daily., Disp: 30 tablet, Rfl: 0 .  Vitamin D, Ergocalciferol, (DRISDOL) 1.25 MG (50000 UT) CAPS capsule, Take 50,000 Units by mouth every 7 (seven) days., Disp: , Rfl:  .  apixaban (ELIQUIS) 5 MG TABS tablet, Take 2 tablets (10 mg total) by mouth 2 (two) times daily for 5 days. LAST DOSE ON 03/01/19-BEFORE SWITCHING TO 5 MG TWICE DAILY ON 2/24 (Patient taking differently: Take 10 mg by  mouth 2 (two) times daily. TAKE 2 TABLETS (10 MG TOTALLY) BY MOUTH TWICE DAILY UNTIL 03/29/2019; THEN TAKE 1 TABLET (5 MG TOTALLY) BY MOUTH TWICE DAILY ON 03/02/2019), Disp: 60 tablet, Rfl: 0     Julian Hy, DO [facility] 07/05/2019 4:12 PM

## 2019-07-05 NOTE — Progress Notes (Signed)
Full PFT performed today. °

## 2019-07-05 NOTE — Telephone Encounter (Signed)
Received update from pulmonology in regards to discontinuing Eliquis as greater than 3 months of therapy completed.  Recommend initiating aspirin 81 mg daily for secondary stroke prevention once Eliquis has been discontinued.

## 2019-07-06 ENCOUNTER — Telehealth: Payer: Self-pay | Admitting: Critical Care Medicine

## 2019-07-06 NOTE — Telephone Encounter (Signed)
I called pt and LMVM about JM/NP message and eliquis stopping after 3 months and taking 81mg  aspirin daily.  I mychart email to her as well.

## 2019-07-06 NOTE — Telephone Encounter (Signed)
Called patient and left message that Symbicort discount cards have expired and are no longer available. Patient advised to call us back if we can assist her further.

## 2019-07-08 ENCOUNTER — Telehealth: Payer: Self-pay

## 2019-07-08 NOTE — Telephone Encounter (Signed)
NOTES ON FILE FROM DR [place] TO [place]

## 2019-07-19 ENCOUNTER — Ambulatory Visit: Payer: Commercial Managed Care - PPO | Admitting: Adult Health

## 2019-07-27 NOTE — Telephone Encounter (Signed)
Dr Carlis Abbott- please advise on email from the pt:  I have been doing ok with the inhaler but it is making me hoarse. Sometimes my voice is above a whisper.  I stopped using for last 2 days and my voice has returned somewhat.  Is this normal? Please let me know.   -------------------------------------------------------------------------  I asked her if she rinsed her mouth after use of the symbicort and she says yes and also that it's making her reflux seem worse. Any suggestions? Please advise, thanks!

## 2019-08-02 NOTE — Progress Notes (Signed)
Cardiology Office Note:   Date:  08/04/2019  NAME:  Jeanne Sims    MRN: [id number] DOB:  [birth year]   PCP:  Bonnita Nasuti, MD  Cardiologist:  No primary care provider on file.  Electrophysiologist:  None   Referring MD: Bonnita Nasuti, MD   Chief Complaint  Patient presents with  . Aortic Stenosis   History of Present Illness:   Jeanne Sims is a 64 y.o. female with a hx of aortic stenosis, DM, CVA, hypertension who is being seen today for the evaluation of aortic stenosis at the request of Hague, Imran P, MD.  She was recently found to have a murmur by her primary care physician.  She actually was admitted to the hospital in February with COVID-19.  Her echocardiogram at that time demonstrated mild aortic stenosis.  Her echocardiogram repeated at her primary care physician office in June of this year also demonstrates mild aortic stenosis.  She had no appreciable stenosis in her carotid arteries.  She has a longstanding history of diabetes and hypertension.  This appears to be well controlled on medications.  She reports she was given a prescription for metoprolol in the past due to mitral valve prolapse.  She apparently has had episodes of dizziness and palpitations.  These can occur 2-3 times per week.  They have occurred for about 6 months.  She reports she can be sitting or walking and just become dizzy.  She reports that she feels her heart racing at times.  Symptoms last 2 to 3 minutes and resolve without any syncope.  She is not passed out per her report.  She denies any chest pain or shortness of breath with her current level of activity.  She is still getting over coronavirus and reports some fatigue but no overt symptoms.  Her diabetes is well controlled.  Her most recent LDL cholesterol 72.  She remains on Eliquis regarding a DVT/PE she had with coronavirus in February of this year.  She has not followed up on her symptoms.   Problem List 1. Mild to moderate aortic stenosis/mild  AI -02/2019: MG 11.3 mmHG, Vmax 2.3 m/s -tricuspid AoV 2. DM -A1c 7.2 -Total cholesterol 134, HDL 35, LDL 72, triglycerides 135 3. Hypertension 4. DVT/PE -02/2019 2/2 covid 5. CVA -02/2019 in setting of covid   Past Medical History: Past Medical History:  Diagnosis Date  . Acquired deafness   . Cervical cancer ([place]) 1075  . Cervical cancer ([place])   . CVA (cerebral vascular accident) ([place]) 02/2019  . Diabetes mellitus without complication ([place])   . Dyspareunia   . GERD (gastroesophageal reflux disease)   . Heart murmur   . Hodgkin disease ([place]) 1987  . Ovarian cancer [facility])    age 35  . Psoriasis   . Thyroid disease     Past Surgical History: Past Surgical History:  Procedure Laterality Date  . ABDOMINAL HYSTERECTOMY     TAH/BSO at age 65 d/t cervical CA  . BASAL CELL CARCINOMA EXCISION     face  . BOWEL RESECTION     followed splenectomy.  had bowel obstruction.  . COCHLEAR IMPLANT  1983   Dr. Thornell Mule  . SPLENECTOMY     a/w Hodgkin's lymphoma    Current Medications: Current Meds  Medication Sig  . albuterol (VENTOLIN HFA) 108 (90 Base) MCG/ACT inhaler Inhale 2 puffs into the lungs every 6 (six) hours as needed.  [name] aspirin EC 81 MG tablet Take 1 tablet (81  mg total) by mouth daily. Swallow whole.  . budesonide-formoterol (SYMBICORT) 160-4.5 MCG/ACT inhaler Inhale 2 puffs into the lungs in the morning and at bedtime.  . Cyanocobalamin 1000 MCG/ML KIT Inject as directed every 30 (thirty) days.  [name] dexlansoprazole (DEXILANT) 60 MG capsule Take 60 mg by mouth daily.  . divalproex (DEPAKOTE ER) 250 MG 24 hr tablet Take 1 tablet (250 mg total) by mouth at bedtime.  . Dulaglutide (TRULICITY) 1.5 CZ/6.6AY SOPN Inject 1.5 mg into the skin once a week.   . ferrous sulfate 325 (65 FE) MG tablet Take 1 tablet (325 mg total) by mouth daily with breakfast. Please take with a source of vitamin C (orange juice or tablet with 500 units of Vitamin C).  . folic acid (FOLVITE) 301 MCG  tablet Take 800 mcg by mouth daily.  [name] gabapentin (NEURONTIN) 100 MG capsule Take 1 capsule (100 mg total) by mouth at bedtime.  [name] levothyroxine (EUTHYROX) 175 MCG tablet Take 175 mcg by mouth daily before breakfast.  . metFORMIN (GLUCOPHAGE) 500 MG tablet Take 500 mg by mouth 2 (two) times daily with a meal.  . metoprolol succinate (TOPROL-XL) 25 MG 24 hr tablet Take 12.5 mg by mouth daily.   . rosuvastatin (CRESTOR) 20 MG tablet Take 1 tablet (20 mg total) by mouth daily.  . Vitamin D, Ergocalciferol, (DRISDOL) 1.25 MG (50000 UT) CAPS capsule Take 50,000 Units by mouth every 7 (seven) days.     Allergies:    Patient has no known allergies.   Social History: Social History   Socioeconomic History  . Marital status: Married    Spouse name: Not on file  . Number of children: 1  . Years of education: Not on file  . Highest education level: Not on file  Occupational History  . Not on file  Tobacco Use  . Smoking status: Former Smoker    Packs/day: 1.00    Years: 20.00    Pack years: 20.00    Types: Cigarettes    Quit date: 08/2000    Years since quitting: 19.0  . Smokeless tobacco: Never Used  Vaping Use  . Vaping Use: Never used  Substance and Sexual Activity  . Alcohol use: No  . Drug use: No  . Sexual activity: Never    Comment: TAH/BSO  Other Topics Concern  . Not on file  Social History Narrative  . Not on file   Social Determinants of Health   Financial Resource Strain:   . Difficulty of Paying Living Expenses:   Food Insecurity:   . Worried About Charity fundraiser in the Last Year:   . Arboriculturist in the Last Year:   Transportation Needs:   . Film/video editor (Medical):   [name] Lack of Transportation (Non-Medical):   Physical Activity:   . Days of Exercise per Week:   . Minutes of Exercise per Session:   Stress:   . Feeling of Stress :   Social Connections:   . Frequency of Communication with Friends and Family:   . Frequency of Social  Gatherings with Friends and Family:   . Attends Religious Services:   . Active Member of Clubs or Organizations:   . Attends Archivist Meetings:   [name] Marital Status:      Family History: The patient's family history includes Breast cancer in her maternal aunt and paternal uncle; Cervical cancer in her sister and sister; Diabetes in her brother, brother, mother, sister, sister, and  sister; Heart attack in her brother; Heart disease in her mother; Stroke in her mother.  ROS:   All other ROS reviewed and negative. Pertinent positives noted in the HPI.     EKGs/Labs/Other Studies Reviewed:   The following studies were personally reviewed by me today:  EKG:  EKG is  ordered today.  The ekg ordered today demonstrates normal sinus rhythm, heart rate 75, no acute ST-T changes, no evidence of prior infarction, and was personally reviewed by me.   TTE 02/21/2019  1. Left ventricular ejection fraction, by estimation, is 60 to 65%. The  left ventricle has normal function. The left ventricle has no regional  wall motion abnormalities. Left ventricular diastolic function could not  be evaluated.  2. Right ventricular systolic function is normal. The right ventricular  size is normal.  3. The mitral valve is degenerative. Trivial mitral valve regurgitation.  4. The aortic valve is tricuspid. Aortic valve regurgitation is mild.  Mild aortic valve stenosis.   Recent Labs: 10/27/2018: TSH <0.080 03/01/2019: Magnesium 2.0 03/23/2019: ALT 17 06/11/2019: BUN 14; Creatinine, Ser 0.85; Hemoglobin 13.4; Platelets 518; Potassium 3.7; Sodium 137   Recent Lipid Panel    Component Value Date/Time   CHOL 134 02/24/2019 0500   TRIG 135 02/24/2019 0500   HDL 35 (L) 02/24/2019 0500   CHOLHDL 3.8 02/24/2019 0500   VLDL 27 02/24/2019 0500   LDLCALC 72 02/24/2019 0500    Physical Exam:   VS:  BP 122/68   Pulse 75   Ht _0  (1.676 m)   Wt 184 lb (83.5 kg)   BMI 29.70 kg/m    Wt Readings  from Last 3 Encounters:  08/04/19 184 lb (83.5 kg)  07/05/19 186 lb (84.4 kg)  06/14/19 185 lb (83.9 kg)    General: Well nourished, well developed, in no acute distress Heart: Atraumatic, normal size  Eyes: PEERLA, EOMI  Neck: Supple, no JVD Endocrine: No thryomegaly Cardiac: Normal S1, S2; RRR; 2 out of 6 systolic ejection murmur, radiates into carotids Lungs: Clear to auscultation bilaterally, no wheezing, rhonchi or rales  Abd: Soft, nontender, no hepatomegaly  Ext: No edema, pulses 2+ Musculoskeletal: No deformities, BUE and BLE strength normal and equal Skin: Warm and dry, no rashes   Neuro: Alert and oriented to person, place, time, and situation, CNII-XII grossly intact, no focal deficits  Psych: Normal mood and affect   ASSESSMENT:   Jeanne Sims is a 65 y.o. female who presents for the following: 1. Nonrheumatic aortic valve stenosis   2. Dizziness   3. Palpitations   4. Essential hypertension     PLAN:   1. Nonrheumatic aortic valve stenosis -Mild aortic stenosis.  Symptoms of dizziness and palpitations are not explained by this.  We will recheck an echocardiogram in 3 to 5 years.  She is far away from any need for intervention.  2. Dizziness 3. Palpitations -Episodes of dizziness palpitations.  Unclear etiology here.  Could be arrhythmia.  Her EKG is normal today.  She is on thyroid supplementation reports this has been checked by her primary care physician and does not need to be changed.  I would like for her to wear a 7-day ZIO patch to exclude any significant arrhythmia.  I would like for her to remain well-hydrated.  This could just be orthostasis.  She does report that it occurs with position change.  We will see how she does and I will see her back in 3 months.  4.  Essential hypertension -Well-controlled.  Disposition: Return in about 3 months (around 11/04/2019).  Medication Adjustments/Labs and Tests Ordered: Current medicines are reviewed at length  with the patient today.  Concerns regarding medicines are outlined above.  Orders Placed This Encounter  Procedures  . LONG TERM MONITOR (3-14 DAYS)  . EKG 12-Lead   No orders of the defined types were placed in this encounter.   Patient Instructions  Medication Instructions:  The current medical regimen is effective;  continue present plan and medications.  *If you need a refill on your cardiac medications before your next appointment, please call your pharmacy*   Testing/Procedures: Your physician has recommended that you wear a 14 DAY ZIO-PATCH monitor. The Zio patch cardiac monitor continuously records heart rhythm data for up to 14 days, this is for patients being evaluated for multiple types heart rhythms. For the first 24 hours post application, please avoid getting the Zio monitor wet in the shower or by excessive sweating during exercise. After that, feel free to carry on with regular activities. Keep soaps and lotions away from the ZIO XT Patch.  This will be mailed to you, please expect 7-10 days to receive.    Applying the monitor   Shave hair from upper left chest.   Hold abrader disc by orange tab.  Rub abrader in 40 strokes over left upper chest as indicated in your monitor instructions.   Clean area with 4 enclosed alcohol pads .  Use all pads to assure are is cleaned thoroughly.  Let dry.   Apply patch as indicated in monitor instructions.  Patch will be place under collarbone on left side of chest with arrow pointing upward.   Rub patch adhesive wings for 2 minutes.Remove white label marked "1".  Remove white label marked "2".  Rub patch adhesive wings for 2 additional minutes.   While looking in a mirror, press and release button in center of patch.  A small green light will flash 3-4 times .  This will be your only indicator the monitor has been turned on.     Do not shower for the first 24 hours.  You may shower after the first 24 hours.   Press button if you  feel a symptom. You will hear a small click.  Record Date, Time and Symptom in the Patient Log Book.   When you are ready to remove patch, follow instructions on last 2 pages of Patient Log Book.  Stick patch monitor onto last page of Patient Log Book.   Place Patient Log Book in [place] box.  Use locking tab on box and tape box closed securely.  The [employer] has [employer] on it.  Please place in mailbox as soon as possible.  Your physician should have your test results approximately 7 days after the monitor has been mailed back to [facility].   Call [place] at [phone] if you have questions regarding your ZIO XT patch monitor.  Call them immediately if you see an orange light blinking on your monitor.   If your monitor falls off in less than 4 days contact our Monitor department at [phone].  If your monitor becomes loose or falls off after 4 days call [employer] at [phone] for suggestions on securing your monitor     Follow-Up: At [facility], you and your health needs are our priority.  As part of our continuing mission to provide you with exceptional heart care, we have created designated Provider  Care Teams.  These Care Teams include your primary Cardiologist (physician) and Advanced Practice Providers (APPs -  Physician Assistants and Nurse Practitioners) who all work together to provide you with the care you need, when you need it.  We recommend signing up for the patient portal called "MyChart".  Sign up information is provided on this After Visit Summary.  MyChart is used to connect with patients for Virtual Visits (Telemedicine).  Patients are able to view lab/test results, encounter notes, upcoming appointments, etc.  Non-urgent messages can be sent to your provider as well.   To learn more about what you can do with MyChart, go to [email or web address].    Your next appointment:   3 month(s)  The format for your next  appointment:   In Person  Provider:   Eleonore Chiquito, MD        Signed, Addison Naegeli. Audie Box, [place]  [address] [place], [place] [address] [phone]  08/04/2019 5:01 PM

## 2019-08-04 ENCOUNTER — Encounter: Payer: Self-pay | Admitting: Cardiovascular Disease

## 2019-08-04 ENCOUNTER — Other Ambulatory Visit: Payer: Self-pay

## 2019-08-04 ENCOUNTER — Ambulatory Visit: Payer: Commercial Managed Care - PPO | Admitting: Cardiovascular Disease

## 2019-08-04 VITALS — BP 122/68 | HR 75 | Ht 66.0 in | Wt 184.0 lb

## 2019-08-04 DIAGNOSIS — I350 Nonrheumatic aortic (valve) stenosis: Secondary | ICD-10-CM

## 2019-08-04 DIAGNOSIS — R42 Dizziness and giddiness: Secondary | ICD-10-CM | POA: Diagnosis not present

## 2019-08-04 DIAGNOSIS — I10 Essential (primary) hypertension: Secondary | ICD-10-CM

## 2019-08-04 DIAGNOSIS — R002 Palpitations: Secondary | ICD-10-CM | POA: Diagnosis not present

## 2019-08-04 NOTE — Patient Instructions (Signed)
Medication Instructions:  The current medical regimen is effective;  continue present plan and medications.  *If you need a refill on your cardiac medications before your next appointment, please call your pharmacy*   Testing/Procedures: Your physician has recommended that you wear a 14 DAY ZIO-PATCH monitor. The Zio patch cardiac monitor continuously records heart rhythm data for up to 14 days, this is for patients being evaluated for multiple types heart rhythms. For the first 24 hours post application, please avoid getting the Zio monitor wet in the shower or by excessive sweating during exercise. After that, feel free to carry on with regular activities. Keep soaps and lotions away from the ZIO XT Patch.  This will be mailed to you, please expect 7-10 days to receive.    Applying the monitor   Shave hair from upper left chest.   Hold abrader disc by orange tab.  Rub abrader in 40 strokes over left upper chest as indicated in your monitor instructions.   Clean area with 4 enclosed alcohol pads .  Use all pads to assure are is cleaned thoroughly.  Let dry.   Apply patch as indicated in monitor instructions.  Patch will be place under collarbone on left side of chest with arrow pointing upward.   Rub patch adhesive wings for 2 minutes.Remove white label marked "1".  Remove white label marked "2".  Rub patch adhesive wings for 2 additional minutes.   While looking in a mirror, press and release button in center of patch.  A small green light will flash 3-4 times .  This will be your only indicator the monitor has been turned on.     Do not shower for the first 24 hours.  You may shower after the first 24 hours.   Press button if you feel a symptom. You will hear a small click.  Record Date, Time and Symptom in the Patient Log Book.   When you are ready to remove patch, follow instructions on last 2 pages of Patient Log Book.  Stick patch monitor onto last page of Patient Log Book.    Place Patient Log Book in [place] box.  Use locking tab on box and tape box closed securely.  The [employer] has [employer] on it.  Please place in mailbox as soon as possible.  Your physician should have your test results approximately 7 days after the monitor has been mailed back to [facility].   Call [place] at [phone] if you have questions regarding your ZIO XT patch monitor.  Call them immediately if you see an orange light blinking on your monitor.   If your monitor falls off in less than 4 days contact our Monitor department at [phone].  If your monitor becomes loose or falls off after 4 days call [employer] at [phone] for suggestions on securing your monitor     Follow-Up: At [facility], you and your health needs are our priority.  As part of our continuing mission to provide you with exceptional heart care, we have created designated Provider Care Teams.  These Care Teams include your primary Cardiologist (physician) and Advanced Practice Providers (APPs -  Physician Assistants and Nurse Practitioners) who all work together to provide you with the care you need, when you need it.  We recommend signing up for the patient portal called "MyChart".  Sign up information is provided on this After Visit Summary.  MyChart is used to connect with patients for Virtual Visits (  Telemedicine).  Patients are able to view lab/test results, encounter notes, upcoming appointments, etc.  Non-urgent messages can be sent to your provider as well.   To learn more about what you can do with MyChart, go to [email or web address].    Your next appointment:   3 month(s)  The format for your next appointment:   In Person  Provider:   Eleonore Chiquito, MD

## 2019-08-09 ENCOUNTER — Ambulatory Visit (INDEPENDENT_AMBULATORY_CARE_PROVIDER_SITE_OTHER): Payer: Commercial Managed Care - PPO

## 2019-08-09 DIAGNOSIS — R002 Palpitations: Secondary | ICD-10-CM

## 2019-08-16 ENCOUNTER — Telehealth: Payer: Self-pay | Admitting: Adult Health

## 2019-08-16 NOTE — Telephone Encounter (Signed)
Pt would like a call from the nurse. Would like to discuss why I would like a appt with the physician instead of the NP for my follow up.

## 2019-08-17 ENCOUNTER — Ambulatory Visit: Payer: Commercial Managed Care - PPO | Admitting: Hematology and Oncology

## 2019-08-17 ENCOUNTER — Other Ambulatory Visit: Payer: Commercial Managed Care - PPO

## 2019-08-17 NOTE — Telephone Encounter (Signed)
Please schedule her to follow-up with Dr. Leonie Man

## 2019-08-17 NOTE — Telephone Encounter (Signed)
Called and LMVM for pt to return call, can see provider, Dr Leonie Man here every other week, can be placed in next available.  Left # to call back.

## 2019-08-17 NOTE — Telephone Encounter (Signed)
I called pt and she is wanting to see MD, Dr. Leonie Man or other provider about the episodes that she continues to have.  The one yesterday lasted for about 10 min, she could hardly stand up, she felt lightheaded like she would pass out, but she did not.  She has dizziness, double vision,  Her optometrist eye exam recently was [facility] at [facility], just change in glasses.  These episodes are sporadic.  She is wearing cardiac event monitor to come off tomorrow.  Taking gabapentin but had SE from depakote and is not taking.  I relayed that per JM/NP note that if episodes continued to get CT and EEG and I will relay to her.  She still wanted to see MD.  Will message her back on mychart.

## 2019-08-17 NOTE — Telephone Encounter (Signed)
LMVM for pt wk # that returned call.

## 2019-08-17 NOTE — Telephone Encounter (Signed)
appt saved 240pm 08-18-19 with Dr. Leonie Man.

## 2019-08-17 NOTE — Telephone Encounter (Signed)
Pt returned call. Pt stated she would like to be called at the work number: [phone]

## 2019-08-18 ENCOUNTER — Other Ambulatory Visit: Payer: Self-pay

## 2019-08-18 ENCOUNTER — Ambulatory Visit (INDEPENDENT_AMBULATORY_CARE_PROVIDER_SITE_OTHER): Payer: Commercial Managed Care - PPO | Admitting: Neurology

## 2019-08-18 ENCOUNTER — Encounter: Payer: Self-pay | Admitting: Neurology

## 2019-08-18 VITALS — BP 135/74 | HR 89 | Ht 66.0 in | Wt 185.2 lb

## 2019-08-18 DIAGNOSIS — R42 Dizziness and giddiness: Secondary | ICD-10-CM | POA: Diagnosis not present

## 2019-08-18 MED ORDER — CLOPIDOGREL BISULFATE 75 MG PO TABS: 75.0000 mg | ORAL_TABLET | Freq: Every day | ORAL | 11 refills | Status: AC

## 2019-08-18 MED ORDER — TOPIRAMATE 25 MG PO TABS: 25.0000 mg | ORAL_TABLET | Freq: Two times a day (BID) | ORAL | 3 refills | Status: DC

## 2019-08-18 NOTE — Patient Instructions (Signed)
I had a long discussion with the patient and her husband regarding her recurrent stereotypical transient episodes of dizziness, blurred vision diplopia and ataxia being of unclear etiology possibilities include vertebrobasilar TIAs versus atypical migraine and doubt these are seizures.  Recent neurovascular imaging has been unyielding.  I recommend she add Plavix to aspirin for 3 weeks and then stop aspirin and stay on Plavix alone for stroke prevention and maintain aggressive risk factor modification with strict control of hypertension with blood pressure goal below 130/90, lipids with LDL cholesterol goal below 70 mg percent and diabetes with hemoglobin A1c goal below 6.5%.[name]  Check EEG.  Trial of Topamax 25 mg twice daily to increase as tolerated if no side effects.  She will return for follow-up in 3 months or call earlier if necessary.

## 2019-08-18 NOTE — Progress Notes (Signed)
[facility] [address] [place]. [place] [address] [phone]       OFFICE FOLLOW UP NOTE  [name] Date of Birth:  [birth year] Medical Record Number:  [id number]   Reason for Referral: Dizziness and diplopia accompanied by headache    CHIEF COMPLAINT:  Chief Complaint  Patient presents with  . Dizziness    rm 1 FU  husbandLinna Sims    HPI:   Update 06/14/2019, Jeanne Sims is being seen today for new symptoms onset/concerns.  She presented to ED on 06/11/2019 with symptom onset 1.5 weeks having 3 episodes with sudden onset of dizziness accompanied by headache and double vision lasting approximately 2 to 3 minutes then resolved.  No other focal deficits except baseline RLE weakness from prior stroke.  CTA head/neck unremarkable.  Unable to obtain MRI due to cochlear implants. She reports onset of lightheadedness/dizziness with presyncopal feeling and then will develop double vision and blurred vision bilaterally and then will have onset of occipital headache. presyncopal episode will last 2-3 minutes; vision symptoms can last 5-15 minutes at times but occipital headache typically lasts longer. She denies photophobia, phonophobia and n/v. She does prior history of migraines when she was in her 45s but "grew out of them". Denies any other symptoms such as worsening weakness, imbalance, speech impairment,  numbness/tingling or radiating headache pain.  Has not established aggravating factor such as position change or head movements.  Chronic hearing concerns with cochlear implants which have been stable without worsening. Denies actual loss of consciousness, racing heart, shortness or breath or chest pain. She did have recent f/u with pulmonology but no mention about discontinuing Eliquis therefore she has continued at this time. Blood pressure monitored at home and has been stable.   No concerns from a stroke standpoint. Mild right hemiparesis stable without worsening.  Continues on eliquis and crestor for secondary stroke prevention. Blood pressure today 122/82. Glucose level stable per patient. No stroke related concerns at this time.      History provided for reference purposes only Initial visit 04/05/2019, Jeanne Sims is being seen for hospital follow-up accompanied by her husband.  She does report ongoing recovery with residual mild right hemiparesis and RLE numbness/tingling with increased pain at night.  Continues to work with outpatient therapies.  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  Continues on Eliquis and Crestor for secondary stroke prevention without side effects.  Blood pressure today 133/65.  Glucose levels stable with recent A1c 6.9.  No further concerns at this time.  Stroke admission 02/20/2019: Jeanne Sims is a 64 y.o. female with history of , cervical cancer, pulmonary embolism, COVID-19 dx 3 weeks ago presented on 02/20/2019, with SOB found to have B PEs. Started on [facility]. In hospital developed altered sensation in R leg and RUE weakness.  Stroke work-up revealed likely small left subcortical infarct secondary to small vessel disease source patient with acute Covid infection and hypercoagulability with PE on Eliquis. All other imaging unremarkable.  Did not recommend further work-up at that time as treatment plan would not change.  History of HTN stable.  LDL 72 with continuation of Crestor.  Uncontrolled DM with A1c 7.2 with close PCP follow-up.  No prior history of stroke.  Discharged home in stable condition with recommendation of home health therapies with residual right-sided weakness.  Stroke:  Likely small subcortical infarct secondary to small vessel disease source in pt w/ acute Covid infection hypercoagulability w/ PE  on Eliquis  CT head 2/16 no acute abnormality. Mild atrophy.   CTA head mild plaque IC atherosclerosis   CTA neck mild plaque aortic arch and ICAs  CT head 2/18 no acute infarct  2D Echo  EF 60-65%. No source of embolus   RLE dopplers neg  LDL 72  HgbA1c 7.2  Update 08/18/2019 : Patient is seen urgently today upon request from her and her husband.  She requested transfer of care from nurse practitioner to me.  She has had recurrent transient episodes of dizziness, imbalance, presyncopal feeling, double and blurred vision with occasional tingling in the left shoulder lasting about 10 to 15 minutes.  She denies specific headaches before during or after the episodes.  Vision does get distorted during this.  She is fully aware of her surroundings.  After the episode is over she denies feeling tired sleepy.  She is presently wearing a Zio patch to see if there is related to possible arrhythmias.  Frequency of the episodes is variable but may occur from twice a week to up to once every 2 3 weeks.  She was recently seen in the ER on 06/11/2019 and had CT angiogram of brain and neck which did not show significant large vessel stenosis or occlusion.  Brain imaging was limited due to bilateral cochlear implants.  MRI could not be done due to cochlear implants.  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  She does have remote history of migraine headaches while young but has outgrown them.  These episodes are quite different from them.  She is unable to identify specific triggers or relieving factors for these episodes.  She is presently wearing a Zio patch which will come off tomorrow.  She has no history of syncope or palpitations.  Patient has no other complaints today.    ROS:   14 system review of systems performed and negative with exception of dizziness, imbalance, blurred vision, double vision visual changes, and all other systems negative PMH:  Past Medical History:  Diagnosis Date  .  Acquired deafness   . Cervical cancer ([place]) 1075  . Cervical cancer ([place])   . CVA (cerebral vascular accident) ([place]) 02/2019  . Diabetes mellitus without complication ([place])   . Dyspareunia   . GERD (gastroesophageal reflux disease)   . Heart murmur   . Hodgkin disease ([place]) 1987  . Ovarian cancer [facility])    age 3  . Psoriasis   . Thyroid disease     PSH:  Past Surgical History:  Procedure Laterality Date  . ABDOMINAL HYSTERECTOMY     TAH/BSO at age 36 d/t cervical CA  . BASAL CELL CARCINOMA EXCISION     face  . BOWEL RESECTION     followed splenectomy.  had bowel obstruction.  . COCHLEAR IMPLANT  1983   Dr. Thornell Mule  . SPLENECTOMY     a/w Hodgkin's lymphoma    Social History:  Social History   Socioeconomic History  . Marital status: Married    Spouse name: Not on file  . Number of children: 1  . Years of education: Not on file  . Highest education level: Not on file  Occupational History  . Not on file  Tobacco Use  . Smoking status: Former Smoker    Packs/day:  1.00    Years: 20.00    Pack years: 20.00    Types: Cigarettes    Quit date: 08/2000    Years since quitting: 19.0  . Smokeless tobacco: Never Used  Vaping Use  . Vaping Use: Never used  Substance and Sexual Activity  . Alcohol use: No  . Drug use: No  . Sexual activity: Never    Comment: TAH/BSO  Other Topics Concern  . Not on file  Social History Narrative  . Not on file   Social Determinants of Health   Financial Resource Strain:   . Difficulty of Paying Living Expenses:   Food Insecurity:   . Worried About Charity fundraiser in the Last Year:   . Arboriculturist in the Last Year:   Transportation Needs:   . Film/video editor (Medical):   [name] Lack of Transportation (Non-Medical):   Physical Activity:   . Days of Exercise per Week:   . Minutes of Exercise per Session:   Stress:   . Feeling of Stress :   Social Connections:   . Frequency of Communication with Friends and Family:    . Frequency of Social Gatherings with Friends and Family:   . Attends Religious Services:   . Active Member of Clubs or Organizations:   . Attends Archivist Meetings:   [name] Marital Status:   Intimate Partner Violence:   . Fear of Current or Ex-Partner:   . Emotionally Abused:   [name] Physically Abused:   . Sexually Abused:     Family History:  Family History  Problem Relation Age of Onset  . Diabetes Mother   . Heart disease Mother   . Stroke Mother   . Diabetes Brother   . Heart attack Brother   . Diabetes Sister   . Cervical cancer Sister   . Diabetes Sister   . Cervical cancer Sister   . Diabetes Sister   . Diabetes Brother   . Breast cancer Paternal Uncle   . Breast cancer Maternal Aunt     Medications:   Current Outpatient Medications on File Prior to Visit  Medication Sig Dispense Refill  . aspirin EC 81 MG tablet Take 1 tablet (81 mg total) by mouth daily. Swallow whole. 30 tablet 11  . budesonide-formoterol (SYMBICORT) 160-4.5 MCG/ACT inhaler Inhale 2 puffs into the lungs in the morning and at bedtime. 1 Inhaler 11  . Cyanocobalamin 1000 MCG/ML KIT Inject as directed every 30 (thirty) days.    [name] dexlansoprazole (DEXILANT) 60 MG capsule Take 60 mg by mouth daily.    . Dulaglutide (TRULICITY) 1.5 IE/3.3IR SOPN Inject 1.5 mg into the skin once a week.     . ferrous sulfate 325 (65 FE) MG tablet Take 1 tablet (325 mg total) by mouth daily with breakfast. Please take with a source of vitamin C (orange juice or tablet with 500 units of Vitamin C). 90 tablet 3  . folic acid (FOLVITE) 518 MCG tablet Take 800 mcg by mouth daily.    [name] gabapentin (NEURONTIN) 100 MG capsule Take 1 capsule (100 mg total) by mouth at bedtime. 90 capsule 3  . levothyroxine (EUTHYROX) 175 MCG tablet Take 175 mcg by mouth daily before breakfast.    . metFORMIN (GLUCOPHAGE) 500 MG tablet Take 500 mg by mouth 2 (two) times daily with a meal.    . rosuvastatin (CRESTOR) 20 MG tablet Take 1  tablet (20 mg total) by mouth daily. 30 tablet 0  .  Vitamin D, Ergocalciferol, (DRISDOL) 1.25 MG (50000 UT) CAPS capsule Take 50,000 Units by mouth every 7 (seven) days.    [name] albuterol (VENTOLIN HFA) 108 (90 Base) MCG/ACT inhaler Inhale 2 puffs into the lungs every 6 (six) hours as needed. (Patient not taking: Reported on 08/18/2019) 18 g 11  . apixaban (ELIQUIS) 5 MG TABS tablet Take 2 tablets (10 mg total) by mouth 2 (two) times daily for 5 days. LAST DOSE ON 03/01/19-BEFORE SWITCHING TO 5 MG TWICE DAILY ON 2/24 (Patient taking differently: Take 10 mg by mouth 2 (two) times daily. TAKE 2 TABLETS (10 MG TOTALLY) BY MOUTH TWICE DAILY UNTIL 03/29/2019; THEN TAKE 1 TABLET (5 MG TOTALLY) BY MOUTH TWICE DAILY ON 03/02/2019) 60 tablet 0  . divalproex (DEPAKOTE ER) 250 MG 24 hr tablet Take 1 tablet (250 mg total) by mouth at bedtime. (Patient not taking: Reported on 08/18/2019) 90 tablet 3  . metoprolol succinate (TOPROL-XL) 25 MG 24 hr tablet Take 12.5 mg by mouth daily.  (Patient not taking: Reported on 08/18/2019)     No current facility-administered medications on file prior to visit.    Allergies:  No Known Allergies   Physical Exam  Vitals:   08/18/19 1421  BP: 135/74  Pulse: 89  Weight: 185 lb 3.2 oz (84 kg)  Height: _0  (1.676 m)   Body mass index is 29.89 kg/m. No exam data present  No flowsheet data found.   General: Mildly obese middle-aged Caucasian lady, pleasant middle-age Caucasian female, seated, in no evident distress Head: head normocephalic and atraumatic.   Neck: supple with no carotid or supraclavicular bruits Cardiovascular: regular rate and rhythm, no murmurs Musculoskeletal: no deformity Skin:  no rash/petichiae Vascular:  Normal pulses all extremities   Neurologic Exam Mental Status: Awake and fully alert.   Normal speech and language.  Oriented to place and time. Recent and remote memory intact. Attention span, concentration and fund of knowledge appropriate.  Mood and affect appropriate.  Cranial Nerves: Fundoscopic exam not done pupils equal, briskly reactive to light. Extraocular movements full without nystagmus. Visual fields full to confrontation. HOH with cochlear implants.  Mild right lower facial weakness. Motor: Normal bulk and tone.  Full strength left upper and lower extremity and right sided weakness of grip intrinsic hand muscles and orbits left over right upper extremity. Sensory.:  Decreased vibratory and light touch sensation proximal RLE and distal RUE Coordination: Rapid alternating movements normal in all extremities except mildly decreased right hand. Finger-to-nose and heel-to-shin performed accurately bilaterally. Gait and Station: Arises from chair without difficulty. Stance is normal. Mild favoring of right lower extremity 2/2 weakness with mild imbalance. Mild difficulty with tandem walk. Romberg negative  Reflexes: 1+ and symmetric. Toes downgoing.        ASSESSMENT: ARLANDA SHIPLETT is a 64 y.o. year old female hospitalized on 02/20/2019 due to shortness of breath in setting of COVID-19 infection and bilateral PEs.  During admission on 02/24/2019, developed altered sensation in right leg and RUE weakness with stroke work-up revealing likely small left subcortical infarct unable to visualize on imaging secondary to small vessel disease. Vascular risk factors include HTN, HLD, DM, bilateral PEs (provoked).   She has been having recurrent episodes of transient dizziness, double and blurred vision as well as gait ataxia lasting 10 to 15 minutes occurring once or twice a month etiology unclear vertebrobasilar TIA versus atypical migraine.  Doubt seizures.  She has not been able to tolerate Depakote due to side effects.  PLAN:  I had a long discussion with the patient and her husband regarding her recurrent stereotypical transient episodes of dizziness, blurred vision diplopia and ataxia being of unclear etiology possibilities include  vertebrobasilar TIAs versus atypical migraine and doubt these are seizures.  Recent neurovascular imaging has been unyielding.  I recommend she add Plavix to aspirin for 3 weeks and then stop aspirin and stay on Plavix alone for stroke prevention and maintain aggressive risk factor modification with strict control of hypertension with blood pressure goal below 130/90, lipids with LDL cholesterol goal below 70 mg percent and diabetes with hemoglobin A1c goal below 6.5%.[name]  Check EEG.  Trial of Topamax 25 mg twice daily to increase as tolerated if no side effects.  She will return for follow-up in 3 months or call earlier if necessary.needed  I spent 30 minutes of face-to-face and non-face-to-face time with patient and husband.  This included previsit chart review, lab review, study review, order entry, electronic health record documentation, patient education in regards to new onset symptoms and possible etiology, use of Topamax and Plavix and possible side effects, continue management of secondary stroke risk factors and answered all questions to patient and husband satisfaction   Frann Rider, AGNP-BC  [facility] [address] [place], [place] [address]  Phone [phone] Fax [phone] Note: This document was prepared with digital dictation and possible smart phrase technology. Any transcriptional errors that result from this process are unintentional.

## 2019-08-23 NOTE — Telephone Encounter (Signed)
I called pt back relating to her most recent mychart message.  Took both plavix and topamax , had vomiting, balance issues, abd pain stopped both.  Was better after a day of resting.  Restarted plavix now for 2 days and is feeling better.  No dizziness or diploplia.  (these episodes happen maybe every 2 wks).  She will hold off on taking any other medication at this time.  Will continue the plavix, have the EEG done on 09-07-19 and then will go from there.  She will call us back as needed.

## 2019-09-07 ENCOUNTER — Ambulatory Visit (INDEPENDENT_AMBULATORY_CARE_PROVIDER_SITE_OTHER): Payer: Commercial Managed Care - PPO | Admitting: Neurology

## 2019-09-07 DIAGNOSIS — R55 Syncope and collapse: Secondary | ICD-10-CM

## 2019-09-07 DIAGNOSIS — R42 Dizziness and giddiness: Secondary | ICD-10-CM

## 2019-09-14 NOTE — Progress Notes (Signed)
Kindly inform the patient that EEG study was normal

## 2019-09-29 ENCOUNTER — Ambulatory Visit: Payer: Commercial Managed Care - PPO | Admitting: Adult Health

## 2019-10-05 ENCOUNTER — Other Ambulatory Visit: Payer: Self-pay | Admitting: Gastroenterology

## 2019-10-05 DIAGNOSIS — R471 Dysarthria and anarthria: Secondary | ICD-10-CM

## 2019-10-17 ENCOUNTER — Ambulatory Visit
Admission: RE | Admit: 2019-10-17 | Discharge: 2019-10-17 | Disposition: A | Discharge status: Home / Self Care | Payer: Commercial Managed Care - PPO | Source: Ambulatory Visit | Attending: Gastroenterology | Admitting: Gastroenterology

## 2019-10-17 ENCOUNTER — Other Ambulatory Visit: Payer: Self-pay | Admitting: Gastroenterology

## 2019-10-17 DIAGNOSIS — R471 Dysarthria and anarthria: Secondary | ICD-10-CM

## 2019-11-07 ENCOUNTER — Ambulatory Visit: Payer: Commercial Managed Care - PPO | Admitting: Cardiovascular Disease

## 2019-11-08 NOTE — Progress Notes (Signed)
Cardiology Office Note:   Date:  11/09/2019  NAME:  Jeanne Sims    MRN: [id number] DOB:  04/26/1955   PCP:  Bonnita Nasuti, MD  Cardiologist:  No primary care provider on file.   Referring MD: Bonnita Nasuti, MD   Chief Complaint  Patient presents with  . Follow-up   History of Present Illness:   Jeanne Sims is a 64 y.o. female with a hx of Mild AS, DM, HTN who presents for follow-up. Was seen for dizzy spells and palpitations. Monitor showed brief SVT.  She reports he is doing well since her last visit.  We did discuss her monitor in office.  Had brief SVT episodes that lasted less than 8 seconds.  She reports no further symptoms.  We stopped her metoprolol.  She is having no issues with this.  She still has a murmur of mild to moderate aortic stenosis.  Echocardiogram will need to be repeated in 2 to 3 years.  She denies any chest pain or shortness of breath.  She not having any fainting spells per her report.  She was started on Plavix and apparently her dizziness is improved.  She will follow with neurology.  Most recent LDL cholesterol 72.  She remains on Crestor 20 mg.  Most recent A1c is 7.2.  She will have this rechecked by her primary care physician.  Problem List 1. 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 DM -A1c 7.2 -Total cholesterol 134, HDL 35, LDL 72, triglycerides 135 3. Hypertension 4. DVT/PE -02/2019 2/2 covid 5. CVA -02/2019 in setting of covid   Past Medical History: Past Medical History:  Diagnosis Date  . Acquired deafness   . Cervical cancer ([place]) 1075  . Cervical cancer ([place])   . CVA (cerebral vascular accident) ([place]) 02/2019  . Diabetes mellitus without complication ([place])   . Dyspareunia   . GERD (gastroesophageal reflux disease)   . Heart murmur   . Hodgkin disease ([place]) 1987  . Ovarian cancer [facility])    age 43  . Psoriasis   . Thyroid disease     Past Surgical History: Past Surgical  History:  Procedure Laterality Date  . ABDOMINAL HYSTERECTOMY     TAH/BSO at age 19 d/t cervical CA  . BASAL CELL CARCINOMA EXCISION     face  . BOWEL RESECTION     followed splenectomy.  had bowel obstruction.  . COCHLEAR IMPLANT  1983   Dr. Thornell Mule  . SPLENECTOMY     a/w Hodgkin's lymphoma    Current Medications: Current Meds  Medication Sig  . albuterol (VENTOLIN HFA) 108 (90 Base) MCG/ACT inhaler Inhale 2 puffs into the lungs every 6 (six) hours as needed.  . clopidogrel (PLAVIX) 75 MG tablet Take 1 tablet (75 mg total) by mouth daily.  . Cyanocobalamin 1000 MCG/ML KIT Inject as directed every 30 (thirty) days.  [name] dexlansoprazole (DEXILANT) 60 MG capsule Take 60 mg by mouth daily.  . Dulaglutide (TRULICITY) 1.5 XN/1.7GY SOPN Inject 1.5 mg into the skin once a week.   . ferrous sulfate 325 (65 FE) MG tablet Take 1 tablet (325 mg total) by mouth daily with breakfast. Please take with a source of vitamin C (orange juice or tablet with 500 units of Vitamin C).  . folic acid (FOLVITE) 174 MCG tablet Take 800 mcg by mouth daily.  [name] gabapentin (NEURONTIN) 100 MG capsule Take 1 capsule (100 mg total) by  mouth at bedtime.  [name] levothyroxine (EUTHYROX) 175 MCG tablet Take 175 mcg by mouth daily before breakfast.  . metFORMIN (GLUCOPHAGE) 500 MG tablet Take 500 mg by mouth 2 (two) times daily with a meal.  . rosuvastatin (CRESTOR) 20 MG tablet Take 1 tablet (20 mg total) by mouth daily.  . Vitamin D, Ergocalciferol, (DRISDOL) 1.25 MG (50000 UT) CAPS capsule Take 50,000 Units by mouth every 7 (seven) days.  . [DISCONTINUED] aspirin EC 81 MG tablet Take 1 tablet (81 mg total) by mouth daily. Swallow whole.  . [DISCONTINUED] budesonide-formoterol (SYMBICORT) 160-4.5 MCG/ACT inhaler Inhale 2 puffs into the lungs in the morning and at bedtime.  . [DISCONTINUED] divalproex (DEPAKOTE ER) 250 MG 24 hr tablet Take 1 tablet (250 mg total) by mouth at bedtime.  . [DISCONTINUED] metoprolol succinate  (TOPROL-XL) 25 MG 24 hr tablet Take 12.5 mg by mouth daily.   . [DISCONTINUED] topiramate (TOPAMAX) 25 MG tablet Take 1 tablet (25 mg total) by mouth 2 (two) times daily.     Allergies:    Depakote [divalproex sodium] and Topamax [topiramate]   Social History: Social History   Socioeconomic History  . Marital status: Married    Spouse name: Not on file  . Number of children: 1  . Years of education: Not on file  . Highest education level: Not on file  Occupational History  . Not on file  Tobacco Use  . Smoking status: Former Smoker    Packs/day: 1.00    Years: 20.00    Pack years: 20.00    Types: Cigarettes    Quit date: 08/2000    Years since quitting: 19.2  . Smokeless tobacco: Never Used  Vaping Use  . Vaping Use: Never used  Substance and Sexual Activity  . Alcohol use: No  . Drug use: No  . Sexual activity: Never    Comment: TAH/BSO  Other Topics Concern  . Not on file  Social History Narrative  . Not on file   Social Determinants of Health   Financial Resource Strain:   . Difficulty of Paying Living Expenses: Not on file  Food Insecurity:   . Worried About Charity fundraiser in the Last Year: Not on file  . Ran Out of Food in the Last Year: Not on file  Transportation Needs:   . Lack of Transportation (Medical): Not on file  . Lack of Transportation (Non-Medical): Not on file  Physical Activity:   . Days of Exercise per Week: Not on file  . Minutes of Exercise per Session: Not on file  Stress:   . Feeling of Stress : Not on file  Social Connections:   . Frequency of Communication with Friends and Family: Not on file  . Frequency of Social Gatherings with Friends and Family: Not on file  . Attends Religious Services: Not on file  . Active Member of Clubs or Organizations: Not on file  . Attends Archivist Meetings: Not on file  . Marital Status: Not on file     Family History: The patient's family history includes Breast cancer in her  maternal aunt and paternal uncle; Cervical cancer in her sister and sister; Diabetes in her brother, brother, mother, sister, sister, and sister; Heart attack in her brother; Heart disease in her mother; Stroke in her mother.  ROS:   All other ROS reviewed and negative. Pertinent positives noted in the HPI.     EKGs/Labs/Other Studies Reviewed:   The following studies were personally  reviewed by me today:   TTE 02/21/2019 1. Left ventricular ejection fraction, by estimation, is 60 to 65%. The  left ventricle has normal function. The left ventricle has no regional  wall motion abnormalities. Left ventricular diastolic function could not  be evaluated.  2. Right ventricular systolic function is normal. The right ventricular  size is normal.  3. The mitral valve is degenerative. Trivial mitral valve regurgitation.  4. The aortic valve is tricuspid. Aortic valve regurgitation is mild.  Mild aortic valve stenosis.   [place] 09/01/2019 1. 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  2. Brief ectopic atrial tachycardia episodes without symptoms. 2 episodes in 9 days with longest episode 8.9 seconds.  3. Symptoms occurred with normal sinus rhythm.  4. Rare ectopy.    Recent Labs: 03/01/2019: Magnesium 2.0 03/23/2019: ALT 17 06/11/2019: BUN 14; Creatinine, Ser 0.85; Hemoglobin 13.4; Platelets 518; Potassium 3.7; Sodium 137   Recent Lipid Panel    Component Value Date/Time   CHOL 134 02/24/2019 0500   TRIG 135 02/24/2019 0500   HDL 35 (L) 02/24/2019 0500   CHOLHDL 3.8 02/24/2019 0500   VLDL 27 02/24/2019 0500   LDLCALC 72 02/24/2019 0500    Physical Exam:   VS:  BP (!) 158/82   Pulse 86   Ht _0  (1.702 m)   Wt 181 lb (82.1 kg)   SpO2 98%   BMI 28.35 kg/m    Wt Readings from Last 3 Encounters:  11/09/19 181 lb (82.1 kg)  08/18/19 185 lb 3.2 oz (84 kg)  08/04/19 184 lb (83.5 kg)    General: Well nourished, well developed, in no acute  distress Heart: Atraumatic, normal size  Eyes: PEERLA, EOMI  Neck: Supple, no JVD Endocrine: No thryomegaly Cardiac: Normal S1, S2; RRR; 2 out of 6 systolic ejection murmur, radiates into the carotids Lungs: Clear to auscultation bilaterally, no wheezing, rhonchi or rales  Abd: Soft, nontender, no hepatomegaly  Ext: No edema, pulses 2+ Musculoskeletal: No deformities, BUE and BLE strength normal and equal Skin: Warm and dry, no rashes   Neuro: Alert and oriented to person, place, time, and situation, CNII-XII grossly intact, no focal deficits  Psych: Normal mood and affect   ASSESSMENT:   Jeanne Sims is a 64 y.o. female who presents for the following: 1. SVT (supraventricular tachycardia) (HCC)   2. Palpitations   3. Nonrheumatic aortic valve stenosis   4. Dizziness   5. Essential hypertension   6. Mixed hyperlipidemia     PLAN:   1. SVT (supraventricular tachycardia) (HCC) 2. Palpitations -Recent monitor with brief SVT episodes less than 8 seconds.  Had 2 episodes in a 9-day period.  I recommended no treatment.  Symptoms appear to have resolved.  She has infrequent symptoms that last seconds every few weeks.  3. Nonrheumatic aortic valve stenosis -She has mild to moderate aortic stenosis.  She also has a little bit of insufficiency.  Needs a repeat echocardiogram in 2 to 3 years.  She will need to see me yearly.  4. Dizziness -Symptoms have improved.  5. Essential hypertension 6. HLD -BP a bit elevated today.  No symptoms.  Apparently at home her blood pressure runs in the 120s.  We will keep an eye on it for now. -She is on Crestor 20 mg a day.  Most recent LDL cholesterol 72.  She will let us know what her repeat labs are.  No further titration today.    Disposition:  Return in about 1 year (around 11/08/2020).  Medication Adjustments/Labs and Tests Ordered: Current medicines are reviewed at length with the patient today.  Concerns regarding medicines are outlined  above.  No orders of the defined types were placed in this encounter.  No orders of the defined types were placed in this encounter.   Patient Instructions  Medication Instructions:  Continue same medications *If you need a refill on your cardiac medications before your next appointment, please call your pharmacy*   Lab Work: None ordered   Testing/Procedures: None   Follow-Up: At [facility], you and your health needs are our priority.  As part of our continuing mission to provide you with exceptional heart care, we have created designated Provider Care Teams.  These Care Teams include your primary Cardiologist (physician) and Advanced Practice Providers (APPs -  Physician Assistants and Nurse Practitioners) who all work together to provide you with the care you need, when you need it.  We recommend signing up for the patient portal called "MyChart".  Sign up information is provided on this After Visit Summary.  MyChart is used to connect with patients for Virtual Visits (Telemedicine).  Patients are able to view lab/test results, encounter notes, upcoming appointments, etc.  Non-urgent messages can be sent to your provider as well.   To learn more about what you can do with MyChart, go to [email or web address].    Your next appointment: 1 year   Call in August to schedule Nov appointment     The format for your next appointment: Office   Provider: Dr.O'Neal       Time Spent with Patient: I have spent a total of 25 minutes with patient reviewing hospital notes, telemetry, EKGs, labs and examining the patient as well as establishing an assessment and plan that was discussed with the patient.  > 50% of time was spent in direct patient care.  Signed, Addison Naegeli. Audie Box, [place]  [address] [place], [place] [address] [phone]  11/09/2019 3:29 PM

## 2019-11-09 ENCOUNTER — Other Ambulatory Visit: Payer: Self-pay

## 2019-11-09 ENCOUNTER — Encounter: Payer: Self-pay | Admitting: Cardiovascular Disease

## 2019-11-09 ENCOUNTER — Ambulatory Visit (INDEPENDENT_AMBULATORY_CARE_PROVIDER_SITE_OTHER): Payer: Commercial Managed Care - PPO | Admitting: Cardiovascular Disease

## 2019-11-09 VITALS — BP 158/82 | HR 86 | Ht 67.0 in | Wt 181.0 lb

## 2019-11-09 DIAGNOSIS — I350 Nonrheumatic aortic (valve) stenosis: Secondary | ICD-10-CM | POA: Diagnosis not present

## 2019-11-09 DIAGNOSIS — R002 Palpitations: Secondary | ICD-10-CM | POA: Diagnosis not present

## 2019-11-09 DIAGNOSIS — I10 Essential (primary) hypertension: Secondary | ICD-10-CM

## 2019-11-09 DIAGNOSIS — I471 Supraventricular tachycardia: Secondary | ICD-10-CM | POA: Diagnosis not present

## 2019-11-09 DIAGNOSIS — R42 Dizziness and giddiness: Secondary | ICD-10-CM

## 2019-11-09 DIAGNOSIS — E782 Mixed hyperlipidemia: Secondary | ICD-10-CM

## 2019-11-09 NOTE — Patient Instructions (Signed)
Medication Instructions:  Continue same medications *If you need a refill on your cardiac medications before your next appointment, please call your pharmacy*   Lab Work: None ordered   Testing/Procedures: None   Follow-Up: At [facility], you and your health needs are our priority.  As part of our continuing mission to provide you with exceptional heart care, we have created designated Provider Care Teams.  These Care Teams include your primary Cardiologist (physician) and Advanced Practice Providers (APPs -  Physician Assistants and Nurse Practitioners) who all work together to provide you with the care you need, when you need it.  We recommend signing up for the patient portal called "MyChart".  Sign up information is provided on this After Visit Summary.  MyChart is used to connect with patients for Virtual Visits (Telemedicine).  Patients are able to view lab/test results, encounter notes, upcoming appointments, etc.  Non-urgent messages can be sent to your provider as well.   To learn more about what you can do with MyChart, go to [email or web address].    Your next appointment: 1 year   Call in August to schedule Nov appointment     The format for your next appointment: Office   Provider: Dr.O'Neal

## 2019-11-22 ENCOUNTER — Ambulatory Visit: Payer: Commercial Managed Care - PPO | Admitting: Adult Health

## 2019-11-23 ENCOUNTER — Ambulatory Visit (INDEPENDENT_AMBULATORY_CARE_PROVIDER_SITE_OTHER): Payer: Commercial Managed Care - PPO | Admitting: Adult Health

## 2019-11-23 ENCOUNTER — Ambulatory Visit: Payer: Commercial Managed Care - PPO | Admitting: Neurology

## 2019-11-23 ENCOUNTER — Encounter: Payer: Self-pay | Admitting: Adult Health

## 2019-11-23 ENCOUNTER — Other Ambulatory Visit: Payer: Self-pay

## 2019-11-23 VITALS — BP 137/65 | HR 77 | Ht 67.0 in | Wt 178.0 lb

## 2019-11-23 DIAGNOSIS — Z8673 Personal history of transient ischemic attack (TIA), and cerebral infarction without residual deficits: Secondary | ICD-10-CM

## 2019-11-23 DIAGNOSIS — R42 Dizziness and giddiness: Secondary | ICD-10-CM | POA: Diagnosis not present

## 2019-11-23 NOTE — Progress Notes (Signed)
[facility] [address] [place]. [place] [address] [phone]       OFFICE FOLLOW UP NOTE  [name] Date of Birth:  Apr 03, 1955 Medical Record Number:  [id number]   Reason for visit: Transient dizzy spells    CHIEF COMPLAINT:  Chief Complaint  Patient presents with  . Follow-up     dizzy spells, alone, txt rm, reports less dizzy spell, balance has improved, c/o Acid reflex and vomiting, worse after stroke    HPI:    Today, 11/23/2019, [name] for 71-monthfollow-up regarding transient dizzy spells.  She was evaluated by Dr. 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  He recommended 3 weeks of aspirin Plavix and then Plavix alone as well as initiating topiramate.  She completed 3 weeks of DAPT and remains on Plavix without bleeding or bruising.  She had difficulty tolerating topiramate as well as Depakote previously.  She did have dizziness episode on 8/26 and 9/21 but overall very mild and has had only occasional dizziness sensation since then for very short duration.  She does complain of gradual worsening short-term memory.  She has had no difficulty maintaining ADLs and IADLs independently.  She is currently being worked up for vomiting episodes and globus sensation by Dr. BCristina Gongand [facility]  No further concerns at this time.   History provided for reference purposes only Update 08/18/2019 Dr. SLeonie Man Patient is seen urgently today upon request from her and her husband.  She requested transfer of care from nurse practitioner to me.  She has had recurrent transient episodes of dizziness, imbalance, presyncopal feeling, double and blurred vision with occasional tingling in the left shoulder lasting about 10 to 15 minutes.  She denies specific headaches before during or after the episodes.  Vision does get distorted during this.   She is fully aware of her surroundings.  After the episode is over she denies feeling tired sleepy.  She is presently wearing a Zio patch to see if there is related to possible arrhythmias.  Frequency of the episodes is variable but may occur from twice a week to up to once every 2 3 weeks.  She was recently seen in the ER on 06/11/2019 and had CT angiogram of brain and neck which did not show significant large vessel stenosis or occlusion.  Brain imaging was limited due to bilateral cochlear implants.  MRI could not be done due to cochlear implants.  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  She does have remote history of migraine headaches while young but has outgrown them.  These episodes are quite different from them.  She is unable to identify specific triggers or relieving factors for these episodes.  She is presently wearing a Zio patch which will come off tomorrow.  She has no history of syncope or palpitations.  Patient has no other complaints today.    Update 06/14/2019 JM: Jeanne Sims being seen today for new symptoms onset/concerns.  She presented to ED on 06/11/2019 with symptom onset 1.5 weeks having 3 episodes with sudden onset of dizziness accompanied by headache and double vision lasting approximately 2 to 3 minutes then resolved.  No other focal  deficits except baseline RLE weakness from prior stroke.  CTA head/neck unremarkable.  Unable to obtain MRI due to cochlear implants. She reports onset of lightheadedness/dizziness with presyncopal feeling and then will develop double vision and blurred vision bilaterally and then will have onset of occipital headache. presyncopal episode will last 2-3 minutes; vision symptoms can last 5-15 minutes at times but occipital headache typically lasts longer.  She denies photophobia, phonophobia and n/v. She does prior history of migraines when she was in her 57s but "grew out of them". Denies any other symptoms such as worsening weakness, imbalance, speech impairment,  numbness/tingling or radiating headache pain.  Has not established aggravating factor such as position change or head movements.  Chronic hearing concerns with cochlear implants which have been stable without worsening. Denies actual loss of consciousness, racing heart, shortness or breath or chest pain. She did have recent f/u with pulmonology but no mention about discontinuing Eliquis therefore she has continued at this time. Blood pressure monitored at home and has been stable.  No concerns from a stroke standpoint. Mild right hemiparesis stable without worsening. Continues on eliquis and crestor for secondary stroke prevention. Blood pressure today 122/82. Glucose level stable per patient. No stroke related concerns at this time.   Initial visit 04/05/2019, Jeanne Sims is being seen for hospital follow-up accompanied by her husband.  She does report ongoing recovery with residual mild right hemiparesis and RLE numbness/tingling with increased pain at night.  Continues to work with outpatient therapies.  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  Continues on Eliquis and Crestor for secondary stroke prevention without side effects.  Blood pressure today 133/65.  Glucose levels stable with recent A1c 6.9.  No further concerns at this time.  Stroke admission 02/20/2019: Jeanne Sims is a 64 y.o. female with history of , cervical cancer, pulmonary embolism, COVID-19 dx 3 weeks ago presented on 02/20/2019, with SOB found to have B PEs. Started on [facility] In hospital developed altered sensation in R leg and RUE weakness.  Stroke work-up revealed likely small left subcortical infarct secondary to small vessel disease source patient with acute Covid infection and hypercoagulability  with PE on Eliquis. All other imaging unremarkable.  Did not recommend further work-up at that time as treatment plan would not change.  History of HTN stable.  LDL 72 with continuation of Crestor.  Uncontrolled DM with A1c 7.2 with close PCP follow-up.  No prior history of stroke.  Discharged home in stable condition with recommendation of home health therapies with residual right-sided weakness.  Stroke:  Likely small subcortical infarct secondary to small vessel disease source in pt w/ acute Covid infection hypercoagulability w/ PE on Eliquis  CT head 2/16 no acute abnormality. Mild atrophy.   CTA head mild plaque IC atherosclerosis   CTA neck mild plaque aortic arch and ICAs  CT head 2/18 no acute infarct  2D Echo EF 60-65%. No source of embolus   RLE dopplers neg  LDL 72  HgbA1c 7.2      ROS:   14 system review of systems performed and negative with exception of vomiting  PMH:  Past Medical History:  Diagnosis Date  . Acquired deafness   . Cervical cancer ([place]) 1075  . Cervical cancer ([place])   . CVA (cerebral vascular accident) ([place]) 02/2019  . Diabetes mellitus without complication ([place])   . Dyspareunia   . GERD (gastroesophageal reflux disease)   . Heart murmur   .  Hodgkin disease ([place]) 1987  . Ovarian cancer [facility])    age 25  . Psoriasis   . Thyroid disease     PSH:  Past Surgical History:  Procedure Laterality Date  . ABDOMINAL HYSTERECTOMY     TAH/BSO at age 47 d/t cervical CA  . BASAL CELL CARCINOMA EXCISION     face  . BOWEL RESECTION     followed splenectomy.  had bowel obstruction.  . COCHLEAR IMPLANT  1983   Dr. Thornell Mule  . SPLENECTOMY     a/w Hodgkin's lymphoma    Social History:  Social History   Socioeconomic History  . Marital status: Married    Spouse name: Not on file  . Number of children: 1  . Years of education: Not on file  . Highest education level: Not on file  Occupational History  . Not on file  Tobacco Use  . Smoking  status: Former Smoker    Packs/day: 1.00    Years: 20.00    Pack years: 20.00    Types: Cigarettes    Quit date: 08/2000    Years since quitting: 19.3  . Smokeless tobacco: Never Used  Vaping Use  . Vaping Use: Never used  Substance and Sexual Activity  . Alcohol use: No  . Drug use: No  . Sexual activity: Never    Comment: TAH/BSO  Other Topics Concern  . Not on file  Social History Narrative  . Not on file   Social Determinants of Health   Financial Resource Strain:   . Difficulty of Paying Living Expenses: Not on file  Food Insecurity:   . Worried About Charity fundraiser in the Last Year: Not on file  . Ran Out of Food in the Last Year: Not on file  Transportation Needs:   . Lack of Transportation (Medical): Not on file  . Lack of Transportation (Non-Medical): Not on file  Physical Activity:   . Days of Exercise per Week: Not on file  . Minutes of Exercise per Session: Not on file  Stress:   . Feeling of Stress : Not on file  Social Connections:   . Frequency of Communication with Friends and Family: Not on file  . Frequency of Social Gatherings with Friends and Family: Not on file  . Attends Religious Services: Not on file  . Active Member of Clubs or Organizations: Not on file  . Attends Archivist Meetings: Not on file  . Marital Status: Not on file  Intimate Partner Violence:   . Fear of Current or Ex-Partner: Not on file  . Emotionally Abused: Not on file  . Physically Abused: Not on file  . Sexually Abused: Not on file    Family History:  Family History  Problem Relation Age of Onset  . Diabetes Mother   . Heart disease Mother   . Stroke Mother   . Diabetes Brother   . Heart attack Brother   . Diabetes Sister   . Cervical cancer Sister   . Diabetes Sister   . Cervical cancer Sister   . Diabetes Sister   . Diabetes Brother   . Breast cancer Paternal Uncle   . Breast cancer Maternal Aunt     Medications:   Current Outpatient  Medications on File Prior to Visit  Medication Sig Dispense Refill  . albuterol (VENTOLIN HFA) 108 (90 Base) MCG/ACT inhaler Inhale 2 puffs into the lungs every 6 (six) hours as needed. 18 g 11  . clopidogrel (  PLAVIX) 75 MG tablet Take 1 tablet (75 mg total) by mouth daily. 30 tablet 11  . Cyanocobalamin 1000 MCG/ML KIT Inject as directed every 30 (thirty) days.    [name] dexlansoprazole (DEXILANT) 60 MG capsule Take 60 mg by mouth daily.    . Dulaglutide (TRULICITY) 1.5 WV/3.7TG SOPN Inject 1.5 mg into the skin once a week.     . ferrous sulfate 325 (65 FE) MG tablet Take 1 tablet (325 mg total) by mouth daily with breakfast. Please take with a source of vitamin C (orange juice or tablet with 500 units of Vitamin C). 90 tablet 3  . folic acid (FOLVITE) 626 MCG tablet Take 800 mcg by mouth daily.    [name] gabapentin (NEURONTIN) 100 MG capsule Take 1 capsule (100 mg total) by mouth at bedtime. 90 capsule 3  . levothyroxine (EUTHYROX) 175 MCG tablet Take 175 mcg by mouth daily before breakfast.    . metFORMIN (GLUCOPHAGE) 500 MG tablet Take 500 mg by mouth 2 (two) times daily with a meal.    . rosuvastatin (CRESTOR) 20 MG tablet Take 1 tablet (20 mg total) by mouth daily. 30 tablet 0  . Vitamin D, Ergocalciferol, (DRISDOL) 1.25 MG (50000 UT) CAPS capsule Take 50,000 Units by mouth every 7 (seven) days.     No current facility-administered medications on file prior to visit.    Allergies:   Allergies  Allergen Reactions  . Depakote [Divalproex Sodium]   . Topamax [Topiramate]      Physical Exam  Vitals:   11/23/19 1405  BP: 137/65  Pulse: 77  Weight: 178 lb (80.7 kg)  Height: _0  (1.702 m)   Body mass index is 27.88 kg/m. No exam data present  General: well developed, well nourished, pleasant middle-age Caucasian female, seated, in no evident distress Head: head normocephalic and atraumatic.   Neck: supple with no carotid or supraclavicular bruits Cardiovascular: regular rate and  rhythm, no murmurs Musculoskeletal: no deformity Skin:  no rash/petichiae Vascular:  Normal pulses all extremities   Neurologic Exam Mental Status: Awake and fully alert.   Fluent speech and language.  Oriented to place and time. Recent and remote memory intact. Attention span, concentration and fund of knowledge appropriate. Mood and affect appropriate.  Cranial Nerves: Pupils equal, briskly reactive to light. Extraocular movements full without nystagmus. Visual fields full to confrontation. HOH with cochlear implants.  Mild right lower facial weakness. Motor: Normal bulk and tone.  Full strength left upper and lower extremity and right hemiparesis 4/5 Sensory.:  Decreased vibratory and light touch sensation proximal RLE and distal RUE Coordination: Rapid alternating movements normal in all extremities except mildly decreased right hand. Finger-to-nose and heel-to-shin performed accurately bilaterally. Gait and Station: Arises from chair without difficulty. Stance is normal. Mild favoring of right lower extremity 2/2 weakness with mild imbalance. Mild difficulty with tandem walk. Romberg negative  Reflexes: 1+ and symmetric. Toes downgoing.        ASSESSMENT: Jeanne Sims is a 64 y.o. year old female hospitalized on 02/20/2019 due to shortness of breath in setting of COVID-19 infection and bilateral PEs.  During admission on 02/24/2019, developed altered sensation in right leg and RUE weakness with stroke work-up revealing likely small left subcortical infarct unable to visualize on imaging secondary to small vessel disease with residual right hemiparesis and hemisensory impairment. Vascular risk factors include HTN, HLD, DM, bilateral PEs (provoked).   Being seen today per patient request due to new concerns of  3x episodes  of dizziness/lightheadedness and vertigo and double/blurred vision accompanied by headache with recent evaluation by ED which was largely unremarkable including CT head  and CTA.  Unable to obtain MRI due to cochlear implants.    PLAN:  1. Transient dizziness episodes:  a. Initial complaint 06/14/2019 with ddx new stroke not seen on imaging vs migraine aura vs vs vestibular migraine vs BPPV vs partial complex seizures post stroke.  It was recommended to trial Depakote ER 500 mg nightly with patient unable to tolerate.  Returned to office on 08/18/2019 evaluated by Dr. Leonie Man who reported unclear etiology possibilities include vertebrobasilar TIAs vs atypical migraine.  Recommended DAPT for 3 weeks and Plavix alone as well as trial of topiramate.  Difficulty tolerating topiramate and apparently symptoms improved after starting Plavix and now essentially resolved b. No further evaluation indicated at this time.  We will continue to monitor c. Cardiac monitor and EEG unremarkable 2. Left subcortical stroke:  a. Residual deficit: Right hemiparesis and hemisensory impairment with paresthesias: Continuation of gabapentin 100 mg nightly for ongoing poststroke nerve pain b. Continue clopidogrel 75 mg daily  and Crestor for secondary stroke prevention.   c. Maintain strict control of hypertension with blood pressure goal below 130/90, diabetes with hemoglobin A1c goal below 6.5% and cholesterol with LDL cholesterol (bad cholesterol) goal below 70 mg/dL.  I also advised the patient to eat a healthy diet with plenty of whole grains, cereals, fruits and vegetables, exercise regularly with at least 30 minutes of continuous activity daily and maintain ideal body weight. 3. Vomiting episodes: Continue to follow with Dr. Cristina Gong.  She questions possible stroke related and based on barium swallow study, low suspicion for stroke relation.  She was advised to continue to follow with Dr. Cristina Gong and possibly be referred to a specialist at [facility]    Follow up in 6 months or call earlier if needed   CC:  [place] provider: Dr. Legrand Rams, Rosalyn Charters, MD    I spent 30 minutes of  face-to-face and non-face-to-face time with patient.  This included previsit chart review, lab review, study review, order entry, electronic health record documentation, patient education and discussion regarding transient dizziness episodes, history of prior stroke with residual deficits, importance of managing stroke risk factors, new onset of vomiting episodes and further evaluation and answered all other questions to patient satisfaction   Frann Rider, AGNP-BC  [facility] [address] [place], [place] [address]  Phone [phone] Fax [phone] Note: This document was prepared with digital dictation and possible smart phrase technology. Any transcriptional errors that result from this process are unintentional.

## 2019-11-23 NOTE — Patient Instructions (Signed)
Continue to monitor your memory loss - if this should worsen, please let us know so we can evaluate this further  Please call office if your dizziness episodes worsen or become more frequent   Continue clopidogrel 75 mg daily  and Crestor  for secondary stroke prevention  Continue to follow up with PCP regarding cholesterol and blood pressure management  Maintain strict control of hypertension with blood pressure goal below 130/90, diabetes with hemoglobin A1c goal below 6.5% and cholesterol with LDL cholesterol (bad cholesterol) goal below 70 mg/dL.     Followup in the future with me in 6 months or call earlier if needed     Thank you for coming to see [place] at [facility]. I hope we have been able to provide you high quality care today.  You may receive a patient satisfaction survey over the next few weeks. We would appreciate your feedback and comments so that we may continue to improve ourselves and the health of our patients.

## 2019-11-28 NOTE — Progress Notes (Signed)
I agree with the above plan 

## 2020-03-16 ENCOUNTER — Emergency Department (HOSPITAL_BASED_OUTPATIENT_CLINIC_OR_DEPARTMENT_OTHER): Payer: Commercial Managed Care - PPO

## 2020-03-16 ENCOUNTER — Emergency Department (HOSPITAL_BASED_OUTPATIENT_CLINIC_OR_DEPARTMENT_OTHER)
Admission: EM | Admit: 2020-03-16 | Discharge: 2020-03-16 | Disposition: A | Discharge status: Home / Self Care | Payer: Commercial Managed Care - PPO | Attending: Emergency Medicine | Admitting: Emergency Medicine

## 2020-03-16 ENCOUNTER — Encounter (HOSPITAL_BASED_OUTPATIENT_CLINIC_OR_DEPARTMENT_OTHER): Payer: Self-pay | Admitting: Emergency Medicine

## 2020-03-16 ENCOUNTER — Other Ambulatory Visit: Payer: Self-pay

## 2020-03-16 DIAGNOSIS — R1011 Right upper quadrant pain: Secondary | ICD-10-CM | POA: Insufficient documentation

## 2020-03-16 DIAGNOSIS — Z8541 Personal history of malignant neoplasm of cervix uteri: Secondary | ICD-10-CM | POA: Diagnosis not present

## 2020-03-16 DIAGNOSIS — R42 Dizziness and giddiness: Secondary | ICD-10-CM | POA: Diagnosis not present

## 2020-03-16 DIAGNOSIS — R072 Precordial pain: Secondary | ICD-10-CM | POA: Diagnosis not present

## 2020-03-16 DIAGNOSIS — R5383 Other fatigue: Secondary | ICD-10-CM | POA: Insufficient documentation

## 2020-03-16 DIAGNOSIS — Z7984 Long term (current) use of oral hypoglycemic drugs: Secondary | ICD-10-CM | POA: Diagnosis not present

## 2020-03-16 DIAGNOSIS — Z20822 Contact with and (suspected) exposure to covid-19: Secondary | ICD-10-CM | POA: Insufficient documentation

## 2020-03-16 DIAGNOSIS — Z79899 Other long term (current) drug therapy: Secondary | ICD-10-CM | POA: Diagnosis not present

## 2020-03-16 DIAGNOSIS — Z87891 Personal history of nicotine dependence: Secondary | ICD-10-CM | POA: Insufficient documentation

## 2020-03-16 DIAGNOSIS — Z8543 Personal history of malignant neoplasm of ovary: Secondary | ICD-10-CM | POA: Insufficient documentation

## 2020-03-16 DIAGNOSIS — R0602 Shortness of breath: Secondary | ICD-10-CM | POA: Diagnosis present

## 2020-03-16 DIAGNOSIS — Z8616 Personal history of COVID-19: Secondary | ICD-10-CM | POA: Insufficient documentation

## 2020-03-16 DIAGNOSIS — R079 Chest pain, unspecified: Secondary | ICD-10-CM

## 2020-03-16 DIAGNOSIS — R112 Nausea with vomiting, unspecified: Secondary | ICD-10-CM | POA: Diagnosis not present

## 2020-03-16 DIAGNOSIS — E039 Hypothyroidism, unspecified: Secondary | ICD-10-CM | POA: Insufficient documentation

## 2020-03-16 DIAGNOSIS — E119 Type 2 diabetes mellitus without complications: Secondary | ICD-10-CM | POA: Diagnosis not present

## 2020-03-16 LAB — HEPATIC FUNCTION PANEL
ALT: 15 U/L (ref 0–44)
AST: 22 U/L (ref 15–41)
Albumin: 4.4 g/dL (ref 3.5–5.0)
Alkaline Phosphatase: 73 U/L (ref 38–126)
Bilirubin, Direct: 0.1 mg/dL (ref 0.0–0.2)
Indirect Bilirubin: 0.5 mg/dL (ref 0.3–0.9)
Total Bilirubin: 0.6 mg/dL (ref 0.3–1.2)
Total Protein: 8.2 g/dL — ABNORMAL HIGH (ref 6.5–8.1)

## 2020-03-16 LAB — BASIC METABOLIC PANEL
Anion gap: 14 (ref 5–15)
BUN: 13 mg/dL (ref 8–23)
CO2: 24 mmol/L (ref 22–32)
Calcium: 9.9 mg/dL (ref 8.9–10.3)
Chloride: 99 mmol/L (ref 98–111)
Creatinine, Ser: 0.68 mg/dL (ref 0.44–1.00)
GFR, Estimated: >60 mL/min (ref >60)
Glucose, Bld: 125 mg/dL — ABNORMAL HIGH (ref 70–99)
Potassium: 3.5 mmol/L (ref 3.5–5.1)
Sodium: 137 mmol/L (ref 135–145)

## 2020-03-16 LAB — TROPONIN I (HIGH SENSITIVITY)
Troponin I (High Sensitivity): 6 ng/L (ref <18)
Troponin I (High Sensitivity): 7 ng/L (ref <18)

## 2020-03-16 LAB — CBC
HCT: 43.3 % (ref 36.0–46.0)
Hemoglobin: 14.9 g/dL (ref 12.0–15.0)
MCH: 30.3 pg (ref 26.0–34.0)
MCHC: 34.4 g/dL (ref 30.0–36.0)
MCV: 88 fL (ref 80.0–100.0)
Platelets: 583 10*3/uL — ABNORMAL HIGH (ref 150–400)
RBC: 4.92 MIL/uL (ref 3.87–5.11)
RDW: 12.3 % (ref 11.5–15.5)
WBC: 13.7 10*3/uL — ABNORMAL HIGH (ref 4.0–10.5)
nRBC: 0 % (ref 0.0–0.2)

## 2020-03-16 LAB — LIPASE, BLOOD: Lipase: 25 U/L (ref 11–51)

## 2020-03-16 LAB — BRAIN NATRIURETIC PEPTIDE: B Natriuretic Peptide: 126.5 pg/mL — ABNORMAL HIGH (ref 0.0–100.0)

## 2020-03-16 LAB — D-DIMER, QUANTITATIVE: D-Dimer, Quant: 0.58 ug/mL-FEU — ABNORMAL HIGH (ref 0.00–0.50)

## 2020-03-16 MED ORDER — MORPHINE SULFATE (PF) 4 MG/ML IV SOLN
4.0000 mg | Freq: Once | INTRAVENOUS | Status: AC | PRN
Start: 2020-03-16 — End: 2020-03-16
  Administered 2020-03-16: 4 mg via INTRAVENOUS
  Filled 2020-03-16: qty 1

## 2020-03-16 MED ORDER — ASPIRIN 81 MG PO CHEW
324.0000 mg | CHEWABLE_TABLET | Freq: Once | ORAL | Status: DC
  Filled 2020-03-16: qty 4

## 2020-03-16 MED ORDER — ONDANSETRON HCL 4 MG/2ML IJ SOLN
4.0000 mg | Freq: Once | INTRAMUSCULAR | Status: AC
  Administered 2020-03-16: 4 mg via INTRAVENOUS
  Filled 2020-03-16: qty 2

## 2020-03-16 MED ORDER — IOHEXOL 350 MG/ML SOLN
100.0000 mL | Freq: Once | INTRAVENOUS | Status: AC | PRN
  Administered 2020-03-16: 68 mL via INTRAVENOUS

## 2020-03-16 MED ORDER — SODIUM CHLORIDE 0.9 % IV BOLUS
1000.0000 mL | Freq: Once | INTRAVENOUS | Status: AC
  Administered 2020-03-16: 1000 mL via INTRAVENOUS

## 2020-03-16 NOTE — Discharge Instructions (Addendum)
Please follow-up with your primary care doctor as well as your cardiologist.  You may always return to the ER for any new or concerning symptoms.  It is of the utmost importance that you monitor your symptoms and discuss your symptoms with your cardiologist.  Your valvular disease of your heart may be causing some of this which is why scheduling appointment with cardiology will be your next up.  I would call today to see if they can fit you in early next week.  You may take Tylenol 1000 mg every 6 hours as needed for pain.

## 2020-03-16 NOTE — ED Notes (Signed)
Attempt at PIV unsuccessful,, to be attempted with u/s, bedside chest xr in progress

## 2020-03-16 NOTE — ED Triage Notes (Addendum)
Pt having sob since this am.  Pt seen on Tuesday for sinus issues and ear issues.  No acute respiratory distress noted.  No fever.  No coughing.  Some Nausea and vomiting but this is an ongoing issue for two months.  Pt tested for covid Tuesday and was NEG.

## 2020-03-16 NOTE — ED Provider Notes (Signed)
[facility] EMERGENCY DEPARTMENT Provider Note   CSN: [id number] Arrival date & time: 03/16/20  0920     History Chief Complaint  Patient presents with  . Shortness of Breath  . Emesis  . Chest Pain    Jeanne Sims is a 65 y.o. female.  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 There is a history ofchronic leukocytosis and thrombocytosis with last Heme/Onc visit 11/17/18 that documents negative work up.  Patient was seen 3/8 by her PCP and tightness of upper respiratory tract infection started on amoxicillin which she continues to take.  She states she has had some sinus congestion which is improved and some fatigue however states that this morning she felt short of breath and had chest pain that she describes as sternal and exertional describes as a pressure and heavy sensation. She just describes the chest pain as worse when she was walking around today.  She denies any pleuritic component to her chest pain.  She states that she has had nausea and vomiting however she states that this is constant for her has been ongoing for many months.  She states that her chest pain is moderate presently and has been consistent since soon after she woke up this morning for several hours.  She states that she is no longer on anticoagulation and is on Plavix for secondary stroke prevention.  She endorses some dizziness/LH when she stands up however this seems to have been ongoing problem as well and she was seen by cardiology for this within the past year and her headache for aortic stenosis seems to be stable and we will continue to monitor this for right now.  She denies any  fevers or chills, cough, new back pain.  Denies any abdominal pain.  No other associate symptoms.  No other aggravating factors.  Notably on examination patient's right calf was noted to be larger than her left she states that she does not recall how long was been like this.  She states she has had an ultrasound done when she had a pulmonary embolism but does not recall having an ultrasound since then.     Past Medical History:  Diagnosis Date  . Acquired deafness   . Cervical cancer ([place]) 1075  . Cervical cancer ([place])   . CVA (cerebral vascular accident) ([place]) 02/2019  . Diabetes mellitus without complication ([place])   . Dyspareunia   . GERD (gastroesophageal reflux disease)   . Heart murmur   . Hodgkin disease ([place]) 1987  . Ovarian cancer [facility])    age 59  . Psoriasis   . Thyroid disease     Patient Active Problem List   Diagnosis Date Noted  . C. difficile diarrhea 02/27/2019  . CVA (cerebrovascular accident) ([place]) 02/27/2019  . Cerebrovascular accident (CVA) ([place])   . HLD (hyperlipidemia) 02/21/2019  . Controlled type 2 diabetes mellitus with hyperglycemia ([place]) 02/21/2019  . Hypothyroidism 02/21/2019  . Acute pulmonary embolism ([place]) 02/21/2019  . COVID-19 virus infection 02/21/2019  . PE (physical exam), annual 02/20/2019  . Acute pulmonary embolism without acute cor pulmonale ([place]) 02/20/2019    Past Surgical History:  Procedure Laterality Date  . ABDOMINAL HYSTERECTOMY  TAH/BSO at age 37 d/t cervical CA  . BASAL CELL CARCINOMA EXCISION     face  . BOWEL RESECTION     followed splenectomy.  had bowel obstruction.  . COCHLEAR IMPLANT  1983   Dr. Thornell Mule  . SPLENECTOMY     a/w Hodgkin's lymphoma     OB History   No obstetric history on file.     Family History  Problem Relation Age of Onset  . Diabetes Mother   . Heart disease Mother   . Stroke Mother   . Diabetes Brother   . Heart attack Brother   . Diabetes Sister   . Cervical cancer Sister   .  Diabetes Sister   . Cervical cancer Sister   . Diabetes Sister   . Diabetes Brother   . Breast cancer Paternal Uncle   . Breast cancer Maternal Aunt     Social History   Tobacco Use  . Smoking status: Former Smoker    Packs/day: 1.00    Years: 20.00    Pack years: 20.00    Types: Cigarettes    Quit date: 08/2000    Years since quitting: 19.6  . Smokeless tobacco: Never Used  Vaping Use  . Vaping Use: Never used  Substance Use Topics  . Alcohol use: No  . Drug use: No    Home Medications Prior to Admission medications   Medication Sig Start Date End Date Taking? Authorizing Provider  albuterol (VENTOLIN HFA) 108 (90 Base) MCG/ACT inhaler Inhale 2 puffs into the lungs every 6 (six) hours as needed. 07/05/19  Yes Julian Hy, DO  clopidogrel (PLAVIX) 75 MG tablet Take 1 tablet (75 mg total) by mouth daily. 08/18/19  Yes Garvin Fila, MD  Cyanocobalamin 1000 MCG/ML KIT Inject as directed every 30 (thirty) days.   Yes [provider]  dexlansoprazole (DEXILANT) 60 MG capsule Take 60 mg by mouth daily.   Yes [provider]  Dulaglutide (TRULICITY) 1.5 OM/7.6HM SOPN Inject 1.5 mg into the skin once a week.    Yes [provider]  ferrous sulfate 325 (65 FE) MG tablet Take 1 tablet (325 mg total) by mouth daily with breakfast. Please take with a source of vitamin C (orange juice or tablet with 500 units of Vitamin C). 11/19/18  Yes Orson Slick, MD  folic acid (FOLVITE) 094 MCG tablet Take 800 mcg by mouth daily.   Yes [provider]  levothyroxine (SYNTHROID) 175 MCG tablet Take 175 mcg by mouth daily before breakfast.   Yes [provider]  metFORMIN (GLUCOPHAGE) 500 MG tablet Take 500 mg by mouth 2 (two) times daily with a meal.   Yes [provider]  rosuvastatin (CRESTOR) 20 MG tablet Take 1 tablet (20 mg total) by mouth daily. 02/24/19  Yes Ghimire, Henreitta Leber, MD  Vitamin D, Ergocalciferol, (DRISDOL) 1.25 MG (50000  UT) CAPS capsule Take 50,000 Units by mouth every 7 (seven) days.   Yes [provider]  gabapentin (NEURONTIN) 100 MG capsule Take 1 capsule (100 mg total) by mouth at bedtime. 04/12/19   Frann Rider, NP    Allergies    Azithromycin, Depakote [divalproex sodium], and Topamax [topiramate]  Review of Systems   Review of Systems  Constitutional: Positive for fatigue. Negative for chills and fever.  HENT: Positive for congestion.   Eyes: Negative for pain.  Respiratory: Positive for shortness of breath. Negative for cough.   Cardiovascular: Positive for chest pain. Negative for leg  swelling.  Gastrointestinal: Positive for nausea and vomiting. Negative for abdominal pain and diarrhea.  Genitourinary: Negative for dysuria.  Musculoskeletal: Negative for myalgias.  Skin: Negative for rash.  Neurological: Positive for dizziness. Negative for headaches.    Physical Exam Updated Vital Signs BP (!) 111/52   Pulse 74   Temp (!) 97.5 F (36.4 C) (Oral)   Resp 16   Ht 5' 6"  (1.676 m)   Wt 80.7 kg   SpO2 98%   BMI 28.72 kg/m   Physical Exam Vitals and nursing note reviewed.  Constitutional:      General: She is not in acute distress.    Comments: Pleasant well-appearing 65 year old.  In no acute distress.  Sitting comfortably in bed.  Able answer questions appropriately follow commands. No increased work of breathing. Speaking in full sentences.  HENT:     Head: Normocephalic and atraumatic.     Nose: Nose normal.  Eyes:     General: No scleral icterus. Cardiovascular:     Rate and Rhythm: Normal rate and regular rhythm.     Pulses: Normal pulses.     Heart sounds: Normal heart sounds.  Pulmonary:     Effort: Pulmonary effort is normal. No respiratory distress.     Breath sounds: No wheezing.  Abdominal:     Palpations: Abdomen is soft.     Tenderness: There is abdominal tenderness.  Musculoskeletal:     Cervical back: Normal range of motion.     Right lower leg:  No edema.     Left lower leg: No edema.  Skin:    General: Skin is warm and dry.     Capillary Refill: Capillary refill takes less than 2 seconds.  Neurological:     Mental Status: She is alert. Mental status is at baseline.  Psychiatric:        Mood and Affect: Mood normal.        Behavior: Behavior normal.     ED Results / Procedures / Treatments   Labs (all labs ordered are listed, but only abnormal results are displayed) Labs Reviewed  BASIC METABOLIC PANEL - Abnormal; Notable for the following components:      Result Value   Glucose, Bld 125 (*)    All other components within normal limits  CBC - Abnormal; Notable for the following components:   WBC 13.7 (*)    Platelets 583 (*)    All other components within normal limits  HEPATIC FUNCTION PANEL - Abnormal; Notable for the following components:   Total Protein 8.2 (*)    All other components within normal limits  D-DIMER, QUANTITATIVE - Abnormal; Notable for the following components:   D-Dimer, Quant 0.58 (*)    All other components within normal limits  BRAIN NATRIURETIC PEPTIDE - Abnormal; Notable for the following components:   B Natriuretic Peptide 126.5 (*)    All other components within normal limits  SARS CORONAVIRUS 2 (TAT 6-24 HRS)  LIPASE, BLOOD  TROPONIN I (HIGH SENSITIVITY)  TROPONIN I (HIGH SENSITIVITY)    EKG EKG Interpretation  Date/Time:  Friday March 16 2020 09:37:30 EST Ventricular Rate:  86 PR Interval:    QRS Duration: 83 QT Interval:  374 QTC Calculation: 448 R Axis:   63 Text Interpretation: Sinus rhythm Probable left atrial enlargement Probable left ventricular hypertrophy Minimal ST elevation, inferior leads NSR,  no acute changes from previous Confirmed by Lavenia Atlas [phone]) on 03/16/2020 10:54:48 AM   Radiology CT Angio Chest PE W/Cm &/  Or Wo Cm  Result Date: 03/16/2020 CLINICAL DATA:  Shortness of breath with elevated D-dimer. History of non-Hodgkin's lymphoma EXAM: CT  ANGIOGRAPHY CHEST WITH CONTRAST TECHNIQUE: Multidetector CT imaging of the chest was performed using the standard protocol during bolus administration of intravenous contrast. Multiplanar CT image reconstructions and MIPs were obtained to evaluate the vascular anatomy. CONTRAST:  68 mL OMNIPAQUE IOHEXOL 350 MG/ML SOLN COMPARISON:  Chest radiograph March 16, 2020; chest CT May 26, 2019 FINDINGS: Cardiovascular: There is no demonstrable pulmonary embolus. There is no thoracic aortic aneurysm or dissection. Visualized great vessels appear unremarkable. There are foci of aortic atherosclerosis. There also multiple foci of coronary artery calcification. There is no appreciable pericardial effusion or pericardial thickening. Mediastinum/Nodes: Atrophic thyroid. No evident thoracic adenopathy. Small hiatal hernia. Lungs/Pleura: There is stable scarring in each medial apex. No edema or airspace opacity. No appreciable pleural effusions. No pneumothorax. Trachea and major bronchial structures appear patent. Upper Abdomen: Status post splenectomy. 1 x 1 cm cyst in the anterior segment right lobe of the liver. Visualized upper abdominal structures otherwise appear unremarkable. Musculoskeletal: No blastic or lytic bone lesions. No chest wall lesions evident. Review of the MIP images confirms the above findings. IMPRESSION: 1. No evident pulmonary embolus. No thoracic aortic aneurysm or dissection. There is aortic atherosclerosis as well as foci of coronary artery calcification. 2. Stable scarring in the medial apices. No edema or airspace opacity. No pleural effusions. 3.  No evident adenopathy. 4.  Small hiatal hernia. 5.  Status post splenectomy. Aortic Atherosclerosis (ICD10-I70.0). Electronically Signed   By: Lowella Grip III M.D.   On: 03/16/2020 13:02   US Venous Img Lower Right (DVT Study)  Result Date: 03/16/2020 CLINICAL DATA:  Asymmetric leg. EXAM: RIGHT LOWER EXTREMITY VENOUS DOPPLER ULTRASOUND TECHNIQUE:  Gray-scale sonography with compression, as well as color and duplex ultrasound, were performed to evaluate the deep venous system(s) from the level of the common femoral vein through the popliteal and proximal calf veins. COMPARISON:  None. FINDINGS: VENOUS Normal compressibility of the common femoral, superficial femoral, and popliteal veins, as well as the visualized calf veins. Visualized portions of profunda femoral vein and great saphenous vein unremarkable. No filling defects to suggest DVT on grayscale or color Doppler imaging. Doppler waveforms show normal direction of venous flow, normal respiratory plasticity and response to augmentation. Limited views of the contralateral common femoral vein are unremarkable. IMPRESSION: No evidence of DVT. Electronically Signed   By: Margaretha Sheffield MD   On: 03/16/2020 11:56   DG Chest Portable 1 View  Result Date: 03/16/2020 CLINICAL DATA:  Shortness of breath and chest pain this morning. Nausea and vomiting for 2 months. EXAM: PORTABLE CHEST 1 VIEW COMPARISON:  PA and lateral chest 03/23/2019. FINDINGS: The lungs are clear. Heart size is normal. No pneumothorax or pleural effusion. A tube projecting over the chest appears to be external to the patient. IMPRESSION: No acute disease. A tube seen on the image appears to be external to the patient. Electronically Signed   By: Inge Rise M.D.   On: 03/16/2020 10:21   US Abdomen Limited RUQ (LIVER/GB)  Result Date: 03/16/2020 CLINICAL DATA:  Acute right upper quadrant pain EXAM: ULTRASOUND ABDOMEN LIMITED RIGHT UPPER QUADRANT COMPARISON:  CT abdomen pelvis November 10, 2018. FINDINGS: Gallbladder: No gallstones or wall thickening visualized. No sonographic Murphy sign noted by sonographer. Common bile duct: Diameter: 4 mm Liver: No solid focal lesion identified. Diffusely increased parenchymal echogenicity with coarsened hepatic echotexture. 1.5 cm  cyst in the right lobe of the liver. Portal vein is patent on  color Doppler imaging with normal direction of blood flow towards the liver. Other: None. IMPRESSION: 1. Unremarkable sonographic appearance of the gallbladder. 2. The echogenicity of the liver is increased. This is a nonspecific finding but is most commonly seen with fatty infiltration of the liver. There are no obvious focal liver lesions. Electronically Signed   By: Dahlia Bailiff MD   On: 03/16/2020 12:18    Procedures Procedures   Medications Ordered in ED Medications  ondansetron [facility]) injection 4 mg (4 mg Intravenous Given 03/16/20 1003)  sodium chloride 0.9 % bolus 1,000 mL (0 mLs Intravenous Stopped 03/16/20 1337)  morphine 4 MG/ML injection 4 mg (4 mg Intravenous Given 03/16/20 1207)  iohexol (OMNIPAQUE) 350 MG/ML injection 100 mL (68 mLs Intravenous Contrast Given 03/16/20 1219)    ED Course  I have reviewed the triage vital signs and the nursing notes.  Pertinent labs & imaging results that were available during my care of the patient were reviewed by me and considered in my medical decision making (see chart for details).  Patient is a 65 year old female with past medical history of aortic stenosis, stroke, PE presented today with chest pain that is moderate, achy, seems to wax and wane.  She states that her chest pain began this morning.  She has chronic issues with nausea and vomiting which are unchanged today.  No other significant associated symptoms.  She does on physical exam has a larger right lower extremity calf than her left.  She recalls that she has had this before however does not recall if she has had any ultrasounds since she first noticed this.  She is not on any anticoagulation.  She states "this pain feels like when he had a PE "  Patient is not tachycardic or hypoxic.  I have relatively low suspicion for pulmonary embolism however given her history will obtain dimer given that it was significantly elevated during her last PE greater than 1. Given that her dimer  is elevated at 0.58 today will proceed with CT PE study chest. Notably patient did have some right upper quadrant abdominal pain and given the very mild leukocytosis will obtain right upper quadrant ultrasound however normal LFTs and lipase.  Doubt pancreatitis, choledocholithiasis, cholecystitis.  Clinical Course as of 03/16/20 1347  Fri Mar 16, 2020  1055 WBC(!): 13.7 As noted in HPI patient has chronic leukocytosis.  This is been worked up by oncology in the past.  No significant deviation from her baseline. [WF]  1055 BMP without significant abnormality. [WF]  1056 Troponin x1 is 6 [WF]  1202 RLE [place] negative for DVT [WF]  1243 Right upper quadrant ultrasound is unremarkable.  IMPRESSION: 1. Unremarkable sonographic appearance of the gallbladder. 2. The echogenicity of the liver is increased. This is a nonspecific finding but is most commonly seen with fatty infiltration of the liver. There are no obvious focal liver lesions.   [WF]  1343 Troponin x2 within normal limits. [WF]  9528 Covid test pending at this time. [WF]    Clinical Course User Index [WF] Tedd Sias, PA  BNP not elevated.  I have low suspicion for heart failure as a contribution to her symptoms. MDM Rules/Calculators/A&P                          Patient presented today with chest pain/right upper quadrant abdominal pain.  Broad work-up  obtained given patient's age, medical issues, presentation.  The medical records were personally reviewed by myself. I personally reviewed all lab results and interpreted all imaging studies and either concurred with their official read or contacted radiology for clarification. Additional history obtained from old records family members  This patient appears reasonably screened and I doubt any other medical condition requiring further workup, evaluation, or treatment in the ED at this time prior to discharge.   Patient's vitals are WNL apart from vital sign abnormalities  discussed above, patient is in NAD, and able to ambulate in the ED at their baseline and able to tolerate PO.  Pain has been managed or a plan has been made for home management and has no complaints prior to discharge. Patient is comfortable with above plan and for discharge at this time. All questions were answered prior to disposition. Results from the ER workup discussed with the patient face to face and all questions answered to the best of my ability. The patient is safe for discharge with strict return precautions. Patient appears safe for discharge with appropriate follow-up. Conveyed my impression with the patient and they voiced understanding and are agreeable to plan.   An After Visit Summary was printed and given to the patient.  Portions of this note were generated with [employer]. Dictation errors may occur despite best attempts at proofreading.   I do have some suspicion for valvular disease being the cause of her pain.  She will follow up with cardiology.  She is already established with 1.  She will also follow-up with her primary care provider.  Final Clinical Impression(s) / ED Diagnoses Final diagnoses:  RUQ pain  Chest pain, unspecified type  Shortness of breath    Rx / DC Orders ED Discharge Orders    None       Tedd Sias, [place] 03/16/20 [place], [place], DO 03/18/20 [phone]

## 2020-03-16 NOTE — ED Notes (Signed)
Remains in CT

## 2020-03-17 LAB — SARS CORONAVIRUS 2 (TAT 6-24 HRS): SARS Coronavirus 2: NEGATIVE

## 2020-03-19 ENCOUNTER — Telehealth: Payer: Self-pay | Admitting: Cardiovascular Disease

## 2020-03-19 NOTE — Telephone Encounter (Signed)
That is fine with me.   [place] T. Audie Box, MD, [place]  [address], [place],  [address] [phone]  2:12 PM

## 2020-03-19 NOTE — Telephone Encounter (Signed)
Patient is requesting to switch providers from Dr. Audie Box to Dr. Harrell Gave. Please advise.

## 2020-03-19 NOTE — Telephone Encounter (Signed)
OK by me 

## 2020-03-20 ENCOUNTER — Ambulatory Visit: Payer: Commercial Managed Care - PPO | Admitting: Cardiology

## 2020-03-23 ENCOUNTER — Encounter: Payer: Self-pay | Admitting: Cardiology

## 2020-03-23 ENCOUNTER — Other Ambulatory Visit: Payer: Self-pay

## 2020-03-23 ENCOUNTER — Ambulatory Visit: Payer: Commercial Managed Care - PPO | Admitting: Cardiology

## 2020-03-23 VITALS — BP 130/64 | HR 85 | Ht 66.0 in | Wt 158.0 lb

## 2020-03-23 DIAGNOSIS — Z7189 Other specified counseling: Secondary | ICD-10-CM

## 2020-03-23 DIAGNOSIS — R0789 Other chest pain: Secondary | ICD-10-CM

## 2020-03-23 DIAGNOSIS — R0602 Shortness of breath: Secondary | ICD-10-CM | POA: Diagnosis not present

## 2020-03-23 DIAGNOSIS — R002 Palpitations: Secondary | ICD-10-CM | POA: Diagnosis not present

## 2020-03-23 NOTE — Progress Notes (Signed)
Cardiology Office Note:    Date:  03/23/2020   ID:  Jeanne Sims, DOB 05-23-1955, MRN [id number]  PCP:  Jeanne Nasuti, MD  Cardiologist:  Jeanne Dresser, MD  Referring MD: Jeanne Nasuti, MD   CC: new patient to me/ER follow up  History of Present Illness:    Jeanne Sims is a 65 y.o. female with a hx of pulmonary embolism, CVA, type II diabetes, hyperlipidemia, mild aortic stenosis, paroxysmal SVT, deafness s/p cochlear implant who is seen as a new patient to me today, previously seen by Jeanne Sims.  Se was last seen by Jeanne Sims on 11/09/19. Since that visit she has been seen in the ER 03/16/20 for shortness of breath, chest pain. hsTnI unremarkable, d dimer elevated but CT PE negative, LE U/S negative for DVT. Noted to have aortic atherosclerosis and coronary artery calcification on CT. The ER physician was concerned that her aortic stenosis may be the cause of her pain, sent back to cardiology. Of note, on echo 02/21/19, mild AS/mild AR.  I see her husband, Jeanne Sims.  Today: We reviewed episode that brought her to the ER. She had shortness of breath and a pinching feeling in her chest. She did not notice a rapid heart beat at the time. Woke up that morning short of breath, had trouble trying to eat breakfast, take a shower. Very easily fatigued and short of breath. She did go to work but continued to feel poorly, called her husband and went to ER. Feeling lasted several hours, pain went away after receiving morphine. Didn't have emesis the entire next day.  She had been off metoprolol but restarted after going to the ER. It was a different set of symptoms than what she had during prior SVT events.   No syncope, had presyncope during her spell 3/11.   Being evaluated for dysphagia, pending workup at [facility]. Has been having emesis and difficulty eating, has lost significant weight unintentionally.  Gets out of breath while walking.   Has a history of  diabetes.  Grandfather had history of heart disease/mild heart attack.  Tried kardia mobile today. We reviewed her prior monitor and echo results.  Past Medical History:  Diagnosis Date   Acquired deafness    Cervical cancer ([place]) 1075   Cervical cancer (HCC)    CVA (cerebral vascular accident) ([place]) 02/2019   Diabetes mellitus without complication (HCC)    Dyspareunia    GERD (gastroesophageal reflux disease)    Heart murmur    Hodgkin disease ([place]) 1987   Ovarian cancer ([place])    age 17   Psoriasis    Thyroid disease     Past Surgical History:  Procedure Laterality Date   ABDOMINAL HYSTERECTOMY     TAH/BSO at age 35 d/t cervical CA   BASAL CELL CARCINOMA EXCISION     face   BOWEL RESECTION     followed splenectomy.  had bowel obstruction.   COCHLEAR IMPLANT  1983   Dr. Thornell Sims   SPLENECTOMY     a/w Hodgkin's lymphoma    Current Medications: Current Outpatient Medications on File Prior to Visit  Medication Sig   albuterol (VENTOLIN HFA) 108 (90 Base) MCG/ACT inhaler Inhale 2 puffs into the lungs every 6 (six) hours as needed.   clopidogrel (PLAVIX) 75 MG tablet Take 1 tablet (75 mg total) by mouth daily.   Cyanocobalamin 1000 MCG/ML KIT Inject as directed every 30 (thirty) days.   dexlansoprazole (DEXILANT) 60 MG capsule  Take 60 mg by mouth daily.   Dulaglutide (TRULICITY) 1.5 TD/4.2AJ SOPN Inject 1.5 mg into the skin once a week.    ferrous sulfate 325 (65 FE) MG tablet Take 1 tablet (325 mg total) by mouth daily with breakfast. Please take with a source of vitamin C (orange juice or tablet with 500 units of Vitamin C).   folic acid (FOLVITE) 681 MCG tablet Take 800 mcg by mouth daily.   gabapentin (NEURONTIN) 100 MG capsule Take 1 capsule (100 mg total) by mouth at bedtime.   levothyroxine (SYNTHROID) 175 MCG tablet Take 175 mcg by mouth daily before breakfast.   metFORMIN (GLUCOPHAGE) 500 MG tablet Take 500 mg by mouth 2 (two) times daily with a meal.   rosuvastatin  (CRESTOR) 20 MG tablet Take 1 tablet (20 mg total) by mouth daily.   Vitamin D, Ergocalciferol, (DRISDOL) 1.25 MG (50000 UT) CAPS capsule Take 50,000 Units by mouth every 7 (seven) days.   No current facility-administered medications on file prior to visit.     Allergies:   Azithromycin, Depakote [divalproex sodium], and Topamax [topiramate]   Social History   Tobacco Use   Smoking status: Former Smoker    Packs/day: 1.00    Years: 20.00    Pack years: 20.00    Types: Cigarettes    Quit date: 08/2000    Years since quitting: 19.6   Smokeless tobacco: Never Used  Vaping Use   Vaping Use: Never used  Substance Use Topics   Alcohol use: No   Drug use: No    Family History: family history includes Breast cancer in her maternal aunt and paternal uncle; Cervical cancer in her sister and sister; Diabetes in her brother, brother, mother, sister, sister, and sister; Heart attack in her brother; Heart disease in her mother; Stroke in her mother.  ROS:   Please see the history of present illness.  Additional pertinent ROS otherwise unremarkable.     EKGs/Labs/Other Studies Reviewed:    The following studies were reviewed today: Echo 02/21/19  1. Left ventricular ejection fraction, by estimation, is 60 to 65%. The  left ventricle has normal function. The left ventricle has no regional  wall motion abnormalities. Left ventricular diastolic function could not  be evaluated.   2. Right ventricular systolic function is normal. The right ventricular  size is normal.   3. The mitral valve is degenerative. Trivial mitral valve regurgitation.   4. The aortic valve is tricuspid. Aortic valve regurgitation is mild.  Mild aortic valve stenosis.   Monitor 09/01/19 Enrollment period 08/09/2019-08/19/2019 (9 days 12 hours). Patient had a min HR of 22 bpm (sinus bradycardia with type 2 AV block Wenckebach), max HR of 148 bpm (supraventricular tachycardia, 20 beats, 8.9 seconds), and avg HR of 88 bpm.  Predominant underlying rhythm was Sinus Rhythm. 2 Supraventricular Tachycardia runs occurred, the run with the fastest interval lasting 20 beats (8.9 seconds) with a max rate of 148 bpm (avg 134 bpm); the run with the fastest interval was also the longest. There were 8 episode(s) of AV Block (2nd type 1, Wenckebach) occurred, lasting a total of 27 secs. No symptoms reported with AV block. Isolated SVEs were rare (<1.0%, 211), SVE Couplets were rare (<1.0%, 8), and SVE Triplets were rare (<1.0%, 2). Isolated VEs were rare (<1.0%), and no VE Couplets or VE Triplets were present. Diary summarized below:   08/12/19 12:00am chest pain/pressure, pain/tingling (in neck or arm) coincided with normal sinus rhythm 84 bpm. 08/12/19 11:00am  short of breath, lightheaded coincided with normal sinus rhythm 92 bpm. 08/16/19 10:10am skipped/irregular beat(s), short of breath, lightheaded, dizziness, chest pain/pressure, pain/tingling (in neck or arm), anxious coincided with sinus tachycardia 100 bpm.   Impression:   1. 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 2. Brief ectopic atrial tachycardia episodes without symptoms. 2 episodes in 9 days with longest episode 8.9 seconds. 3. Symptoms occurred with normal sinus rhythm. 4. Rare ectopy.  EKG:  EKG is personally reviewed.  The ekg ordered today demonstrates NSR at 85 bpm  Recent Labs: 03/16/2020: ALT 15; B Natriuretic Peptide 126.5; BUN 13; Creatinine, Ser 0.68; Hemoglobin 14.9; Platelets 583; Potassium 3.5; Sodium 137  Recent Lipid Panel    Component Value Date/Time   CHOL 134 02/24/2019 0500   TRIG 135 02/24/2019 0500   HDL 35 (L) 02/24/2019 0500   CHOLHDL 3.8 02/24/2019 0500   VLDL 27 02/24/2019 0500   LDLCALC 72 02/24/2019 0500    Physical Exam:    VS:  BP 130/64   Pulse 85   Ht 5' 6"  (1.676 m)   Wt 158 lb (71.7 kg)   SpO2 97%   BMI 25.50 kg/m     Wt Readings from Last 3 Encounters:  03/23/20 158 lb (71.7 kg)   03/16/20 177 lb 14.6 oz (80.7 kg)  11/23/19 178 lb (80.7 kg)    GEN: Well nourished, well developed in no acute distress HEENT: Normal, moist mucous membranes NECK: No JVD CARDIAC: regular rhythm, normal S1 and S2, no rubs or gallops. No murmur. VASCULAR: Radial and DP pulses 2+ bilaterally. No carotid bruits RESPIRATORY:  Clear to auscultation without rales, wheezing or rhonchi  ABDOMEN: Soft, non-tender, non-distended MUSCULOSKELETAL:  Ambulates independently SKIN: Warm and dry, no edema NEUROLOGIC:  Alert and oriented x 3. No focal neuro deficits noted. PSYCHIATRIC:  Normal affect    ASSESSMENT:    1. Palpitations   2. Atypical chest pain   3. Shortness of breath   4. Cardiac risk counseling   5. Counseling on health promotion and disease prevention    PLAN:    Chest pain, atypical Shortness of breath Palpitations -we reviewed her prior evaluation, especially noted that symptomatic events on monitor did not correlate with arrhythmia -reviewed how to use [place] today, she will consider -reviewed red flag warning signs that need immediate medical attention  Fatigue, weight loss, emesis, difficulty eating -very concerning, being evaluated by [facility]  Cardiac risk counseling and prevention recommendations: -recommend heart healthy/Mediterranean diet, with whole grains, fruits, vegetable, fish, lean meats, nuts, and olive oil. Limit salt. -recommend moderate walking, 3-5 times/week for 30-50 minutes each session. Aim for at least 150 minutes.week. Goal should be pace of 3 miles/hours, or walking 1.5 miles in 30 minutes -recommend avoidance of tobacco products. Avoid excess alcohol. -ASCVD risk score: The ASCVD Risk score Mikey Bussing DC Jr., et al., 2013) failed to calculate for the following reasons:   The patient has a prior MI or stroke diagnosis    Plan for follow up: 2 mos or sooner as needed  Jeanne Dresser, MD, PhD, [facility]     Medication Adjustments/Labs and Tests Ordered: Current medicines are reviewed at length with the patient today.  Concerns regarding medicines are outlined above.  Orders Placed This Encounter  Procedures   EKG 12-Lead   No orders of the defined types were placed in this encounter.   Patient Instructions  Medication Instructions:  Your Physician recommend  you continue on your current medication as directed.    *If you need a refill on your cardiac medications before your next appointment, please call your pharmacy*   Lab Work: None   Testing/Procedures: None   Follow-Up: At [facility], you and your health needs are our priority.  As part of our continuing mission to provide you with exceptional heart care, we have created designated Provider Care Teams.  These Care Teams include your primary Cardiologist (physician) and Advanced Practice Providers (APPs -  Physician Assistants and Nurse Practitioners) who all work together to provide you with the care you need, when you need it.  We recommend signing up for the patient portal called "MyChart".  Sign up information is provided on this After Visit Summary.  MyChart is used to connect with patients for Virtual Visits (Telemedicine).  Patients are able to view lab/test results, encounter notes, upcoming appointments, etc.  Non-urgent messages can be sent to your provider as well.   To learn more about what you can do with MyChart, go to [email or web address].    Your next appointment:   2 month(s)  The format for your next appointment:   In Person  Provider:   Buford Dresser, MD    Signed, Jeanne Dresser, MD PhD 03/23/2020     [place]

## 2020-03-23 NOTE — Patient Instructions (Signed)
Medication Instructions:  °Your Physician recommend you continue on your current medication as directed.   ° °*If you need a refill on your cardiac medications before your next appointment, please call your pharmacy* ° ° °Lab Work: °None ° ° °Testing/Procedures: °None ° ° °Follow-Up: °At [facility], you and your health needs are our priority.  As part of our continuing mission to provide you with exceptional heart care, we have created designated Provider Care Teams.  These Care Teams include your primary Cardiologist (physician) and Advanced Practice Providers (APPs -  Physician Assistants and Nurse Practitioners) who all work together to provide you with the care you need, when you need it. ° °We recommend signing up for the patient portal called "MyChart".  Sign up information is provided on this After Visit Summary.  MyChart is used to connect with patients for Virtual Visits (Telemedicine).  Patients are able to view lab/test results, encounter notes, upcoming appointments, etc.  Non-urgent messages can be sent to your provider as well.   °To learn more about what you can do with MyChart, go to [email or web address].   ° °Your next appointment:   °2 month(s) ° °The format for your next appointment:   °In Person ° °Provider:   °Bridgette Christopher, MD ° ° ° ° °

## 2020-05-07 ENCOUNTER — Telehealth: Payer: Self-pay | Admitting: Adult Health

## 2020-05-07 NOTE — Telephone Encounter (Signed)
Pt's daughter, Modesto Charon called, [facility] have faxed a form for surgical clearance release of  clopidogrel (PLAVIX) 75 MG tablet. Would like a call from the nurse.

## 2020-05-08 ENCOUNTER — Telehealth: Payer: Self-pay | Admitting: Emergency Medicine

## 2020-05-08 ENCOUNTER — Encounter: Payer: Self-pay | Admitting: Adult Health

## 2020-05-08 NOTE — Telephone Encounter (Signed)
Completed and placed in out box.  Thank you.

## 2020-05-08 NOTE — Telephone Encounter (Signed)
Received surgical clearance forms from [facility].  Papers on Dr. Clydene Fake desk.

## 2020-05-08 NOTE — Telephone Encounter (Signed)
er

## 2020-05-09 NOTE — Telephone Encounter (Signed)
Fax confirmation received [phone]. [facility].

## 2020-05-11 ENCOUNTER — Ambulatory Visit: Payer: Commercial Managed Care - PPO | Admitting: Cardiology

## 2020-05-22 ENCOUNTER — Ambulatory Visit: Payer: Commercial Managed Care - PPO | Admitting: Adult Health

## 2020-05-23 ENCOUNTER — Ambulatory Visit: Payer: Commercial Managed Care - PPO | Admitting: Adult Health

## 2020-06-17 DIAGNOSIS — D531 Other megaloblastic anemias, not elsewhere classified: Secondary | ICD-10-CM | POA: Insufficient documentation

## 2020-06-17 DIAGNOSIS — I10 Essential (primary) hypertension: Secondary | ICD-10-CM | POA: Diagnosis present

## 2020-07-16 ENCOUNTER — Encounter: Payer: Self-pay | Admitting: Cardiology

## 2020-07-28 ENCOUNTER — Other Ambulatory Visit: Payer: Self-pay

## 2020-07-28 ENCOUNTER — Emergency Department (HOSPITAL_BASED_OUTPATIENT_CLINIC_OR_DEPARTMENT_OTHER)
Admission: EM | Admit: 2020-07-28 | Discharge: 2020-07-28 | Disposition: A | Discharge status: Other Acute Inpatient Hospital | Payer: Medicare HMO | Attending: Emergency Medicine | Admitting: Emergency Medicine

## 2020-07-28 ENCOUNTER — Encounter (HOSPITAL_BASED_OUTPATIENT_CLINIC_OR_DEPARTMENT_OTHER): Payer: Self-pay | Admitting: Emergency Medicine

## 2020-07-28 DIAGNOSIS — Z20822 Contact with and (suspected) exposure to covid-19: Secondary | ICD-10-CM | POA: Insufficient documentation

## 2020-07-28 DIAGNOSIS — T85528A Displacement of other gastrointestinal prosthetic devices, implants and grafts, initial encounter: Secondary | ICD-10-CM

## 2020-07-28 DIAGNOSIS — Z8541 Personal history of malignant neoplasm of cervix uteri: Secondary | ICD-10-CM | POA: Insufficient documentation

## 2020-07-28 DIAGNOSIS — Y828 Other medical devices associated with adverse incidents: Secondary | ICD-10-CM | POA: Insufficient documentation

## 2020-07-28 DIAGNOSIS — Z8543 Personal history of malignant neoplasm of ovary: Secondary | ICD-10-CM | POA: Diagnosis not present

## 2020-07-28 DIAGNOSIS — E119 Type 2 diabetes mellitus without complications: Secondary | ICD-10-CM | POA: Diagnosis not present

## 2020-07-28 DIAGNOSIS — Z7982 Long term (current) use of aspirin: Secondary | ICD-10-CM | POA: Diagnosis not present

## 2020-07-28 LAB — RESP PANEL BY RT-PCR (FLU A&B, COVID) ARPGX2
Influenza A by PCR: NEGATIVE
Influenza B by PCR: NEGATIVE
SARS Coronavirus 2 by RT PCR: NEGATIVE

## 2020-07-28 NOTE — ED Provider Notes (Signed)
[facility] Emergency Department Provider Note MRN:  [id number]  Arrival date & time: 07/28/20     Chief Complaint   Other (J tube displaced)   History of Present Illness   Jeanne Sims is a 65 y.o. year-old female with a history of stage IV gastric cancer presenting to the ED with chief complaint of J-tube displaced.  Patient's J-tube fell out this evening.  Denies any pain, no other complaints.  Not sure how it fell out.  The balloon did not seem to be inflated.  J-tube was placed by surgical oncologist at [facility].  Review of Systems  A complete 10 system review of systems was obtained and all systems are negative except as noted in the HPI and PMH.   Patient's Health History    Past Medical History:  Diagnosis Date   Acquired deafness    Cervical cancer ([place]) 1075   Cervical cancer (HCC)    CVA (cerebral vascular accident) ([place]) 02/2019   Diabetes mellitus without complication (HCC)    Dyspareunia    GERD (gastroesophageal reflux disease)    Heart murmur    Hodgkin disease ([place]) 1987   Ovarian cancer ([place])    age 56   Psoriasis    Thyroid disease     Past Surgical History:  Procedure Laterality Date   ABDOMINAL HYSTERECTOMY     TAH/BSO at age 60 d/t cervical CA   BASAL CELL CARCINOMA EXCISION     face   BOWEL RESECTION     followed splenectomy.  had bowel obstruction.   COCHLEAR IMPLANT  1983   Dr. Thornell Mule   SPLENECTOMY     a/w Hodgkin's lymphoma    Family History  Problem Relation Age of Onset   Diabetes Mother    Heart disease Mother    Stroke Mother    Diabetes Brother    Heart attack Brother    Diabetes Sister    Cervical cancer Sister    Diabetes Sister    Cervical cancer Sister    Diabetes Sister    Diabetes Brother    Breast cancer Paternal Uncle    Breast cancer Maternal Aunt     Social History   Socioeconomic History   Marital status: Married    Spouse name: Not on file   Number of children: 1   Years of  education: Not on file   Highest education level: Not on file  Occupational History   Not on file  Tobacco Use   Smoking status: Former    Packs/day: 1.00    Years: 20.00    Pack years: 20.00    Types: Cigarettes    Quit date: 08/2000    Years since quitting: 19.9   Smokeless tobacco: Never  Vaping Use   Vaping Use: Never used  Substance and Sexual Activity   Alcohol use: No   Drug use: No   Sexual activity: Never    Comment: TAH/BSO  Other Topics Concern   Not on file  Social History Narrative   Not on file   Social Determinants of Health   Financial Resource Strain: Not on file  Food Insecurity: Not on file  Transportation Needs: Not on file  Physical Activity: Not on file  Stress: Not on file  Social Connections: Not on file  Intimate Partner Violence: Not on file     Physical Exam   Vitals:   07/28/20 0145  BP: (!) 160/68  Pulse: 85  Resp: 16  Temp: [name])  97.4 F (36.3 C)  SpO2: 96%    CONSTITUTIONAL: Well-appearing, NAD NEURO:  Alert and oriented x 3, no focal deficits EYES:  eyes equal and reactive ENT/NECK:  no LAD, no JVD CARDIO: Regular rate, well-perfused, normal S1 and S2 PULM:  CTAB no wheezing or rhonchi GI/GU:  normal bowel sounds, non-distended, non-tender; J-tube stoma without erythema, no significant leaking MSK/SPINE:  No gross deformities, no edema SKIN:  no rash, atraumatic PSYCH:  Appropriate speech and behavior  *Additional and/or pertinent findings included in MDM below  Diagnostic and Interventional Summary    EKG Interpretation  Date/Time:    Ventricular Rate:    PR Interval:    QRS Duration:   QT Interval:    QTC Calculation:   R Axis:     Text Interpretation:         Labs Reviewed  RESP PANEL BY RT-PCR (FLU A&B, COVID) [id number]    No orders to display    Medications - No data to display   Procedures  /  Critical Care Procedures  ED Course and Medical Decision Making  I have reviewed the triage vital signs,  the nursing notes, and pertinent available records from the EMR.  Listed above are laboratory and imaging tests that I personally ordered, reviewed, and interpreted and then considered in my medical decision making (see below for details).  Dislodged J-tube, will call surgical oncology at [facility] to see if patient needs transfer this evening.  Per notes she is 100% dependent on parenteral intake.     Discussed case with Dr. Kern Reap of [facility] surgical oncology, who accepts patient for transfer and admission.  Per request, I attempted to place Foley catheter into the J-tube stoma site.  Consistently met resistance and discomfort after only 2 or 3 cm of Foley tube within the stoma.  I felt it best to leave the Foley out with no further manipulation.  Barth Kirks. [place] Small, [place] [email or web address]  Final Clinical Impressions(s) / ED Diagnoses     ICD-10-CM   1. Jejunostomy tube fell out  T85.528A       ED Discharge Orders     None        Discharge Instructions Discussed with and Provided to Patient:     Discharge Instructions      We are transferring you to [facility] for further care.  You have elected to travel via private vehicle.  Please drive straight to [facility] for admission and further care.        Maudie Flakes, MD 07/28/20 [phone]

## 2020-07-28 NOTE — Discharge Instructions (Addendum)
We are transferring you to [facility] for further care.  You have elected to travel via private vehicle.  Please drive straight to [facility] for admission and further care.

## 2020-07-28 NOTE — ED Triage Notes (Signed)
Pt's J tube came out 30 min PTA

## 2021-08-31 DIAGNOSIS — R180 Malignant ascites: Secondary | ICD-10-CM | POA: Insufficient documentation

## 2021-09-12 ENCOUNTER — Emergency Department (HOSPITAL_COMMUNITY): Payer: Medicare HMO

## 2021-09-12 ENCOUNTER — Encounter (HOSPITAL_COMMUNITY): Payer: Self-pay | Admitting: Emergency Medicine

## 2021-09-12 ENCOUNTER — Other Ambulatory Visit: Payer: Self-pay

## 2021-09-12 ENCOUNTER — Inpatient Hospital Stay (HOSPITAL_COMMUNITY)
Admission: EM | Admit: 2021-09-12 | Discharge: 2021-10-06 | DRG: 951 | Disposition: E | Payer: Medicare HMO | Attending: Internal Medicine | Admitting: Internal Medicine

## 2021-09-12 DIAGNOSIS — E119 Type 2 diabetes mellitus without complications: Secondary | ICD-10-CM | POA: Diagnosis not present

## 2021-09-12 DIAGNOSIS — R7989 Other specified abnormal findings of blood chemistry: Secondary | ICD-10-CM

## 2021-09-12 DIAGNOSIS — G9341 Metabolic encephalopathy: Secondary | ICD-10-CM | POA: Diagnosis present

## 2021-09-12 DIAGNOSIS — R52 Pain, unspecified: Secondary | ICD-10-CM

## 2021-09-12 DIAGNOSIS — Z823 Family history of stroke: Secondary | ICD-10-CM

## 2021-09-12 DIAGNOSIS — Z789 Other specified health status: Secondary | ICD-10-CM

## 2021-09-12 DIAGNOSIS — R0989 Other specified symptoms and signs involving the circulatory and respiratory systems: Secondary | ICD-10-CM | POA: Diagnosis present

## 2021-09-12 DIAGNOSIS — Z8616 Personal history of COVID-19: Secondary | ICD-10-CM

## 2021-09-12 DIAGNOSIS — I5031 Acute diastolic (congestive) heart failure: Secondary | ICD-10-CM | POA: Diagnosis present

## 2021-09-12 DIAGNOSIS — Z85828 Personal history of other malignant neoplasm of skin: Secondary | ICD-10-CM

## 2021-09-12 DIAGNOSIS — E039 Hypothyroidism, unspecified: Secondary | ICD-10-CM

## 2021-09-12 DIAGNOSIS — E1122 Type 2 diabetes mellitus with diabetic chronic kidney disease: Secondary | ICD-10-CM | POA: Diagnosis present

## 2021-09-12 DIAGNOSIS — M8458XA Pathological fracture in neoplastic disease, other specified site, initial encounter for fracture: Secondary | ICD-10-CM | POA: Diagnosis present

## 2021-09-12 DIAGNOSIS — Z8249 Family history of ischemic heart disease and other diseases of the circulatory system: Secondary | ICD-10-CM

## 2021-09-12 DIAGNOSIS — J9601 Acute respiratory failure with hypoxia: Secondary | ICD-10-CM

## 2021-09-12 DIAGNOSIS — C160 Malignant neoplasm of cardia: Secondary | ICD-10-CM | POA: Diagnosis present

## 2021-09-12 DIAGNOSIS — Z90722 Acquired absence of ovaries, bilateral: Secondary | ICD-10-CM

## 2021-09-12 DIAGNOSIS — I214 Non-ST elevation (NSTEMI) myocardial infarction: Secondary | ICD-10-CM

## 2021-09-12 DIAGNOSIS — D631 Anemia in chronic kidney disease: Secondary | ICD-10-CM | POA: Diagnosis present

## 2021-09-12 DIAGNOSIS — R188 Other ascites: Secondary | ICD-10-CM | POA: Diagnosis present

## 2021-09-12 DIAGNOSIS — E1165 Type 2 diabetes mellitus with hyperglycemia: Secondary | ICD-10-CM | POA: Diagnosis present

## 2021-09-12 DIAGNOSIS — C799 Secondary malignant neoplasm of unspecified site: Secondary | ICD-10-CM | POA: Diagnosis not present

## 2021-09-12 DIAGNOSIS — Z923 Personal history of irradiation: Secondary | ICD-10-CM

## 2021-09-12 DIAGNOSIS — Z8543 Personal history of malignant neoplasm of ovary: Secondary | ICD-10-CM

## 2021-09-12 DIAGNOSIS — C800 Disseminated malignant neoplasm, unspecified: Secondary | ICD-10-CM | POA: Diagnosis not present

## 2021-09-12 DIAGNOSIS — R000 Tachycardia, unspecified: Secondary | ICD-10-CM | POA: Diagnosis present

## 2021-09-12 DIAGNOSIS — Z86711 Personal history of pulmonary embolism: Secondary | ICD-10-CM

## 2021-09-12 DIAGNOSIS — C7951 Secondary malignant neoplasm of bone: Secondary | ICD-10-CM | POA: Diagnosis present

## 2021-09-12 DIAGNOSIS — Z7189 Other specified counseling: Secondary | ICD-10-CM

## 2021-09-12 DIAGNOSIS — C786 Secondary malignant neoplasm of retroperitoneum and peritoneum: Secondary | ICD-10-CM | POA: Diagnosis present

## 2021-09-12 DIAGNOSIS — I350 Nonrheumatic aortic (valve) stenosis: Secondary | ICD-10-CM | POA: Diagnosis present

## 2021-09-12 DIAGNOSIS — Z20822 Contact with and (suspected) exposure to covid-19: Secondary | ICD-10-CM | POA: Diagnosis present

## 2021-09-12 DIAGNOSIS — Z515 Encounter for palliative care: Principal | ICD-10-CM

## 2021-09-12 DIAGNOSIS — Z9081 Acquired absence of spleen: Secondary | ICD-10-CM

## 2021-09-12 DIAGNOSIS — Z79899 Other long term (current) drug therapy: Secondary | ICD-10-CM

## 2021-09-12 DIAGNOSIS — J910 Malignant pleural effusion: Secondary | ICD-10-CM | POA: Diagnosis present

## 2021-09-12 DIAGNOSIS — Z90710 Acquired absence of both cervix and uterus: Secondary | ICD-10-CM

## 2021-09-12 DIAGNOSIS — J810 Acute pulmonary edema: Secondary | ICD-10-CM

## 2021-09-12 DIAGNOSIS — I132 Hypertensive heart and chronic kidney disease with heart failure and with stage 5 chronic kidney disease, or end stage renal disease: Secondary | ICD-10-CM | POA: Diagnosis present

## 2021-09-12 DIAGNOSIS — Z833 Family history of diabetes mellitus: Secondary | ICD-10-CM

## 2021-09-12 DIAGNOSIS — K632 Fistula of intestine: Secondary | ICD-10-CM

## 2021-09-12 DIAGNOSIS — Z8571 Personal history of Hodgkin lymphoma: Secondary | ICD-10-CM

## 2021-09-12 DIAGNOSIS — K219 Gastro-esophageal reflux disease without esophagitis: Secondary | ICD-10-CM | POA: Diagnosis present

## 2021-09-12 DIAGNOSIS — R0602 Shortness of breath: Secondary | ICD-10-CM | POA: Diagnosis not present

## 2021-09-12 DIAGNOSIS — N186 End stage renal disease: Secondary | ICD-10-CM | POA: Diagnosis present

## 2021-09-12 DIAGNOSIS — R638 Other symptoms and signs concerning food and fluid intake: Secondary | ICD-10-CM | POA: Diagnosis present

## 2021-09-12 DIAGNOSIS — R778 Other specified abnormalities of plasma proteins: Secondary | ICD-10-CM

## 2021-09-12 DIAGNOSIS — G893 Neoplasm related pain (acute) (chronic): Secondary | ICD-10-CM | POA: Diagnosis present

## 2021-09-12 DIAGNOSIS — Z7984 Long term (current) use of oral hypoglycemic drugs: Secondary | ICD-10-CM

## 2021-09-12 DIAGNOSIS — I10 Essential (primary) hypertension: Secondary | ICD-10-CM

## 2021-09-12 DIAGNOSIS — E785 Hyperlipidemia, unspecified: Secondary | ICD-10-CM | POA: Diagnosis present

## 2021-09-12 DIAGNOSIS — Z87891 Personal history of nicotine dependence: Secondary | ICD-10-CM

## 2021-09-12 DIAGNOSIS — J90 Pleural effusion, not elsewhere classified: Secondary | ICD-10-CM

## 2021-09-12 DIAGNOSIS — Z803 Family history of malignant neoplasm of breast: Secondary | ICD-10-CM

## 2021-09-12 DIAGNOSIS — Z9621 Cochlear implant status: Secondary | ICD-10-CM | POA: Diagnosis present

## 2021-09-12 DIAGNOSIS — Z66 Do not resuscitate: Secondary | ICD-10-CM | POA: Diagnosis present

## 2021-09-12 DIAGNOSIS — Z8541 Personal history of malignant neoplasm of cervix uteri: Secondary | ICD-10-CM

## 2021-09-12 DIAGNOSIS — Z7985 Long-term (current) use of injectable non-insulin antidiabetic drugs: Secondary | ICD-10-CM

## 2021-09-12 DIAGNOSIS — H9190 Unspecified hearing loss, unspecified ear: Secondary | ICD-10-CM | POA: Diagnosis present

## 2021-09-12 DIAGNOSIS — Z8049 Family history of malignant neoplasm of other genital organs: Secondary | ICD-10-CM

## 2021-09-12 DIAGNOSIS — Z8673 Personal history of transient ischemic attack (TIA), and cerebral infarction without residual deficits: Secondary | ICD-10-CM

## 2021-09-12 DIAGNOSIS — Z9221 Personal history of antineoplastic chemotherapy: Secondary | ICD-10-CM

## 2021-09-12 DIAGNOSIS — Z79890 Hormone replacement therapy (postmenopausal): Secondary | ICD-10-CM

## 2021-09-12 LAB — COMPREHENSIVE METABOLIC PANEL
ALT: 32 U/L (ref 0–44)
AST: 50 U/L — ABNORMAL HIGH (ref 15–41)
Albumin: 2.8 g/dL — ABNORMAL LOW (ref 3.5–5.0)
Alkaline Phosphatase: 636 U/L — ABNORMAL HIGH (ref 38–126)
Anion gap: 12 (ref 5–15)
BUN: 17 mg/dL (ref 8–23)
CO2: 21 mmol/L — ABNORMAL LOW (ref 22–32)
Calcium: 8.1 mg/dL — ABNORMAL LOW (ref 8.9–10.3)
Chloride: 98 mmol/L (ref 98–111)
Creatinine, Ser: 0.82 mg/dL (ref 0.44–1.00)
GFR, Estimated: >60 mL/min (ref >60)
Glucose, Bld: 347 mg/dL — ABNORMAL HIGH (ref 70–99)
Potassium: 4.3 mmol/L (ref 3.5–5.1)
Sodium: 131 mmol/L — ABNORMAL LOW (ref 135–145)
Total Bilirubin: 0.4 mg/dL (ref 0.3–1.2)
Total Protein: 5.6 g/dL — ABNORMAL LOW (ref 6.5–8.1)

## 2021-09-12 LAB — CBC WITH DIFFERENTIAL/PLATELET
Abs Immature Granulocytes: 0.8 10*3/uL — ABNORMAL HIGH (ref 0.00–0.07)
Basophils Absolute: 0 10*3/uL (ref 0.0–0.1)
Basophils Relative: 0 %
Eosinophils Absolute: 0 10*3/uL (ref 0.0–0.5)
Eosinophils Relative: 0 %
HCT: 26.3 % — ABNORMAL LOW (ref 36.0–46.0)
Hemoglobin: 8.6 g/dL — ABNORMAL LOW (ref 12.0–15.0)
Lymphocytes Relative: 4 %
Lymphs Abs: 0.8 10*3/uL (ref 0.7–4.0)
MCH: 29.6 pg (ref 26.0–34.0)
MCHC: 32.7 g/dL (ref 30.0–36.0)
MCV: 90.4 fL (ref 80.0–100.0)
Metamyelocytes Relative: 1 %
Monocytes Absolute: 1.1 10*3/uL — ABNORMAL HIGH (ref 0.1–1.0)
Monocytes Relative: 6 %
Myelocytes: 3 %
Neutro Abs: 16.4 10*3/uL — ABNORMAL HIGH (ref 1.7–7.7)
Neutrophils Relative %: 86 %
Platelets: 116 10*3/uL — ABNORMAL LOW (ref 150–400)
RBC: 2.91 MIL/uL — ABNORMAL LOW (ref 3.87–5.11)
RDW: 20.2 % — ABNORMAL HIGH (ref 11.5–15.5)
WBC: 19.1 10*3/uL — ABNORMAL HIGH (ref 4.0–10.5)
nRBC: 4.5 % — ABNORMAL HIGH (ref 0.0–0.2)
nRBC: 6 /100 WBC — ABNORMAL HIGH

## 2021-09-12 LAB — LIPASE, BLOOD: Lipase: 17 U/L (ref 11–51)

## 2021-09-12 LAB — URINALYSIS, ROUTINE W REFLEX MICROSCOPIC
Bacteria, UA: NONE SEEN
Bilirubin Urine: NEGATIVE
Glucose, UA: 150 mg/dL — AB
Hgb urine dipstick: NEGATIVE
Ketones, ur: 5 mg/dL — AB
Leukocytes,Ua: NEGATIVE
Nitrite: NEGATIVE
Protein, ur: 30 mg/dL — AB
Specific Gravity, Urine: 1.03 (ref 1.005–1.030)
pH: 5 (ref 5.0–8.0)

## 2021-09-12 LAB — RESP PANEL BY RT-PCR (FLU A&B, COVID) ARPGX2
Influenza A by PCR: NEGATIVE
Influenza B by PCR: NEGATIVE
SARS Coronavirus 2 by RT PCR: NEGATIVE

## 2021-09-12 LAB — TROPONIN I (HIGH SENSITIVITY)
Troponin I (High Sensitivity): 1781 ng/L (ref <18)
Troponin I (High Sensitivity): 2181 ng/L (ref <18)

## 2021-09-12 LAB — BRAIN NATRIURETIC PEPTIDE: B Natriuretic Peptide: 1625.8 pg/mL — ABNORMAL HIGH (ref 0.0–100.0)

## 2021-09-12 MED ORDER — SODIUM CHLORIDE 0.9% FLUSH: 9.0000 mL | INTRAVENOUS | Status: DC | PRN

## 2021-09-12 MED ORDER — SODIUM CHLORIDE 0.9 % IV BOLUS
500.0000 mL | Freq: Once | INTRAVENOUS | Status: AC
Start: 2021-09-12 — End: 2021-09-12
  Administered 2021-09-12: 500 mL via INTRAVENOUS

## 2021-09-12 MED ORDER — POLYETHYLENE GLYCOL 3350 17 G PO PACK: 17.0000 g | PACK | Freq: Every day | ORAL | Status: DC | PRN

## 2021-09-12 MED ORDER — HALOPERIDOL LACTATE 5 MG/ML IJ SOLN
2.0000 mg | Freq: Four times a day (QID) | INTRAMUSCULAR | Status: DC | PRN
  Administered 2021-09-14 – 2021-09-15 (×3): 2 mg via INTRAVENOUS
  Filled 2021-09-12 (×3): qty 1

## 2021-09-12 MED ORDER — HYDROMORPHONE 1 MG/ML IV SOLN
INTRAVENOUS | Status: DC
  Filled 2021-09-12: qty 30

## 2021-09-12 MED ORDER — GLYCOPYRROLATE 1 MG PO TABS: 1.0000 mg | ORAL_TABLET | ORAL | Status: DC | PRN

## 2021-09-12 MED ORDER — PANTOPRAZOLE SODIUM 40 MG IV SOLR
40.0000 mg | INTRAVENOUS | Status: DC
Start: 2021-09-12 — End: 2021-09-12
  Administered 2021-09-12: 40 mg via INTRAVENOUS
  Filled 2021-09-12: qty 10

## 2021-09-12 MED ORDER — ONDANSETRON HCL 4 MG/2ML IJ SOLN: 4.0000 mg | Freq: Four times a day (QID) | INTRAMUSCULAR | Status: DC | PRN

## 2021-09-12 MED ORDER — ALBUTEROL SULFATE (2.5 MG/3ML) 0.083% IN NEBU: 2.5000 mg | INHALATION_SOLUTION | RESPIRATORY_TRACT | Status: DC | PRN

## 2021-09-12 MED ORDER — FUROSEMIDE 10 MG/ML IJ SOLN
60.0000 mg | Freq: Once | INTRAMUSCULAR | Status: AC
  Administered 2021-09-12: 60 mg via INTRAVENOUS
  Filled 2021-09-12: qty 6

## 2021-09-12 MED ORDER — POLYVINYL ALCOHOL 1.4 % OP SOLN: 1.0000 [drp] | Freq: Four times a day (QID) | OPHTHALMIC | Status: DC | PRN

## 2021-09-12 MED ORDER — NALOXONE HCL 0.4 MG/ML IJ SOLN: 0.4000 mg | INTRAMUSCULAR | Status: DC | PRN

## 2021-09-12 MED ORDER — ONDANSETRON HCL 4 MG/2ML IJ SOLN
4.0000 mg | Freq: Four times a day (QID) | INTRAMUSCULAR | Status: DC | PRN
  Administered 2021-09-12: 4 mg via INTRAVENOUS
  Filled 2021-09-12: qty 2

## 2021-09-12 MED ORDER — HYDROMORPHONE HCL 1 MG/ML IJ SOLN
1.0000 mg | Freq: Once | INTRAMUSCULAR | Status: AC
  Administered 2021-09-12: 1 mg via INTRAVENOUS
  Filled 2021-09-12: qty 1

## 2021-09-12 MED ORDER — FENTANYL 50 MCG/HR TD PT72
1.0000 | MEDICATED_PATCH | TRANSDERMAL | Status: DC
  Administered 2021-09-12 – 2021-09-15 (×2): 1 via TRANSDERMAL
  Filled 2021-09-12 (×2): qty 1

## 2021-09-12 MED ORDER — HALOPERIDOL LACTATE 2 MG/ML PO CONC: 2.0000 mg | Freq: Four times a day (QID) | ORAL | Status: DC | PRN

## 2021-09-12 MED ORDER — GLYCOPYRROLATE 0.2 MG/ML IJ SOLN
0.2000 mg | INTRAMUSCULAR | Status: DC | PRN
  Filled 2021-09-12: qty 1

## 2021-09-12 MED ORDER — HYDROMORPHONE 1 MG/ML IV SOLN: INTRAVENOUS | Status: DC

## 2021-09-12 MED ORDER — LORAZEPAM 2 MG/ML IJ SOLN
1.0000 mg | Freq: Four times a day (QID) | INTRAMUSCULAR | Status: DC | PRN
Start: 2021-09-12 — End: 2021-09-12

## 2021-09-12 MED ORDER — ONDANSETRON HCL 4 MG/2ML IJ SOLN
4.0000 mg | Freq: Once | INTRAMUSCULAR | Status: AC
  Administered 2021-09-12: 4 mg via INTRAVENOUS
  Filled 2021-09-12: qty 2

## 2021-09-12 MED ORDER — BIOTENE DRY MOUTH MT LIQD
15.0000 mL | Freq: Two times a day (BID) | OROMUCOSAL | Status: DC
  Administered 2021-09-13 – 2021-09-17 (×5): 15 mL via TOPICAL

## 2021-09-12 MED ORDER — DIPHENHYDRAMINE HCL 50 MG/ML IJ SOLN: 12.5000 mg | Freq: Four times a day (QID) | INTRAMUSCULAR | Status: DC | PRN

## 2021-09-12 MED ORDER — ONDANSETRON HCL 4 MG PO TABS: 4.0000 mg | ORAL_TABLET | Freq: Four times a day (QID) | ORAL | Status: DC | PRN

## 2021-09-12 MED ORDER — MORPHINE SULFATE (PF) 2 MG/ML IV SOLN
1.0000 mg | INTRAVENOUS | Status: DC | PRN
  Administered 2021-09-12 (×2): 1 mg via INTRAVENOUS
  Filled 2021-09-12 (×2): qty 1

## 2021-09-12 MED ORDER — IOHEXOL 350 MG/ML SOLN
80.0000 mL | Freq: Once | INTRAVENOUS | Status: AC | PRN
Start: 2021-09-12 — End: 2021-09-12
  Administered 2021-09-12: 80 mL via INTRAVENOUS

## 2021-09-12 MED ORDER — PROCHLORPERAZINE EDISYLATE 10 MG/2ML IJ SOLN: 10.0000 mg | Freq: Four times a day (QID) | INTRAMUSCULAR | Status: DC | PRN

## 2021-09-12 MED ORDER — HALOPERIDOL 1 MG PO TABS: 2.0000 mg | ORAL_TABLET | Freq: Four times a day (QID) | ORAL | Status: DC | PRN

## 2021-09-12 MED ORDER — IPRATROPIUM-ALBUTEROL 0.5-2.5 (3) MG/3ML IN SOLN
3.0000 mL | Freq: Once | RESPIRATORY_TRACT | Status: AC
  Administered 2021-09-12: 3 mL via RESPIRATORY_TRACT
  Filled 2021-09-12: qty 3

## 2021-09-12 MED ORDER — LORAZEPAM 2 MG/ML IJ SOLN
1.0000 mg | INTRAMUSCULAR | Status: DC | PRN
Start: 2021-09-12 — End: 2021-09-17
  Administered 2021-09-13 – 2021-09-17 (×11): 1 mg via INTRAVENOUS
  Filled 2021-09-12 (×11): qty 1

## 2021-09-12 MED ORDER — ONDANSETRON HCL 4 MG/2ML IJ SOLN
4.0000 mg | Freq: Once | INTRAMUSCULAR | Status: AC
Start: 2021-09-12 — End: 2021-09-12
  Administered 2021-09-12: 4 mg via INTRAVENOUS
  Filled 2021-09-12: qty 2

## 2021-09-12 MED ORDER — DIPHENHYDRAMINE HCL 12.5 MG/5ML PO ELIX: 12.5000 mg | ORAL_SOLUTION | Freq: Four times a day (QID) | ORAL | Status: DC | PRN

## 2021-09-12 MED ORDER — ACETAMINOPHEN 325 MG PO TABS: 650.0000 mg | ORAL_TABLET | Freq: Four times a day (QID) | ORAL | Status: DC | PRN

## 2021-09-12 MED ORDER — HYDROMORPHONE HCL 1 MG/ML IJ SOLN
2.0000 mg | INTRAMUSCULAR | Status: DC | PRN
  Administered 2021-09-12: 2 mg via INTRAVENOUS
  Filled 2021-09-12 (×2): qty 2

## 2021-09-12 MED ORDER — ACETAMINOPHEN 650 MG RE SUPP: 650.0000 mg | Freq: Four times a day (QID) | RECTAL | Status: DC | PRN

## 2021-09-12 MED ORDER — FUROSEMIDE 10 MG/ML IJ SOLN
60.0000 mg | Freq: Two times a day (BID) | INTRAMUSCULAR | Status: DC
  Administered 2021-09-12 – 2021-09-16 (×8): 60 mg via INTRAVENOUS
  Filled 2021-09-12 (×8): qty 6

## 2021-09-12 MED ORDER — GLYCOPYRROLATE 0.2 MG/ML IJ SOLN
0.2000 mg | INTRAMUSCULAR | Status: DC | PRN
  Administered 2021-09-13 – 2021-09-18 (×2): 0.2 mg via INTRAVENOUS
  Filled 2021-09-12 (×2): qty 1

## 2021-09-12 NOTE — H&P (Signed)
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  Patient also has a history of ovarian cancer and cervical cancer (S/P TAH and BSO 1974) as well as Hodgkin's lymphoma status post chemo and mantle radiation in 1987, moderate aortic stenosis (Echo 07/2021), history of jejunostomy tube placement for severe dysphagia complicated by development of small bowel fistula status post removal of the jejunostomy tube in early 2023, hypothyroidism, non-insulin-dependent diabetes mellitus type 2 (hgbA1C 6.4% 07/2021), history of bilateral PEs 02/2019 (no longer on anticoagulation), stroke 02/2019, gastroesophageal reflux disease, hypertension, hyperlipidemia presented to [facility] emergency department due to severe intractable pain.  Of note, patient was recently hospitalized at [facility] on the oncology service from 8/25 until 9/1.  Patient was hospitalized for severe abdominal pain thought to be secondary to the development of multiple loculated fluid collections thought to be due to malignant ascites from metastatic cancer.  Patient's hospital course was complicated by nausea and vomiting throughout managed with combination therapy of Zofran, Compazine and Zyprexa.  Patient was found to be suffering from sequela of end-stage metastatic disease.  Efforts were palliative and patient was eventually transitioned to home-going hospice and discharged on 9/1.  Unfortunately patient now returns with multiple complaints including shortness of breath, recurrent nausea  and vomiting and severe chest and abdominal pain.  Patient and family deny any new sick contacts, contacts with confirmed COVID-19 infection or recent travel.  Oral intake is continued to be poor complicated by bouts of nausea and vomiting.  Due to ongoing symptoms the patient eventually presented to [facility] emergency department for evaluation via EMS.    Upon evaluation in the emergency department patient continued to have intractable symptoms and was managed with intravenous Lasix as well as several doses of intravenous Dilaudid and Zofran.  CT imaging of the chest was performed revealing large bilateral pleural effusions and asymmetric multifocal airspace infiltrates.  There is no evidence of a pulmonary embolism.  Family is requesting hospitalization for intractable pain with possible need for inpatient hospice.  Hospitalist group has now been called to assess the patient for admission to the hospital.  Review of Systems: Review of Systems  Constitutional:  Positive for malaise/fatigue and weight loss.  Cardiovascular:  Positive for chest pain.  Gastrointestinal:  Positive for abdominal pain, nausea and vomiting.  Neurological:  Positive for weakness.     Past Medical History:  Diagnosis Date   Acquired deafness    Cervical cancer ([place]) 1075   Cervical cancer (HCC)    CVA (cerebral vascular accident) ([place]) 02/2019   Diabetes mellitus without complication (HCC)    Dyspareunia    GERD (gastroesophageal reflux disease)    Heart murmur    Hodgkin disease ([place]) 1987   Ovarian cancer ([place])    age 71   Psoriasis    Thyroid disease     Past Surgical History:  Procedure Laterality Date   ABDOMINAL HYSTERECTOMY     TAH/BSO at age 86 d/t cervical CA   BASAL CELL CARCINOMA EXCISION     face  BOWEL RESECTION     followed splenectomy.  had bowel obstruction.   COCHLEAR IMPLANT  1983   Dr. Thornell Mule   SPLENECTOMY     a/w Hodgkin's lymphoma    Social History:  reports that  she quit smoking about 21 years ago. Her smoking use included cigarettes. She has a 20.00 pack-year smoking history. She has never used smokeless tobacco. She reports that she does not drink alcohol and does not use drugs.  Allergies  Allergen Reactions   Azithromycin Other (See Comments)    Not effective   Depakote [Divalproex Sodium] Other (See Comments)    Unknown    Topamax [Topiramate] Other (See Comments)    Unknown    Vancomycin Other (See Comments)    Unknown    Family History  Problem Relation Age of Onset   Diabetes Mother    Heart disease Mother    Stroke Mother    Diabetes Brother    Heart attack Brother    Diabetes Sister    Cervical cancer Sister    Diabetes Sister    Cervical cancer Sister    Diabetes Sister    Diabetes Brother    Breast cancer Paternal Uncle    Breast cancer Maternal Aunt     Prior to Admission medications   Medication Sig Start Date End Date Taking? Authorizing Provider  albuterol (VENTOLIN HFA) 108 (90 Base) MCG/ACT inhaler Inhale 2 puffs into the lungs every 6 (six) hours as needed. 07/05/19   Julian Hy, DO  ciprofloxacin-dexamethasone (CIPRODEX) OTIC suspension as needed. 03/17/20   [provider]  clopidogrel (PLAVIX) 75 MG tablet Take 1 tablet (75 mg total) by mouth daily. 08/18/19   Garvin Fila, MD  clotrimazole-betamethasone (LOTRISONE) cream Apply topically as needed. 11/24/19   [provider]  Cyanocobalamin 1000 MCG/ML KIT Inject as directed every 30 (thirty) days.    [provider]  dexlansoprazole (DEXILANT) 60 MG capsule Take 60 mg by mouth daily.    [provider]  diazepam (VALIUM) 2 MG tablet as needed. 03/08/20   [provider]  Dulaglutide (TRULICITY) 1.5 WR/6.0AV SOPN Inject 1.5 mg into the skin once a week.     [provider]  ferrous sulfate 325 (65 FE) MG tablet Take 1 tablet (325 mg total) by mouth daily with breakfast. Please take with a source of vitamin  C (orange juice or tablet with 500 units of Vitamin C). Patient taking differently: Take 325 mg by mouth every other day. Please take with a source of vitamin C (orange juice or tablet with 500 units of Vitamin C). 11/19/18   Orson Slick, MD  fluticasone (FLONASE) 50 MCG/ACT nasal spray as needed.    [provider]  folic acid (FOLVITE) 409 MCG tablet Take 800 mcg by mouth daily.    [provider]  levothyroxine (SYNTHROID) 175 MCG tablet Take 175 mcg by mouth daily before breakfast.    [provider]  LINZESS 290 MCG CAPS capsule Take 290 mcg by mouth every morning. 02/20/20   [provider]  loratadine (CLARITIN) 10 MG tablet Take 10 mg by mouth as needed. 02/22/20   [provider]  metFORMIN (GLUCOPHAGE) 500 MG tablet Take 500 mg by mouth 2 (two) times daily with a meal.    [provider]  metoprolol succinate (TOPROL-XL) 25 MG 24 hr tablet Take 25 mg by mouth daily. 12/24/19   [provider]  metroNIDAZOLE (METROCREAM) 0.75 % cream Apply  1 application topically as needed. 12/23/19   [provider]  nystatin (MYCOSTATIN/NYSTOP) powder as needed.    [provider]  rosuvastatin (CRESTOR) 20 MG tablet Take 1 tablet (20 mg total) by mouth daily. 02/24/19   Ghimire, Henreitta Leber, MD  Vitamin D, Ergocalciferol, (DRISDOL) 1.25 MG (50000 UT) CAPS capsule Take 50,000 Units by mouth every 7 (seven) days.    [provider]    Physical Exam:  Vitals:   09/25/2021 0445 09/08/2021 0655 10/03/2021 0800 09/26/2021 0900  BP: (!) 150/82  125/75 133/76  Pulse: (!) 123  (!) 118 (!) 119  Resp: 11  (!) 22 18  Temp:  97.7 F (36.5 C)    TempSrc:  Oral    SpO2: 91%  94% 95%    Constitutional: Lethargic but arousable, oriented x 3,  In distress due to pain and nausea Skin: Increased skin pallor, no rashes, no lesions, poor skin turgor noted. Eyes: Pupils are equally reactive to light.  No evidence of scleral icterus  or conjunctival pallor.  ENMT: Dry mucous membranes noted.  Posterior pharynx clear of any exudate or lesions.   Neck: normal, supple, no masses, no thyromegaly.  No evidence of jugular venous distension.   Respiratory: Diminished breath sounds at the bases with bibasilar rales.  No significant wheezing.  Normal respiratory effort. No accessory muscle use.  Cardiovascular: Tachycardic rate, regular rhythm, no murmurs / rubs / gallops. Trace pedal edema, 2+ pedal pulses. No carotid bruits.  Chest:   Nontender without crepitus or deformity.   Back:   Nontender without crepitus or deformity. Abdomen: Abdomen diffusely tender but soft.  Left anterior abdominal ostomy bag in place with brown watery output.     Positive bowel sounds noted in all quadrants.   Musculoskeletal: No joint deformity upper and lower extremities. Good ROM, no contractures. Poor muscle tone.  Neurologic: Lethargic but arousable, Sensation intact.  Patient moving all 4 extremities spontaneously.  Patient is following all commands.  Patient is responsive to verbal stimuli.   Psychiatric: Depressed mood with flat affect  Patient seems to possess insight as to their current situation.    Data Reviewed:  I have personally reviewed and interpreted labs, imaging.  Significant findings are:  Urinalysis reveals no evidence of infection. BNP 1625 COVID-19 PCR testing negative Initial troponin 1781, second troponin 2181 Lipase 17 CBC revealing white blood cell count of 19.1, hemoglobin 8.6, hematocrit 26.3, platelet count 116 Chemistry [place] sodium 131, potassium 4.3, chloride 98, bicarbonate 21, BUN 17, creatinine 0.82, alkaline phosphatase 636  EKG: Personally reviewed.  Rhythm is sinus tachycardia with heart rate of 132 bpm.  No dynamic ST segment changes appreciated.    Assessment and Plan: * Intractable pain Patient suffering from severe diffuse abdominal pain with intractable nausea and vomiting as a result of advanced  progressing metastatic disease Prognosis extremely poor Patient/Family unable to achieve symptomatic relief at home.  Requesting hospitalization to further pursue comfort based measures Daughter and husband are at the bedside, are engaged and are all in agreement with the patient's desire to focus on comfort.  Providing patient with opiate based analgesics, anxiolytics, antiemetics and bronchodilator therapy for significant symptoms Palliative care consultation placed  Acute respiratory failure with hypoxia ([place]) Hypoxic on arrival Likely due to progressive malignant effusions EDP ordered lasix with limited to no response Very low rate O2 for now which would should be weaned off today comfort measures as above  NSTEMI (non-ST elevated myocardial infarction) (HCC) Markedly elevated  troponins that are continuing to rise, possibly due to an NSTEMI in the setting of underlying CAD and severe physiological strain of advanced malignancy No dynamic ST change on ECG Patient complaining of chest pain, will treat with PRN morphine Comfort measures as above    Widespread metastatic malignant neoplastic disease ([place]) Gastric/GE Junction adenocarcinoma seems to be the primary culprit for the patients numerous sequelae/complications at this point Evidence of extensive osseus spread, malignant effusions and peritoneal carcinomatosis Prognosis extremely poor Comfort measures in accordance with patient/family wishes, as noted above  Pleural effusion, bilateral Likely malignant EDP gave a dose of Lasix which seems to not be having much effect, as expected Comfort measures as above  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   Consults: Palliative Care Consult  Severity of Illness:  The appropriate patient status for this patient is OBSERVATION. Observation status is judged to be reasonable and necessary in order to provide the required intensity of service to ensure the patient's safety. The patient's presenting symptoms, physical exam findings, and initial radiographic and laboratory data in the context of their medical condition is felt to place them at decreased risk for further clinical deterioration. Furthermore, it is anticipated that the patient will be medically stable for discharge from the hospital within 2 midnights of admission.   Author:  Vernelle Emerald MD  09/15/2021 9:29 AM

## 2021-09-12 NOTE — Assessment & Plan Note (Addendum)
Furosemide for palliative de-congestion.

## 2021-09-12 NOTE — Assessment & Plan Note (Addendum)
Patient with pulmonary edema, bilateral pleural effusions, cardiogenic. Complicated with acute respiratory failure.  Plan to continue diuresis for symptomatic control Continue with furosemide to 60 mg IV q12 hrs  Echocardiogram from 2021 with preserved LV systolic function Considering patient's prognosis will hold on further cardiac workup.

## 2021-09-12 NOTE — ED Provider Notes (Signed)
[facility] EMERGENCY DEPARTMENT Provider Note   CSN: [id number] Arrival date & time: 09/26/2021  0017     History  Chief Complaint  Patient presents with   Shortness of [place] is a 66 y.o. female.  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 Poor PO intake, decreased appetite. Placed on 3L [place] PTA (no home o2 requirement).   She was admitted 8/25 at [facility] 2/2 intractable pain, nausea and vomiting; Hgb was 8.0, WBC 10.2 on 9/1  during that admission.   Patient to the ED accompanied by her daughter and spouse primary concern for dyspnea, increased pain.  Patient reports increased difficulty breathing over the past 24 to 48 hours.  Does not use oxygen at home.  Worsening pain to her epigastrium, chest tightness.  She was recently discharged from OSH to hospice but daughter reports they are having difficulty controlling her pain at home medications they were discharged home with.  Daughter reports patient with reduced p.o. intake over the past 24 hours, no fevers but some chills were reported by the patient.  Daughter does report patient with history of prior PE, no longer on anticoagulation.     Past Medical History:  Diagnosis Date   Acquired deafness    Cervical cancer ([place]) 1075   Cervical cancer (HCC)    CVA (cerebral vascular accident) ([place]) 02/2019   Diabetes mellitus without complication (HCC)    Dyspareunia    GERD (gastroesophageal reflux disease)    Heart murmur    Hodgkin disease ([place]) 1987   Ovarian cancer ([place])    age 27   Psoriasis    Thyroid disease     Past Surgical History:  Procedure Laterality Date   ABDOMINAL HYSTERECTOMY     TAH/BSO at age 83 d/t  cervical CA   BASAL CELL CARCINOMA EXCISION     face   BOWEL RESECTION     followed splenectomy.  had bowel obstruction.   COCHLEAR IMPLANT  1983   Dr. Thornell Mule   SPLENECTOMY     a/w Hodgkin's lymphoma     The history is provided by the patient. No language interpreter was used.  Shortness of Breath Associated symptoms: abdominal pain, chest pain and vomiting   Associated symptoms: no headaches and no neck pain        Home Medications Prior to Admission medications   Medication Sig Start Date End Date Taking? Authorizing Provider  albuterol (VENTOLIN HFA) 108 (90 Base) MCG/ACT inhaler Inhale 2 puffs into the lungs every 6 (six) hours as needed. 07/05/19   Julian Hy, DO  ciprofloxacin-dexamethasone (CIPRODEX) OTIC suspension as needed. 03/17/20   [provider]  clopidogrel (PLAVIX) 75 MG tablet Take 1 tablet (75 mg total) by mouth daily. 08/18/19   Garvin Fila, MD  clotrimazole-betamethasone (LOTRISONE) cream Apply topically as needed. 11/24/19   [provider]  Cyanocobalamin 1000 MCG/ML KIT Inject as directed every 30 (thirty) days.    [provider]  dexlansoprazole (DEXILANT) 60 MG capsule Take 60 mg by mouth daily.    [provider]  diazepam (VALIUM) 2 MG tablet as needed. 03/08/20   [provider]  Dulaglutide (TRULICITY) 1.5 IR/4.8NI SOPN Inject 1.5 mg into the skin once a  week.     [provider]  ferrous sulfate 325 (65 FE) MG tablet Take 1 tablet (325 mg total) by mouth daily with breakfast. Please take with a source of vitamin C (orange juice or tablet with 500 units of Vitamin C). Patient taking differently: Take 325 mg by mouth every other day. Please take with a source of vitamin C (orange juice or tablet with 500 units of Vitamin C). 11/19/18   Orson Slick, MD  fluticasone (FLONASE) 50 MCG/ACT nasal spray as needed.    [provider]  folic acid (FOLVITE) 696 MCG tablet Take 800 mcg by mouth  daily.    [provider]  levothyroxine (SYNTHROID) 175 MCG tablet Take 175 mcg by mouth daily before breakfast.    [provider]  LINZESS 290 MCG CAPS capsule Take 290 mcg by mouth every morning. 02/20/20   [provider]  loratadine (CLARITIN) 10 MG tablet Take 10 mg by mouth as needed. 02/22/20   [provider]  metFORMIN (GLUCOPHAGE) 500 MG tablet Take 500 mg by mouth 2 (two) times daily with a meal.    [provider]  metoprolol succinate (TOPROL-XL) 25 MG 24 hr tablet Take 25 mg by mouth daily. 12/24/19   [provider]  metroNIDAZOLE (METROCREAM) 0.75 % cream Apply 1 application topically as needed. 12/23/19   [provider]  nystatin (MYCOSTATIN/NYSTOP) powder as needed.    [provider]  rosuvastatin (CRESTOR) 20 MG tablet Take 1 tablet (20 mg total) by mouth daily. 02/24/19   Ghimire, Henreitta Leber, MD  Vitamin D, Ergocalciferol, (DRISDOL) 1.25 MG (50000 UT) CAPS capsule Take 50,000 Units by mouth every 7 (seven) days.    [provider]      Allergies    Azithromycin, Depakote [divalproex sodium], and Topamax [topiramate]    Review of Systems   Review of Systems  Constitutional:  Positive for appetite change, chills and fatigue.  Respiratory:  Positive for shortness of breath. Negative for choking.   Cardiovascular:  Positive for chest pain.  Gastrointestinal:  Positive for abdominal pain, nausea and vomiting.  Genitourinary:  Negative for difficulty urinating and dysuria.  Musculoskeletal:  Negative for neck pain and neck stiffness.  Neurological:  Negative for syncope and headaches.    Physical Exam Updated Vital Signs BP 125/75   Pulse (!) 118   Temp 97.7 F (36.5 C) (Oral)   Resp (!) 22   SpO2 94%  Physical Exam Vitals and nursing note reviewed.  Constitutional:      General: She is in acute distress.     Appearance: Normal appearance. She is ill-appearing.  HENT:     Head:  Normocephalic and atraumatic.     Right Ear: External ear normal.     Left Ear: External ear normal.     Nose: Nose normal.     Mouth/Throat:     Mouth: Mucous membranes are moist.  Eyes:     General: No scleral icterus.       Right eye: No discharge.        Left eye: No discharge.  Cardiovascular:     Rate and Rhythm: Regular rhythm. Tachycardia present.     Pulses: Normal pulses.     Heart sounds: Normal heart sounds.  Pulmonary:     Effort: Pulmonary effort is normal. Tachypnea present. No respiratory distress.     Breath sounds: Decreased air movement present. Decreased breath sounds present.     Comments: Coarse  breath sounds bilateral Abdominal:     General: Abdomen is flat.     Palpations: Abdomen is soft.     Tenderness: There is abdominal tenderness. There is no guarding or rebound.    Musculoskeletal:        General: Normal range of motion.     Cervical back: Normal range of motion.     Right lower leg: No edema.     Left lower leg: No edema.  Skin:    General: Skin is warm and dry.     Capillary Refill: Capillary refill takes less than 2 seconds.  Neurological:     Mental Status: She is alert and oriented to person, place, and time.     GCS: GCS eye subscore is 4. GCS verbal subscore is 5. GCS motor subscore is 6.  Psychiatric:        Mood and Affect: Mood normal.        Behavior: Behavior normal.     ED Results / Procedures / Treatments   Labs (all labs ordered are listed, but only abnormal results are displayed) Labs Reviewed  CBC WITH DIFFERENTIAL/PLATELET - Abnormal; Notable for the following components:      Result Value   WBC 19.1 (*)    RBC 2.91 (*)    Hemoglobin 8.6 (*)    HCT 26.3 (*)    RDW 20.2 (*)    Platelets 116 (*)    nRBC 4.5 (*)    Neutro Abs 16.4 (*)    Monocytes Absolute 1.1 (*)    nRBC 6 (*)    Abs Immature Granulocytes 0.80 (*)    All other components within normal limits  COMPREHENSIVE METABOLIC PANEL - Abnormal; Notable for  the following components:   Sodium 131 (*)    CO2 21 (*)    Glucose, Bld 347 (*)    Calcium 8.1 (*)    Total Protein 5.6 (*)    Albumin 2.8 (*)    AST 50 (*)    Alkaline Phosphatase 636 (*)    All other components within normal limits  URINALYSIS, ROUTINE W REFLEX MICROSCOPIC - Abnormal; Notable for the following components:   Glucose, UA 150 (*)    Ketones, ur 5 (*)    Protein, ur 30 (*)    All other components within normal limits  BRAIN NATRIURETIC PEPTIDE - Abnormal; Notable for the following components:   B Natriuretic Peptide 1,625.8 (*)    All other components within normal limits  TROPONIN I (HIGH SENSITIVITY) - Abnormal; Notable for the following components:   Troponin I (High Sensitivity) 1,781 (*)    All other components within normal limits  TROPONIN I (HIGH SENSITIVITY) - Abnormal; Notable for the following components:   Troponin I (High Sensitivity) 2,181 (*)    All other components within normal limits  RESP PANEL BY RT-PCR (FLU A&B, COVID) ARPGX2  LIPASE, BLOOD    EKG EKG Interpretation  Date/Time:  Thursday September 12 2021 04:58:33 EDT Ventricular Rate:  132 PR Interval:  106 QRS Duration: 79 QT Interval:  312 QTC Calculation: 463 R Axis:   73 Text Interpretation: Sinus tachycardia Borderline T wave abnormalities no stemi Confirmed by Wynona Dove ([id number]) on 09/19/2021 5:56:17 AM  Radiology CT Angio Chest PE W and/or Wo Contrast  Result Date: 09/11/2021 CLINICAL DATA:  Pulmonary embolism (PE) suspected, high prob; Abdominal pain, acute, nonlocalized. Nausea, vomiting. Gastric cancer peer EXAM: CT ANGIOGRAPHY CHEST CT ABDOMEN AND PELVIS WITH CONTRAST TECHNIQUE: Multidetector CT imaging  of the chest was performed using the standard protocol during bolus administration of intravenous contrast. Multiplanar CT image reconstructions and MIPs were obtained to evaluate the vascular anatomy. Multidetector CT imaging of the abdomen and pelvis was performed using the  standard protocol during bolus administration of intravenous contrast. RADIATION DOSE REDUCTION: This exam was performed according to the departmental dose-optimization program which includes automated exposure control, adjustment of the mA and/or kV according to patient size and/or use of iterative reconstruction technique. CONTRAST:  22m OMNIPAQUE IOHEXOL 350 MG/ML SOLN COMPARISON:  CT chest 03/16/2020, CT abdomen pelvis 11/10/2018 FINDINGS: CTA CHEST FINDINGS Cardiovascular: There is adequate opacification of the pulmonary arterial tree. No intraluminal filling defect identified to suggest acute pulmonary embolism. The central pulmonary arteries are of normal caliber. Moderate multi-vessel coronary artery calcification. Calcification of the aortic valve leaflets noted. Global cardiac size within normal limits. No pericardial effusion. Mild atherosclerotic calcification within the thoracic aorta. No aortic aneurysm. Left internal jugular chest port tip is seen at the superior cavoatrial junction. Mediastinum/Nodes: The thyroid gland is atrophic or absent. No pathologic thoracic adenopathy. There is soft tissue infiltration within the subcarinal region which may reflect inflammatory changes surrounding the mid esophagus. This is not well assessed on this examination due to the early phase of contrast enhancement. Lungs/Pleura: Large bilateral pleural effusions are present with compressive atelectasis of the lung bases bilaterally. Interlobular septal thickening is noted within the lung bases bilaterally in keeping with least some degree of interstitial pulmonary edema. There is marked thickening of the peribronchovascular interstitium and narrowing of the central airways which may reflect interstitial edema or inflammatory infiltrate in the setting of atypical infection and/or airway inflammation. There is asymmetric multifocal airspace infiltrate noted within the upper lobes bilaterally which are favored to  represent inflammatory infiltrate in the setting of acute infection. No pneumothorax. Musculoskeletal: Widespread sclerotic metastases have developed within the visualized axial skeleton. Superimposed compression deformity of T5 with mild loss of height as well as superior endplate fractures of TI4-P80are now identified likely representing pathologic fracture. No retropulsion or listhesis. Review of the MIP images confirms the above findings. CT ABDOMEN and PELVIS FINDINGS Hepatobiliary: Simple cyst within the left hepatic lobe. No enhancing intrahepatic mass. Gallbladder is unremarkable. No intra or extrahepatic biliary ductal dilation. Pancreas: Atrophic but unremarkable Spleen: Status post splenectomy. Tiny splenule noted within the left upper quadrant. Adrenals/Urinary Tract: The adrenal glands are unremarkable. The kidneys are normal in size and position. Mild bilateral renal cortical scarring. Interval development of a wedge-shaped cortical defect within the lower pole of the right kidney demonstrating a small amount of associated cortical atrophy suspicious for a subacute to remote cortical infarct. No enhancing intrarenal mass. No perinephric fluid collections or inflammatory stranding. No intrarenal or ureteral calculi. No hydronephrosis. The bladder is unremarkable. Stomach/Bowel: There is interval placement of an ostomy appliance within the left upper quadrant likely at the site of a jejunostomy tract. A percutaneous feeding catheter, however, is not identified at this time. The stomach, small bowel, and large bowel are otherwise unremarkable. The appendix is not clearly identified and is likely absent. Mild ascites has developed. Additionally, there is subtle nodularity of the omental fat, best appreciated within the left mid abdomen suspicious for development of peritoneal carcinomatosis. No free intraperitoneal gas. Vascular/Lymphatic: Aortic atherosclerosis. No enlarged abdominal or pelvic lymph  nodes. Reproductive: Status post hysterectomy. No adnexal masses. Other: No abdominal wall hernia Musculoskeletal: Widespread sclerotic metastases are seen throughout the visualized axial skeleton of the  abdomen and pelvis. There are remote appearing superior endplate fractures of L1, with mild loss of height, and L2 in keeping with pathologic fracture. No retropulsion or listhesis. Review of the MIP images confirms the above findings. IMPRESSION: 1. No pulmonary embolism. 2. Moderate multi-vessel coronary artery calcification. 3. Large bilateral pleural effusions with associated compressive atelectasis of the lung bases bilaterally. Superimposed interlobular septal thickening in keeping with some degree of interstitial pulmonary edema. 4. Asymmetric multifocal airspace infiltrate within the upper lobes bilaterally favored to represent inflammatory infiltrate in the setting of acute, atypical infection including viral pneumonia. 5. Marked thickening of the peribronchovascular interstitium and narrowing of the central airways which may reflect interstitial edema or inflammatory infiltrate in the setting of atypical infection and/or airway inflammation. Given the inflammatory findings within the upper lobes, this could represent a combination of the 2. 6. Widespread sclerotic metastases throughout the visualized axial skeleton of the abdomen and pelvis. Superimposed pathologic fractures of T5, T8-T11, and L1-L2. No retropulsion or listhesis. 7. Interval development of mild ascites and subtle nodularity of the omental fat suspicious for development of peritoneal carcinomatosis. Diagnostic paracentesis may be helpful for confirmation. 8. Interval placement of an ostomy appliance within the left upper quadrant likely at the site of a jejunostomy tract. A percutaneous feeding catheter, however, is not identified at this time. Aortic Atherosclerosis (ICD10-I70.0). Electronically Signed   By: Fidela Salisbury M.D.   On:  09/09/2021 04:15   CT ABDOMEN PELVIS W CONTRAST  Result Date: 09/24/2021 CLINICAL DATA:  Pulmonary embolism (PE) suspected, high prob; Abdominal pain, acute, nonlocalized. Nausea, vomiting. Gastric cancer peer EXAM: CT ANGIOGRAPHY CHEST CT ABDOMEN AND PELVIS WITH CONTRAST TECHNIQUE: Multidetector CT imaging of the chest was performed using the standard protocol during bolus administration of intravenous contrast. Multiplanar CT image reconstructions and MIPs were obtained to evaluate the vascular anatomy. Multidetector CT imaging of the abdomen and pelvis was performed using the standard protocol during bolus administration of intravenous contrast. RADIATION DOSE REDUCTION: This exam was performed according to the departmental dose-optimization program which includes automated exposure control, adjustment of the mA and/or kV according to patient size and/or use of iterative reconstruction technique. CONTRAST:  36m OMNIPAQUE IOHEXOL 350 MG/ML SOLN COMPARISON:  CT chest 03/16/2020, CT abdomen pelvis 11/10/2018 FINDINGS: CTA CHEST FINDINGS Cardiovascular: There is adequate opacification of the pulmonary arterial tree. No intraluminal filling defect identified to suggest acute pulmonary embolism. The central pulmonary arteries are of normal caliber. Moderate multi-vessel coronary artery calcification. Calcification of the aortic valve leaflets noted. Global cardiac size within normal limits. No pericardial effusion. Mild atherosclerotic calcification within the thoracic aorta. No aortic aneurysm. Left internal jugular chest port tip is seen at the superior cavoatrial junction. Mediastinum/Nodes: The thyroid gland is atrophic or absent. No pathologic thoracic adenopathy. There is soft tissue infiltration within the subcarinal region which may reflect inflammatory changes surrounding the mid esophagus. This is not well assessed on this examination due to the early phase of contrast enhancement. Lungs/Pleura: Large  bilateral pleural effusions are present with compressive atelectasis of the lung bases bilaterally. Interlobular septal thickening is noted within the lung bases bilaterally in keeping with least some degree of interstitial pulmonary edema. There is marked thickening of the peribronchovascular interstitium and narrowing of the central airways which may reflect interstitial edema or inflammatory infiltrate in the setting of atypical infection and/or airway inflammation. There is asymmetric multifocal airspace infiltrate noted within the upper lobes bilaterally which are favored to represent inflammatory infiltrate in the setting  of acute infection. No pneumothorax. Musculoskeletal: Widespread sclerotic metastases have developed within the visualized axial skeleton. Superimposed compression deformity of T5 with mild loss of height as well as superior endplate fractures of L4-T62 are now identified likely representing pathologic fracture. No retropulsion or listhesis. Review of the MIP images confirms the above findings. CT ABDOMEN and PELVIS FINDINGS Hepatobiliary: Simple cyst within the left hepatic lobe. No enhancing intrahepatic mass. Gallbladder is unremarkable. No intra or extrahepatic biliary ductal dilation. Pancreas: Atrophic but unremarkable Spleen: Status post splenectomy. Tiny splenule noted within the left upper quadrant. Adrenals/Urinary Tract: The adrenal glands are unremarkable. The kidneys are normal in size and position. Mild bilateral renal cortical scarring. Interval development of a wedge-shaped cortical defect within the lower pole of the right kidney demonstrating a small amount of associated cortical atrophy suspicious for a subacute to remote cortical infarct. No enhancing intrarenal mass. No perinephric fluid collections or inflammatory stranding. No intrarenal or ureteral calculi. No hydronephrosis. The bladder is unremarkable. Stomach/Bowel: There is interval placement of an ostomy appliance  within the left upper quadrant likely at the site of a jejunostomy tract. A percutaneous feeding catheter, however, is not identified at this time. The stomach, small bowel, and large bowel are otherwise unremarkable. The appendix is not clearly identified and is likely absent. Mild ascites has developed. Additionally, there is subtle nodularity of the omental fat, best appreciated within the left mid abdomen suspicious for development of peritoneal carcinomatosis. No free intraperitoneal gas. Vascular/Lymphatic: Aortic atherosclerosis. No enlarged abdominal or pelvic lymph nodes. Reproductive: Status post hysterectomy. No adnexal masses. Other: No abdominal wall hernia Musculoskeletal: Widespread sclerotic metastases are seen throughout the visualized axial skeleton of the abdomen and pelvis. There are remote appearing superior endplate fractures of L1, with mild loss of height, and L2 in keeping with pathologic fracture. No retropulsion or listhesis. Review of the MIP images confirms the above findings. IMPRESSION: 1. No pulmonary embolism. 2. Moderate multi-vessel coronary artery calcification. 3. Large bilateral pleural effusions with associated compressive atelectasis of the lung bases bilaterally. Superimposed interlobular septal thickening in keeping with some degree of interstitial pulmonary edema. 4. Asymmetric multifocal airspace infiltrate within the upper lobes bilaterally favored to represent inflammatory infiltrate in the setting of acute, atypical infection including viral pneumonia. 5. Marked thickening of the peribronchovascular interstitium and narrowing of the central airways which may reflect interstitial edema or inflammatory infiltrate in the setting of atypical infection and/or airway inflammation. Given the inflammatory findings within the upper lobes, this could represent a combination of the 2. 6. Widespread sclerotic metastases throughout the visualized axial skeleton of the abdomen and  pelvis. Superimposed pathologic fractures of T5, T8-T11, and L1-L2. No retropulsion or listhesis. 7. Interval development of mild ascites and subtle nodularity of the omental fat suspicious for development of peritoneal carcinomatosis. Diagnostic paracentesis may be helpful for confirmation. 8. Interval placement of an ostomy appliance within the left upper quadrant likely at the site of a jejunostomy tract. A percutaneous feeding catheter, however, is not identified at this time. Aortic Atherosclerosis (ICD10-I70.0). Electronically Signed   By: Fidela Salisbury M.D.   On: 09/15/2021 04:15   DG Chest 2 View  Result Date: 10/05/2021 CLINICAL DATA:  Encounter for shortness of breath, nausea and vomiting. Hypoxic upon presentation to EMS. EXAM: CHEST - 2 VIEW COMPARISON:  Portable chest and CTA chest both 03/16/2020. FINDINGS: Interval new left chest port device with IJ approach catheter terminating at about the superior cavoatrial junction. There is a wire or a  thin catheter traveling diagonally over the field from left-to-right. The heart has enlarged compared to the prior studies. The aorta is tortuous with patchy calcifications. There is perihilar vascular congestion and moderate interstitial edema. Ground-glass opacities are most likely due to ground-glass edema and there are moderate sized pleural effusions with overlying atelectasis or consolidation in the bases. There is osteopenia and thoracic spondylosis. Left upper quadrant surgical clips are again noted. IMPRESSION: 1. Cardiomegaly with moderate pulmonary edema consistent with CHF or fluid overload. 2. Moderate pleural effusions with overlying atelectasis or consolidation. 3. Perihilar ground-glass opacities most likely from ground-glass edema, less likely pneumonitis. Electronically Signed   By: Telford Nab M.D.   On: 10/04/2021 02:47    Procedures .Critical Care  Performed by: Jeanell Sparrow, DO Authorized by: Jeanell Sparrow, DO   Critical care  provider statement:    Critical care time (minutes):  30   Critical care time was exclusive of:  Separately billable procedures and treating other patients   Critical care was necessary to treat or prevent imminent or life-threatening deterioration of the following conditions:  Cardiac failure and respiratory failure   Critical care was time spent personally by me on the following activities:  Development of treatment plan with patient or surrogate, discussions with consultants, evaluation of patient's response to treatment, examination of patient, ordering and review of laboratory studies, ordering and review of radiographic studies, ordering and performing treatments and interventions, pulse oximetry, re-evaluation of patient's condition, review of old charts and obtaining history from patient or surrogate   Care discussed with: admitting provider       Medications Ordered in ED Medications  albuterol (PROVENTIL) (2.5 MG/3ML) 0.083% nebulizer solution 2.5 mg (has no administration in time range)  pantoprazole (PROTONIX) injection 40 mg (40 mg Intravenous Given 09/08/2021 0809)  LORazepam (ATIVAN) injection 1 mg (has no administration in time range)  morphine (PF) 2 MG/ML injection 1 mg (has no administration in time range)  acetaminophen (TYLENOL) tablet 650 mg (has no administration in time range)    Or  acetaminophen (TYLENOL) suppository 650 mg (has no administration in time range)  polyethylene glycol (MIRALAX / GLYCOLAX) packet 17 g (has no administration in time range)  ondansetron (ZOFRAN) tablet 4 mg (has no administration in time range)    Or  ondansetron (ZOFRAN) injection 4 mg (has no administration in time range)  prochlorperazine (COMPAZINE) injection 10 mg (has no administration in time range)  ipratropium-albuterol (DUONEB) 0.5-2.5 (3) MG/3ML nebulizer solution 3 mL (3 mLs Nebulization Given 09/13/2021 0236)  HYDROmorphone (DILAUDID) injection 1 mg (1 mg Intravenous Given 09/29/2021  0340)  ondansetron (ZOFRAN) injection 4 mg (4 mg Intravenous Given 09/19/2021 0340)  sodium chloride 0.9 % bolus 500 mL (0 mLs Intravenous Stopped 10/04/2021 0413)  iohexol (OMNIPAQUE) 350 MG/ML injection 80 mL (80 mLs Intravenous Contrast Given 10/02/2021 0320)  HYDROmorphone (DILAUDID) injection 1 mg (1 mg Intravenous Given 09/24/2021 0412)  HYDROmorphone (DILAUDID) injection 1 mg (1 mg Intravenous Given 09/28/2021 0603)  ondansetron (ZOFRAN) injection 4 mg (4 mg Intravenous Given 10/04/2021 0603)  furosemide (LASIX) injection 60 mg (60 mg Intravenous Given 10/05/2021 0603)    ED Course/ Medical Decision Making/ A&P                           Medical Decision Making Amount and/or Complexity of Data Reviewed Labs: ordered. Radiology: ordered. ECG/medicine tests: ordered.  Risk Prescription drug management. Decision regarding hospitalization.   This  patient presents to the ED with chief complaint(s) of dyspnea, worsening pain, poor p.o. with pertinent past medical history of metastatic disease, hospice which further complicates the presenting complaint. The complaint involves an extensive differential diagnosis and also carries with it a high risk of complications and morbidity.    In my evaluation of this patient's dyspnea my DDx includes, but is not limited to, pneumonia, pulmonary embolism, pneumothorax, pulmonary edema, metabolic acidosis, asthma, COPD, cardiac cause, anemia, anxiety, etc.   Differential diagnosis includes but is not exclusive to acute cholecystitis, intrathoracic causes for epigastric abdominal pain, gastritis, duodenitis, pancreatitis, small bowel or large bowel obstruction, abdominal aortic aneurysm, hernia, gastritis, etc.  Serious etiologies were considered.   The initial plan is to screening labs ordered in triage, give analgesics.  Well's score is at least moderate.  Will obtain CTPE imaging  Additional history obtained: Additional history obtained from family and spouse Records  reviewed previous admission documents, Care Everywhere/External Records, and home meds, prior labs  Independent labs interpretation:  The following labs were independently interpreted:  Troponin is 1781 delta >2000, rpt ecg w/o acute MI Hgb is 8.6, previously was 8.0-9 during recent admission at [facility] WBC 19, previously 10.2 on 9/1. Left shift noted Alk phos is 636, AST 50, ALT 32. Tbili is 0.4   Independent visualization of imaging: - I independently visualized the following imaging with scope of interpretation limited to determining acute life threatening conditions related to emergency care: CXR, CTPE, CTAP, which revealed CXR with pulm edema, CHF, CTPE multifocal infiltrate, pulmonary edema, No PE, diffuse sclerotic metastatic disease,  CTAP diffuse ascites. Metastatic disease  Cardiac monitoring was reviewed and interpreted by myself which shows sinus tachycardia   Treatment and Reassessment: Dilaudid  Zofran IVF 500 mL NS  Duoneb >> symptoms minimally improved   Consultation: - Consulted or discussed management/test interpretation w/ external professional: consult to palliative placed, I did speak with [facility] hospice nurse  Consideration for admission or further workup: Admission was considered    66 yo female to ED with DIB, severe pain. Hx metastatic disease diffuse on hospice care at home, DNR/DNI. Hypoxic in the ED, continues to require 2-3 LPM [place]. Unable to wean off. She has pulmonary edema on imaging, coarse breath sounds/rales on exam, orthopnea. Possible underlying pneumonia, malignant ascites, very high troponin and WBC, BNP also >1600. Discussed findings at length with patient and her family at bedside including her sister who is an oncology nurse. Pt does not desire BIPAP or antibiotics, does not want heparin anticoagulation. She is amenable to diuresis and is requesting a foley catheter. She affirms her DNR/DNI status. They would like to explore inpatient hospice, I  feel this is reasonable and I would anticipate patient would meet criteria for this if there is availability.  Will continue iv analgesics and start diuresis. Would likely benefit from PCA pump given her high level of pain requiring multiple doses of dilaudid.  Will admit to hospitalist service; family would like inpatient hospice.   Social Determinants of health: Social History   Tobacco Use   Smoking status: Former    Packs/day: 1.00    Years: 20.00    Total pack years: 20.00    Types: Cigarettes    Quit date: 08/2000    Years since quitting: 21.1   Smokeless tobacco: Never  Vaping Use   Vaping Use: Never used  Substance Use Topics   Alcohol use: No   Drug use: No  Final Clinical Impression(s) / ED Diagnoses Final diagnoses:  Hospice care  Acute respiratory failure with hypoxia ([place])  Acute pulmonary edema (HCC)  Metastatic malignant neoplasm, unspecified site ([place])  Intractable pain  Elevated troponin    Rx / DC Orders ED Discharge Orders     None         Jeanell Sparrow, DO 09/19/2021 [phone]

## 2021-09-12 NOTE — Assessment & Plan Note (Addendum)
Patient off levothyroxine due to poor prognosis.

## 2021-09-12 NOTE — Assessment & Plan Note (Signed)
   Developed as a complication of prior jejunostomy tube placement  S/P jejunostomy tube removal in early 2023  Regular ostomy bag exchanges as needed

## 2021-09-12 NOTE — Hospital Course (Signed)
Jeanne Sims was admitted to the hospital with the working diagnosis of intractable pain in the setting of metastatic esophageal cancer.   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 History of ovarian and cervical cancer. Hodgkin's lymphoma. 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  Patient had a recent hospitalization at [facility] from 8/25 to 09.01 for severe abdominal pain related to advance malignancy. She was discharged home with hospice services.   At home patient with dyspnea, nausea, vomiting and severe abdominal/chest pain, EMS was called and patient was transported to the hospital.  On her initial physical examination her blood pressure was 150/82, HR 123, RR 22 and 02 saturation 94% on room air. Patient was lethargic but easy to arouse, dry mucous membranes, heart with S1 and S2 present and tachycardic, lungs with decreased breath sounds and rales at bases, abdomen diffusely tender, with no distention, ostomy bag in place. Trace lower extremity edema.    NA 131, K 4,3 CL 98, bicarbonate 21, glucose 347, bun 17 cr 0,83 AlK P 636  AST 50 ALT 32 BNP 1,625 High sensitive troponin 1,781, and 2,181  Wbc 19,1 hgb 8,6 plt 116  Sars covid 19 negative Urine analysis SG 1,030, protein 30, 0-5 wbc   Chest radiograph with mild cardiomegaly, bilateral interstitial infiltrates more at the lower lobes, cephalization of the vasculature and bilateral pleural effusion.  CT chest with bilateral ground glass opacities, and bilateral pleural effusions, right upper lobe infiltrate.  No pulmonary embolism. Wide spread sclerotic metastasis throughout the visualized axial skeleton of the abdomen, and pelvis. Mild ascites. Peritoneal carcinomatosis.  Pathologic fractures T5, T8-T11 and L1 and L2.   EKG 126 bpm, normal axis, normal  intervals, sinus rhythm with no significant ST segment or T wave changes.   Patient with poor prognosis, due to advance malignancy. She was placed on comfort measures for symptomatic medical therapy.  Palliative care has been consulted. Patient with intermittent agitation.  09/10 continue comfort measures.  09/11 patient has been transitioned to continuous infusion of hydromorphone, her mentation not optimal for PCA pump.

## 2021-09-12 NOTE — Assessment & Plan Note (Signed)
   Likely malignant  EDP gave a dose of Lasix which seems to not be having much effect, as expected  Comfort measures as above

## 2021-09-12 NOTE — Assessment & Plan Note (Addendum)
   Gastric/GE Junction adenocarcinoma   Evidence of extensive osseus spread, malignant effusions and peritoneal carcinomatosis  Prognosis extremely poor  Comfort measures in accordance with patient/family wishes, as noted above.  Pain is better controlled with hydromorphone PCA pump with maintenance 0.5 mg per hr and bolus protocol 1 mg.  Continue as needed benzodiazepines per comfort measures protocol Patient very weak and deconditioned.  Follow up with palliative care recommendations

## 2021-09-12 NOTE — Progress Notes (Addendum)
Progress Note   Patient: Jeanne Sims ZOX:[id number] DOB: [birth year] DOA: 09/17/2021     0 DOS: the patient was seen and examined on 09/25/2021   Brief hospital course: Jeanne Sims was admitted to the hospital with the working diagnosis of intractable pain in the setting of metastatic esophageal cancer.   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 History of ovarian and cervical cancer. Hodgkin's lymphoma. 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  Patient had a recent hospitalization at [facility] from 8/25 to 09.01 for severe abdominal pain related to advance malignancy. She was discharged home with hospice services.   At home patient with dyspnea, nausea, vomiting and severe abdominal/chest pain, EMS was called and patient was transported to the hospital.  On her initial physical examination her blood pressure was 150/82, HR 123, RR 22 and 02 saturation 94% on room air. Patient was lethargic but easy to arouse, dry mucous membranes, heart with S1 and S2 present and tachycardic, lungs with decreased breath sounds and rales at bases, abdomen diffusely tender, with no distention, ostomy bag in place. Trace lower extremity edema.    NA 131, K 4,3 CL 98, bicarbonate 21, glucose 347, bun 17 cr 0,83 AlK P 636  AST 50 ALT 32 BNP 1,625 High sensitive troponin 1,781, and 2,181  Wbc 19,1 hgb 8,6 plt 116  Sars covid 19 negative Urine analysis SG 1,030, protein 30, 0-5 wbc   Chest radiograph with mild cardiomegaly, bilateral interstitial infiltrates more at the lower lobes, cephalization of the vasculature and bilateral pleural effusion.  CT chest with bilateral ground glass opacities, and bilateral pleural effusions, right upper lobe infiltrate.  No pulmonary embolism. Wide spread sclerotic metastasis throughout the visualized  axial skeleton of the abdomen, and pelvis. Mild ascites. Peritoneal carcinomatosis.  Pathologic fractures T5, T8-T11 and L1 and L2.   EKG 126 bpm, normal axis, normal intervals, sinus rhythm with no significant ST segment or T wave changes.   Patient with poor prognosis, due to advance malignancy. She was placed on comfort measures for symptomatic medical therapy.   Assessment and Plan: * Widespread metastatic malignant neoplastic disease ([place]) Gastric/GE Junction adenocarcinoma  Evidence of extensive osseus spread, malignant effusions and peritoneal carcinomatosis Prognosis extremely poor Comfort measures in accordance with patient/family wishes, as noted above.  Patient continue to have significant pain despite IV morphine and bolus of IV hydromorphone  Plan place patient on hydromorphone PCA pump (patient was on a pump in [facility] hospital) Continue with fentanyl patch.  Continue as needed benzodiazepines per comfort measures protocol Patient is DNR and plan to resume hospice services at home.  Consulted palliative care  Acute respiratory failure with hypoxia (HCC) Hypoxic on arrival Likely due to progressive malignant effusions EDP ordered lasix with limited to no response Very low rate O2 for now which would should be weaned off today comfort measures as above  Intractable pain Patient suffering from severe diffuse abdominal pain with intractable nausea and vomiting as a result of advanced progressing metastatic disease Prognosis extremely poor Patient/Family unable to achieve symptomatic relief at home.  Requesting hospitalization to further pursue comfort based measures Daughter and husband are at the bedside, are engaged and are all in agreement with the patient's desire to focus on comfort.  Providing patient with opiate based analgesics, anxiolytics, antiemetics and bronchodilator therapy for significant symptoms Palliative care consultation placed  Acute diastolic heart  failure ([place]) Patient with pulmonary edema, bilateral pleural effusions, cardiogenic. Complicated with acute respiratory failure.  Plan to continue diuresis for symptomatic control Will increase furosemide to 60 mg IV q12 hrs Follow up electrolytes.  Echocardiogram from 2021 with preserved LV systolic function Considering patient's prognosis will hold on further cardiac workup.   NSTEMI (non-ST elevated myocardial infarction) [facility] Patient with very poor prognosis.  Peritoneal carcinomatosis and risk of bleeding is high Will hold on heparin and continue symptomatic control including diuresis with IV furosemide Patient with end stage cancer with metastasis. Poor prognosis, not candidate for further cardiac workup.    Essential hypertension Continue blood pressure monitoring.  Continue diuresis with furosemide.   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  Patient with very poor oral intake.         Subjective: Patient continue to have abdominal pain, not improved with IV morphine, at home with nausea and vomiting, not tolerating po well.   Physical Exam: Vitals:   09/20/2021 1245 09/30/2021 1300 09/13/2021 1400 10/03/2021 1445  BP: 126/73 125/74 131/78   Pulse: (!) 120 (!) 115 (!) 120 (!) 118  Resp: 18 15 (!) 22 17  Temp:    98 F (36.7 C)  TempSrc:      SpO2: 98% 97% 95% 98%   Neurology awake and alert ENT with pallor no icterus Cardiovascular with S1 and S2 present and rhythmic with no gallops No rubs or  murmurs Respiratory with bilateral rales but not wheezing Abdomen with distention and tender to palpation, there is ostomy bag in place at the site of the bowel fistula Positive lower extremity edema ++  Data Reviewed:    Family Communication: I spoke with patient's husband and sister at the bedside, we talked in detail about patient's condition, plan of care and prognosis and all questions were addressed.   Disposition: Status is: Observation The patient will require care spanning > 2 midnights and should be moved to inpatient because: IV pain control   Planned Discharge Destination: Home with hospice      Author: Tawni Millers, MD 10/05/2021 2:50 PM  For on call review [email or web address].

## 2021-09-12 NOTE — ED Triage Notes (Addendum)
Pt brought toED by [facility] with c/o shortness of  breath an nausea/vomiting x1 day. Pt has hx of stomach cancer and is hospice. Has recently had increase in dilaudid for pain medication. Endorses decrease appetite and fluid intake.Pt does not wear supplemental oxygen at home.    EMS Interventions 18g IV LFA 500cc NS bolus Zofran '4mg'$    EMS Vitals BP 150/70 HR 125 RR 20 SO2 98% 3L O2 CBG 465

## 2021-09-12 NOTE — Consult Note (Signed)
Consultation Note Date: 09/27/2021   Patient Name: Jeanne Sims  DOB: 1955/02/08  MRN: [id number]  Age / Sex: 66 y.o., female  PCP: Bonnita Nasuti, MD Referring Physician: Tawni Millers  Reason for Consultation: "intractable pain due to progressive malignancy," "advanced metastatic disease," "end of life, s/s rapidly worsening, inpatient hospice?"  HPI/Patient Profile: 66 y.o. 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  Patient also has a history of ovarian cancer and cervical cancer (S/P TAH and BSO 1974) as well as Hodgkin's lymphoma status post chemo and mantle radiation in 1987, moderate aortic stenosis (Echo 07/2021), history of jejunostomy tube placement for severe dysphagia complicated by development of small bowel fistula status post removal of the jejunostomy tube in early 2023, hypothyroidism, non-insulin-dependent diabetes mellitus type 2, bilateral PEs 02/2019 (no longer on anticoagulation), stroke 02/2019, gastroesophageal reflux disease, hypertension, hyperlipidemia presented to ED on 09/13/2021 from home with severe intractable pain and shortness of breath. Patient is enrolled in hospice care, symptoms were not able to be managed well at home. Patient was admitted on 09/07/2021 for comfort care.     Clinical Assessment and Goals of Care: I have reviewed medical records including EPIC notes, labs, and imaging. Received report from primary RN - no acute concerns.   Went to visit patient at bedside - husband/[name] and daughter/[name] present.Patient was lying in bed asleep - she briefly arouses to voice/gentle touch but quickly falls back asleep. She remains asleep for duration of visit. No signs or non-verbal gestures of pain or discomfort noted. No respiratory distress, increased work  of breathing, or secretions noted.   Met with patient's family  to discuss diagnosis, prognosis, GOC, EOL wishes, disposition, and options.  I introduced Palliative Medicine as specialized medical care for people living with serious illness. It focuses on providing relief from the symptoms and stress of a serious illness. The goal is to improve quality of life for both the patient and the family.  We discussed a brief life review of the patient as well as functional and nutritional status. Patient is married and [name] is patient's only child. Therapeutic listening provided as they reflect over the last several years - patient received most recent cancer diagnosis May 2022. She underwent chemotherapy until May of 2023; physicians told her at that time they would not continue to offer her treatment. This past Saturday, 09/07/21 patient transitioned from outpatient Palliative Care to hospice care with [facility].  Family confirm goal is comfort care. Patient only presented to ED because of her unmanaged symptoms at home. We discussed symptom management since her admission - family explain oral morphine was not helpful. They endorse improvement of her pain with initiation of dilaudid PCA and improvement with shortness of breath with lasix. Education provided on comfort medications and symptom management plan. During visit, Co2 monitor connected to PCA continually beeped - discontinued - education provided to family why this intervention was not needed. They were grateful to have it discontinued.  Discussed discharge option of residential hospice, explaining patient's symptoms would likely be managed best there. Family are not interested in residential hospice transfer; their primary goal is for patient to return home if symptoms can be managed. If not, they would want her to remain in house for EOL care. Reviewed possible options for home symptom management to include advanced home infusion; however,  unsure if [facility] would offer this. Will need  to discuss with one of their liaisons or providers. Family would be open to this if offered. [name] reflects on conversation with patient upon their arrival to ED - patient was ok if she died in the hospital; this conversation brings family peace. Explained that ongoing assessments and recommendations would be made pending her stability for transfer. Family understand patient may become too unstable for transfer; in this instance, they would be agreeable for her to remain in house.   Other family members are coming from [place] to see patient tonight. Current [place] EOL visitation reviewed - family expressed understanding.   Visit also consisted of discussions dealing with the complex and emotionally intense issues of symptom management and palliative care in the setting of serious and life-threatening illness.   Discussed with family the importance of continued conversation with each other and the medical providers regarding overall plan of care and treatment options, ensuring decisions are within the context of the patient's values and GOCs.    Questions and concerns were addressed. The patient/family was encouraged to call with questions and/or concerns. PMT card was provided.   Primary Decision Maker: NEXT OF KIN - husband/[name] Groman    SUMMARY OF RECOMMENDATIONS   Continue full comfort measures Continue DNR/DNI as previously documented Family are not interested in residential hospice transfer. They are hopeful patient can return home with symptoms managed; family also understand she may be hospital death. Will need to see if [facility] can provide advanced home infusions prior to discharge home Added orders for EOL symptom management and to reflect full comfort measures, as well as discontinued orders that were not focused on comfort Unrestricted visitation orders were placed per current [place] EOL visitation policy   Nursing to provide frequent assessments and administer PRN medications as clinically necessary to ensure EOL comfort PMT will continue to follow and support holistically  Symptom Management Continue PCA at this time - may consider transitioning to dilaudid drip with PRN bolus doses if patient too lethargic to bolus herself  Continue fentanyl patch Continue lasix  Tylenol PRN pain/fever Biotin twice daily Benadryl PRN itching Robinul PRN secretions Haldol PRN agitation/delirium Ativan PRN anxiety/seizure/sleep/distress Continue scheduled zofran with PRN doses for breakthrough symptoms Continue compazine PRN refractory nausea Liquifilm Tears PRN dry eye   Code Status/Advance Care Planning: DNR  Palliative Prophylaxis:  Aspiration, Bowel Regimen, Delirium Protocol, Eye Care, Frequent Pain Assessment, Oral Care, and Turn Reposition  Additional Recommendations (Limitations, Scope, Preferences): Full Comfort Care  Psycho-social/Spiritual:  Desire for further Chaplaincy support:no Created space and opportunity for patient and family to express thoughts and feelings regarding patient's current medical situation.  Emotional support and therapeutic listening provided.  Prognosis:  < 2 weeks would not be surprising   Discharge Planning: To Be Determined      Primary Diagnoses: Present on Admission:  NSTEMI (non-ST elevated myocardial infarction) ([place])  Hypothyroidism  Essential hypertension  Widespread metastatic malignant neoplastic disease ([place])  Small bowel fistula  Acute diastolic heart failure ([place])   I have reviewed the medical record, interviewed the patient and family, and examined the patient. The following aspects are pertinent.  Past Medical History:  Diagnosis Date   Acquired deafness    Cervical cancer ([place]) 1075   Cervical cancer (HCC)    CVA (cerebral vascular accident) ([place]) 02/2019   Diabetes mellitus without complication (HCC)    Dyspareunia    GERD  (gastroesophageal reflux disease)    Heart murmur    Hodgkin disease ([place])  1987   Ovarian cancer [facility])    age 70   Psoriasis    Thyroid disease    Social History   Socioeconomic History   Marital status: Married    Spouse name: Not on file   Number of children: 1   Years of education: Not on file   Highest education level: Not on file  Occupational History   Not on file  Tobacco Use   Smoking status: Former    Packs/day: 1.00    Years: 20.00    Total pack years: 20.00    Types: Cigarettes    Quit date: 08/2000    Years since quitting: 21.1   Smokeless tobacco: Never  Vaping Use   Vaping Use: Never used  Substance and Sexual Activity   Alcohol use: No   Drug use: No   Sexual activity: Never    Comment: TAH/BSO  Other Topics Concern   Not on file  Social History Narrative   Not on file   Social Determinants of Health   Financial Resource Strain: Not on file  Food Insecurity: Not on file  Transportation Needs: Not on file  Physical Activity: Not on file  Stress: Not on file  Social Connections: Not on file   Family History  Problem Relation Age of Onset   Diabetes Mother    Heart disease Mother    Stroke Mother    Diabetes Brother    Heart attack Brother    Diabetes Sister    Cervical cancer Sister    Diabetes Sister    Cervical cancer Sister    Diabetes Sister    Diabetes Brother    Breast cancer Paternal Uncle    Breast cancer Maternal Aunt    Scheduled Meds:  fentaNYL  1 patch Transdermal Q3 days   furosemide  60 mg Intravenous Q12H   HYDROmorphone   Intravenous Q4H   pantoprazole (PROTONIX) IV  40 mg Intravenous Q24H   Continuous Infusions: PRN Meds:.acetaminophen **OR** acetaminophen, albuterol, diphenhydrAMINE **OR** diphenhydrAMINE, LORazepam, naloxone **AND** sodium chloride flush, ondansetron **OR** ondansetron (ZOFRAN) IV, ondansetron (ZOFRAN) IV, polyethylene glycol, prochlorperazine Medications Prior to Admission:  Prior to Admission  medications   Medication Sig Start Date End Date Taking? Authorizing Provider  acetaminophen (TYLENOL) 160 MG/5ML liquid Take 640 mg by mouth as needed for pain.   Yes [provider]  ciprofloxacin (CILOXAN) 0.3 % ophthalmic solution Place 2 drops into the left ear as needed (ear ache). 08/22/21  Yes [provider]  cyclobenzaprine (FLEXERIL) 5 MG tablet Take 5 mg by mouth at bedtime. 07/27/21  Yes [provider]  diazepam (VALIUM) 2 MG tablet Take 2 mg by mouth as needed for anxiety. 03/08/20  Yes [provider]  fentaNYL (DURAGESIC) 50 MCG/HR Place 1 patch onto the skin every 3 (three) days. 09/06/21  Yes [provider]  fluticasone (FLONASE) 50 MCG/ACT nasal spray Place 2 sprays into both nostrils as needed for allergies.   Yes [provider]  HYDROmorphone (DILAUDID) 2 MG tablet Take 6 mg by mouth every 3 (three) hours. 09/10/21  Yes [provider]  levothyroxine (SYNTHROID) 150 MCG tablet Take 150 mcg by mouth daily before breakfast.   Yes [provider]  megestrol (MEGACE) 40 MG/ML suspension Take 400 mg by mouth daily. 06/12/21  Yes [provider]  methylphenidate (RITALIN) 10 MG tablet Take 10 mg by mouth daily. 07/24/21  Yes [provider]  naloxone (NARCAN) nasal spray 4 mg/0.1 mL Place 0.4  mg into the nose once. 06/12/21  Yes [provider]  nystatin (MYCOSTATIN/NYSTOP) powder Apply 1 Application topically as needed (rash).   Yes [provider]  OLANZapine (ZYPREXA) 2.5 MG tablet Take 2.5 mg by mouth at bedtime. 09/06/21  Yes [provider]  ondansetron (ZOFRAN-ODT) 4 MG disintegrating tablet Take 4 mg by mouth 3 (three) times daily. 09/06/21  Yes [provider]  prochlorperazine (COMPAZINE) 10 MG tablet Take 10 mg by mouth as needed. 09/11/21  Yes [provider]  clopidogrel (PLAVIX) 75 MG tablet Take 1 tablet (75 mg total) by mouth daily. Patient not  taking: Reported on 09/26/2021 08/18/19   Garvin Fila, MD  dexamethasone (DECADRON) 0.1 % ophthalmic solution  08/22/21   [provider]  dexamethasone (DECADRON) 4 MG tablet Take by mouth. Patient not taking: Reported on 09/15/2021 09/10/21   [provider]  gabapentin (NEURONTIN) 250 MG/5ML solution Take by mouth. Patient not taking: Reported on 10/03/2021 09/03/21   [provider]  hyoscyamine (LEVSIN SL) 0.125 MG SL tablet Place under the tongue. Patient not taking: Reported on 09/28/2021 09/11/21   [provider]  LORazepam (ATIVAN) 0.5 MG tablet Take by mouth. Patient not taking: Reported on 10/05/2021 09/11/21   [provider]  losartan (COZAAR) 25 MG tablet Take 25 mg by mouth daily. Patient not taking: Reported on 09/06/2021 07/29/21   [provider]  meloxicam (MOBIC) 7.5 MG tablet Take 7.5 mg by mouth 2 (two) times daily. Patient not taking: Reported on 09/27/2021 09/08/21   [provider]  metoprolol succinate (TOPROL-XL) 25 MG 24 hr tablet Take 25 mg by mouth daily. Patient not taking: Reported on 09/29/2021 12/24/19   [provider]  rosuvastatin (CRESTOR) 20 MG tablet Take 1 tablet (20 mg total) by mouth daily. Patient not taking: Reported on 09/25/2021 02/24/19   Jonetta Osgood, MD   Allergies  Allergen Reactions   Azithromycin Other (See Comments)    Not effective   Depakote [Divalproex Sodium] Other (See Comments)    Unknown    Topamax [Topiramate] Other (See Comments)    Unknown    Vancomycin Other (See Comments)    Unknown   Review of Systems  Unable to perform ROS: Other    Physical Exam Vitals and nursing note reviewed.  Constitutional:      General: She is not in acute distress.    Appearance: She is ill-appearing.  Pulmonary:     Effort: No respiratory distress.  Skin:    General: Skin is warm and dry.  Neurological:     Mental Status: She is lethargic.     Motor: Weakness present.   Psychiatric:        Speech: She is noncommunicative.     Vital Signs: BP 119/79   Pulse (!) 121   Temp 98 F (36.7 C)   Resp 13   SpO2 97%  Pain Scale: 0-10 POSS *See Group Information*: 1-Acceptable,Awake and alert Pain Score: 7    SpO2: SpO2: 97 % O2 Device:SpO2: 97 % O2 Flow Rate: .O2 Flow Rate (L/min): 4 L/min  IO: Intake/output summary: No intake or output data in the 24 hours ending 09/19/2021 1713  LBM:   Baseline Weight:   Most recent weight:       Palliative Assessment/Data: PPS 10%     Time In: 1730 Time Out: 1900 Time Total: 90 minutes  Greater than 50%  of this time was spent counseling and coordinating care related to  the above assessment and plan.  Signed by: Lin Landsman, NP   Please contact Palliative Medicine Team phone at [phone] for questions and concerns.  For individual provider: See Amion  *Portions of this note are a verbal dictation therefore any spelling and/or grammatical errors are due to the "[place] [employer]" system interpretation.

## 2021-09-12 NOTE — Assessment & Plan Note (Signed)
   Patient suffering from severe diffuse abdominal pain with intractable nausea and vomiting as a result of advanced progressing metastatic disease  Prognosis extremely poor  Patient/Family unable to achieve symptomatic relief at home.  Requesting hospitalization to further pursue comfort based measures  Daughter and husband are at the bedside, are engaged and are all in agreement with the patient's desire to focus on comfort.   Providing patient with opiate based analgesics, anxiolytics, antiemetics and bronchodilator therapy for significant symptoms  Palliative care consultation placed

## 2021-09-12 NOTE — ED Notes (Signed)
Palliative at bedside

## 2021-09-12 NOTE — ED Notes (Signed)
Attempted to transition pt to RA, O2 sats decreased to 75-83%, pt placed on 2L [place] O2 sats 93%; EDP aware

## 2021-09-12 NOTE — Assessment & Plan Note (Addendum)
   Hypoxic on arrival  Likely due to progressive malignant effusions  EDP ordered lasix with limited to no response  Very low rate O2 for now which would should be weaned off today  comfort measures as above

## 2021-09-12 NOTE — Assessment & Plan Note (Addendum)
Patient with hyperglycemia on admission No further insulin therapy, patient on comfort measures.

## 2021-09-12 NOTE — Assessment & Plan Note (Addendum)
Patient with very poor prognosis.  Peritoneal carcinomatosis and risk of bleeding is high Continue to hold on heparin and continue symptomatic control including diuresis with IV furosemide Patient with end stage cancer with metastasis. Poor prognosis, not candidate for further cardiac workup.

## 2021-09-12 NOTE — Progress Notes (Signed)
Transition of Care [facility]) - Emergency Department Mini Assessment   Patient Details  Name: Jeanne Sims MRN: [id number] Date of Birth: 1955/09/30  Transition of Care [facility]) CM/SW Contact:    Fuller Mandril, RN Phone Number: 09/14/2021, 3:18 PM   Clinical Narrative: RNCM contacted by Caron Presume [phone]) with [facility] notifying us that pt is active with them for home hospice services.  RNCM following for disposition needs. Ozetta Flatley J. Clydene Laming, RN, BSN, [place] [phone]    ED Mini Assessment: What brought you to the Emergency Department? : (P) pain and difficulty breathing  Barriers to Discharge: (P) Continued Medical Work up             Patient Contact and Communications        ,          Patient states their goals for this hospitalization and ongoing recovery are:: (P) get my pain under control      Admission diagnosis:  Intractable pain [R52] Patient Active Problem List   Diagnosis Date Noted   Intractable pain 09/29/2021   Gastro-esophageal reflux disease without esophagitis 09/21/2021   Type 2 diabetes mellitus without complications ([place]) 75/91/6384   Pleural effusion, bilateral 10/05/2021   Acute respiratory failure with hypoxia ([place]) 09/19/2021   NSTEMI (non-ST elevated myocardial infarction) ([place]) 09/08/2021   Widespread metastatic malignant neoplastic disease ([place]) 09/10/2021   Small bowel fistula 66/59/9357   Acute diastolic heart failure ([place]) 09/28/2021   Malignant ascites 08/31/2021   Essential hypertension 06/17/2020   Megaloblastic anemia due to vitamin B12 deficiency 06/17/2020   C. difficile diarrhea 02/27/2019   CVA (cerebrovascular accident) ([place]) 02/27/2019   Cerebrovascular accident (CVA) ([place])    HLD (hyperlipidemia) 02/21/2019   Controlled type 2 diabetes mellitus with hyperglycemia ([place]) 02/21/2019   Hypothyroidism 02/21/2019   Acute pulmonary embolism ([place]) 02/21/2019   COVID-19 virus infection 02/21/2019   PE  (physical exam), annual 02/20/2019   Acute pulmonary embolism without acute cor pulmonale ([place]) 02/20/2019   PCP:  Bonnita Nasuti, MD Pharmacy:   [place] - [place] [address] [place] [address] Phone: [phone] Fax: [phone]  [id number] [place] [address]Phone: [phone]: [phone]

## 2021-09-12 NOTE — ED Provider Triage Note (Signed)
Emergency Medicine Provider Triage Evaluation Note  Jeanne Sims , a 66 y.o. female  was evaluated in triage.  Pt complains of sob. Report having sob with n/v for 2 days.  Also endorse some abdominal discomfort which improves after having a BM today.  EMS noted pt was hypoxic.  Pt is with hospice care, but reportedly full code.  Denies fever, chills, dysuria  Review of Systems  Positive: As above Negative: As above  Physical Exam  BP 136/75 (BP Location: Right Arm)   Pulse (!) 124   Temp 97.9 F (36.6 C) (Oral)   Resp 20   SpO2 98%  Gen:   Awake, no distress   Resp:  Normal effort  MSK:   Moves extremities without difficulty  Other:    Medical Decision Making  Medically screening exam initiated at 12:27 AM.  Appropriate orders placed.  Jeanne Sims was informed that the remainder of the evaluation will be completed by another provider, this initial triage assessment does not replace that evaluation, and the importance of remaining in the ED until their evaluation is complete.     Domenic Moras, PA-C 10/02/2021 [phone]

## 2021-09-13 DIAGNOSIS — I10 Essential (primary) hypertension: Secondary | ICD-10-CM | POA: Diagnosis not present

## 2021-09-13 DIAGNOSIS — C800 Disseminated malignant neoplasm, unspecified: Secondary | ICD-10-CM | POA: Diagnosis not present

## 2021-09-13 DIAGNOSIS — Z515 Encounter for palliative care: Principal | ICD-10-CM

## 2021-09-13 DIAGNOSIS — R52 Pain, unspecified: Secondary | ICD-10-CM | POA: Diagnosis not present

## 2021-09-13 DIAGNOSIS — J9601 Acute respiratory failure with hypoxia: Secondary | ICD-10-CM | POA: Diagnosis not present

## 2021-09-13 DIAGNOSIS — I214 Non-ST elevation (NSTEMI) myocardial infarction: Secondary | ICD-10-CM | POA: Diagnosis not present

## 2021-09-13 DIAGNOSIS — I5031 Acute diastolic (congestive) heart failure: Secondary | ICD-10-CM | POA: Diagnosis not present

## 2021-09-13 MED ORDER — MEGESTROL ACETATE 400 MG/10ML PO SUSP
400.0000 mg | Freq: Every day | ORAL | Status: DC
  Filled 2021-09-13 (×2): qty 10

## 2021-09-13 MED ORDER — HYDROMORPHONE 1 MG/ML IV SOLN
INTRAVENOUS | Status: DC
  Administered 2021-09-14: 3.47 mg via INTRAVENOUS
  Administered 2021-09-14: 2.52 mg via INTRAVENOUS
  Administered 2021-09-14: 3.61 mg via INTRAVENOUS
  Administered 2021-09-14: 2.74 mg via INTRAVENOUS
  Administered 2021-09-14: 2.86 mg via INTRAVENOUS
  Filled 2021-09-13: qty 30

## 2021-09-13 NOTE — Progress Notes (Signed)
Palliative Medicine Progress Note   Patient Name: Jeanne Sims       Date: 09/13/2021 DOB: [birth year]  Age: 66 y.o. MRN#: [id number] Attending Physician: Tawni Millers Primary Care Physician: Bonnita Nasuti, MD Admit Date: 10/01/2021  Reason for Consultation/Follow-up: {Reason for Consult:23484}  HPI/Patient Profile: 66 y.o. 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  Patient also has a history of ovarian cancer and cervical cancer (S/P TAH and BSO 1974) as well as Hodgkin's lymphoma status post chemo and mantle radiation in 1987, moderate aortic stenosis (Echo 07/2021), history of jejunostomy tube placement for severe dysphagia complicated by development of small bowel fistula status post removal of the jejunostomy tube in early 2023, hypothyroidism, non-insulin-dependent diabetes mellitus type 2, bilateral PEs 02/2019 (no longer on anticoagulation), stroke 02/2019, gastroesophageal reflux disease, hypertension, hyperlipidemia presented to ED on 09/19/2021 from home with severe intractable pain and shortness of breath. Patient is enrolled in hospice care, symptoms were not able to be managed well at home. Patient was admitted on 10/05/2021 for comfort care.   Subjective: ***  Objective:  Physical Exam          Vital Signs: BP 110/66 (BP Location: Left Arm)   Pulse (!) 125   Temp 97.7 F (36.5 C) (Oral)   Resp 16   SpO2 98%  SpO2: SpO2: 98 % O2 Device: O2 Device: Nasal Cannula O2 Flow Rate: O2 Flow Rate (L/min): 2 L/min  Intake/output summary: No intake or output data in the 24 hours ending 09/13/21 1055  LBM:       Palliative Assessment/Data:  ***     Palliative Medicine Assessment & Plan   Assessment: Principal Problem:   Widespread metastatic malignant neoplastic disease ([place]) Active Problems:   Hypothyroidism   Essential hypertension   Type 2 diabetes mellitus without complications ([place])   NSTEMI (non-ST elevated myocardial infarction) ([place])   Small bowel fistula   Acute diastolic heart failure (HCC)    Recommendations/Plan: ***  Goals of Care and Additional Recommendations: Limitations on Scope of Treatment: {Recommended Scope and Preferences:21019}  Code Status:   Prognosis:  {Palliative Care Prognosis:23504}  Discharge Planning: {Palliative dispostion:23505}  Care plan was discussed with ***  Thank you for allowing the Palliative  Medicine Team to assist in the care of this patient.   ***   Lavena Bullion, NP   Please contact Palliative Medicine Team phone at [phone] for questions and concerns.  For individual providers, please see AMION.

## 2021-09-13 NOTE — Progress Notes (Signed)
Progress Note   Patient: Jeanne Sims PZW:[id number] DOB: Oct 13, 1955 DOA: 09/11/2021     1 DOS: the patient was seen and examined on 09/13/2021   Brief hospital course: Mrs. Huettner was admitted to the hospital with the working diagnosis of intractable pain in the setting of metastatic esophageal cancer.   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 History of ovarian and cervical cancer. Hodgkin's lymphoma. 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  Patient had a recent hospitalization at [facility] from 8/25 to 09.01 for severe abdominal pain related to advance malignancy. She was discharged home with hospice services.   At home patient with dyspnea, nausea, vomiting and severe abdominal/chest pain, EMS was called and patient was transported to the hospital.  On her initial physical examination her blood pressure was 150/82, HR 123, RR 22 and 02 saturation 94% on room air. Patient was lethargic but easy to arouse, dry mucous membranes, heart with S1 and S2 present and tachycardic, lungs with decreased breath sounds and rales at bases, abdomen diffusely tender, with no distention, ostomy bag in place. Trace lower extremity edema.    NA 131, K 4,3 CL 98, bicarbonate 21, glucose 347, bun 17 cr 0,83 AlK P 636  AST 50 ALT 32 BNP 1,625 High sensitive troponin 1,781, and 2,181  Wbc 19,1 hgb 8,6 plt 116  Sars covid 19 negative Urine analysis SG 1,030, protein 30, 0-5 wbc   Chest radiograph with mild cardiomegaly, bilateral interstitial infiltrates more at the lower lobes, cephalization of the vasculature and bilateral pleural effusion.  CT chest with bilateral ground glass opacities, and bilateral pleural effusions, right upper lobe infiltrate.  No pulmonary embolism. Wide spread sclerotic metastasis throughout the visualized  axial skeleton of the abdomen, and pelvis. Mild ascites. Peritoneal carcinomatosis.  Pathologic fractures T5, T8-T11 and L1 and L2.   EKG 126 bpm, normal axis, normal intervals, sinus rhythm with no significant ST segment or T wave changes.   Patient with poor prognosis, due to advance malignancy. She was placed on comfort measures for symptomatic medical therapy.   Assessment and Plan: * Widespread metastatic malignant neoplastic disease ([place]) Gastric/GE Junction adenocarcinoma  Evidence of extensive osseus spread, malignant effusions and peritoneal carcinomatosis Prognosis extremely poor Comfort measures in accordance with patient/family wishes, as noted above.  Pain is better controlled with hydromorphone PCA pump with maintenance 0.5 mg per hr and bolus protocol 1 mg.  Continue as needed benzodiazepines per comfort measures protocol Patient very weak and deconditioned.  Follow up with palliative care recommendations   Acute respiratory failure with hypoxia (HCC) Hypoxic on arrival Likely due to progressive malignant effusions EDP ordered lasix with limited to no response Very low rate O2 for now which would should be weaned off today comfort measures as above  Intractable pain Patient suffering from severe diffuse abdominal pain with intractable nausea and vomiting as a result of advanced progressing metastatic disease Prognosis extremely poor Patient/Family unable to achieve symptomatic relief at home.  Requesting hospitalization to further pursue comfort based measures Daughter and husband are at the bedside, are engaged and are all in agreement with the patient's desire to focus on comfort.  Providing patient with opiate based analgesics, anxiolytics, antiemetics and bronchodilator therapy for significant symptoms Palliative care consultation placed  Acute diastolic heart failure ([place]) Patient with pulmonary edema, bilateral pleural effusions, cardiogenic. Complicated with  acute respiratory failure.  Plan to continue diuresis for symptomatic control Continue with furosemide to 60 mg IV q12 hrs  Echocardiogram from 2021 with preserved LV systolic function Considering patient's prognosis will hold on further cardiac workup.   NSTEMI (non-ST elevated myocardial infarction) [facility]) Patient with very poor prognosis.  Peritoneal carcinomatosis and risk of bleeding is high Continue to hold on heparin and continue symptomatic control including diuresis with IV furosemide Patient with end stage cancer with metastasis. Poor prognosis, not candidate for further cardiac workup.    Essential hypertension Continue blood pressure monitoring.  Continue diuresis with furosemide.   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    Type 2 diabetes mellitus without complications ([place]) Patient with hyperglycemia on admission No further insulin therapy, patient on comfort measures.         Subjective: Patient with better pain controlled with hydromorphone pump. At the time of my examination patient sleeping and her family is at the bedside.   Physical Exam: Vitals:   09/13/21 0605 09/13/21 0730 09/13/21 1200 09/13/21 1542  BP: (!) 110/59 110/66    Pulse: (!) 121 (!) 125    Resp:  16 14 17   Temp: (!) 97.5 F (36.4 C) 97.7 F (36.5 C)    TempSrc: Oral Oral    SpO2: 98% 98% 97% 98%   Patient sleeping, ill looking appearing and very deconditioned.  No signs of respiratory distress.   Data Reviewed:    Family Communication: I spoke with patient's husband at the bedside, we talked in detail about patient's condition, plan  of care and prognosis and all questions were addressed.   Disposition: Status is: Inpatient Remains inpatient appropriate because: intractable pain requiring IV analgesics.   Planned Discharge Destination:  to be determined, imminent death       Author: Tawni Millers, MD 09/13/2021 4:19 PM  For on call review [email or web address].

## 2021-09-13 NOTE — Progress Notes (Addendum)
Cardiac monitoring continued per family request. Orders modified by Placido Sou. Palliative NP to reflect comfort measures. Relayed call parameters to tele tech at this time. No call required for changes in arrhythmias or heart rate.

## 2021-09-14 DIAGNOSIS — C800 Disseminated malignant neoplasm, unspecified: Secondary | ICD-10-CM | POA: Diagnosis not present

## 2021-09-14 DIAGNOSIS — I5031 Acute diastolic (congestive) heart failure: Secondary | ICD-10-CM | POA: Diagnosis not present

## 2021-09-14 DIAGNOSIS — Z515 Encounter for palliative care: Secondary | ICD-10-CM | POA: Diagnosis not present

## 2021-09-14 DIAGNOSIS — I214 Non-ST elevation (NSTEMI) myocardial infarction: Secondary | ICD-10-CM | POA: Diagnosis not present

## 2021-09-14 DIAGNOSIS — R52 Pain, unspecified: Secondary | ICD-10-CM | POA: Diagnosis not present

## 2021-09-14 DIAGNOSIS — I10 Essential (primary) hypertension: Secondary | ICD-10-CM | POA: Diagnosis not present

## 2021-09-14 DIAGNOSIS — J9601 Acute respiratory failure with hypoxia: Secondary | ICD-10-CM | POA: Diagnosis not present

## 2021-09-14 MED ORDER — SODIUM CHLORIDE 0.9% FLUSH
10.0000 mL | Freq: Two times a day (BID) | INTRAVENOUS | Status: DC
  Administered 2021-09-14 – 2021-09-17 (×5): 10 mL

## 2021-09-14 MED ORDER — HYDROMORPHONE HCL 1 MG/ML IJ SOLN
1.0000 mg | INTRAMUSCULAR | Status: AC
  Administered 2021-09-14: 1 mg via INTRAVENOUS
  Filled 2021-09-14: qty 1

## 2021-09-14 MED ORDER — CHLORHEXIDINE GLUCONATE CLOTH 2 % EX PADS
6.0000 | MEDICATED_PAD | Freq: Every day | CUTANEOUS | Status: DC
  Administered 2021-09-14: 6 via TOPICAL

## 2021-09-14 MED ORDER — SODIUM CHLORIDE 0.9% FLUSH: 10.0000 mL | INTRAVENOUS | Status: DC | PRN

## 2021-09-14 NOTE — Progress Notes (Addendum)
HPOA copy brought  in by patient's daughter, placed in patient's chart

## 2021-09-14 NOTE — Progress Notes (Signed)
Patient's daughter called and requested that patient do not received any PRN medications. Meanwhile patient's husband is at the bedside requesting medication to calm patient down .

## 2021-09-14 NOTE — Progress Notes (Signed)
Palliative Medicine Progress Note   Patient Name: Jeanne Sims       Date: 09/14/2021 DOB: July 22, 1955  Age: 66 y.o. MRN#: [id number] Attending Physician: Tawni Millers Primary Care Physician: Bonnita Nasuti, MD Admit Date: 09/25/2021  Reason for Consultation/Follow-up: end of life care, symptom management  HPI/Patient Profile: 66 y.o. 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  Patient also has a history of ovarian cancer and cervical cancer (S/P TAH and BSO 1974) as well as Hodgkin's lymphoma status post chemo and mantle radiation in 1987, moderate aortic stenosis (Echo 07/2021), history of jejunostomy tube placement for severe dysphagia complicated by development of small bowel fistula status post removal of the jejunostomy tube in early 2023, hypothyroidism, non-insulin-dependent diabetes mellitus type 2, bilateral PEs 02/2019 (no longer on anticoagulation), stroke 02/2019, gastroesophageal reflux disease, hypertension, hyperlipidemia presented to ED on 10/05/2021 from home with severe intractable pain and shortness of breath. Patient is enrolled in hospice care, symptoms were not able to be managed well at home. Patient was admitted on 09/21/2021 for comfort care.     Subjective: Chart reviewed and patient assessed at bedside. [name] (who is HCPOA) is present. Patient appears to be resting comfortably. She is much more somnolent compared to yesterday. She is not responsive to voice during my visit. Jeanne Sims reports she will intermittently awaken briefly and has been able to push her PCA button a few times today.   Jeanne Sims expresses frustration that patient  received prn ativan during the night without RN speaking with her first. Apparently she had become quite restless/agitated. Per MAR review, patient received 1 mg at [place] and 2115 (evening of 9/8) and again at 0301 this morning. Jeanne Sims feels the ativan has caused over-sedation and she was hopeful her mother could have additional time to be interactive with family. Jeanne Sims understands need for symptom management and does not want ativan discontinued, but wants to be notified before it is given. Emotional support provided.  In the setting of patient's change in mental status, daughter feels it is most appropriate for patient to remain in the hospital for end of life care.    Objective:  Physical Exam Vitals reviewed.  Constitutional:      General: She  is not in acute distress.    Appearance: She is ill-appearing.  Pulmonary:     Effort: Pulmonary effort is normal.  Neurological:     Mental Status: She is lethargic.     Comments: Not responsive to voice             Vital Signs: BP 110/66 (BP Location: Left Arm)   Pulse (!) 125   Temp 97.7 F (36.5 C) (Oral)   Resp 18   SpO2 (!) 85%  SpO2: SpO2: (!) 85 % O2 Device: O2 Device: Nasal Cannula O2 Flow Rate: O2 Flow Rate (L/min): 4 L/min     Palliative Assessment/Data: PPS 10%     Palliative Medicine Assessment & Plan   Assessment: Principal Problem:   Widespread metastatic malignant neoplastic disease (HCC) Active Problems:   Hypothyroidism   Essential hypertension   Type 2 diabetes mellitus without complications (HCC)   NSTEMI (non-ST elevated myocardial infarction) (HCC)   Small bowel fistula   Acute diastolic heart failure (HCC)    Recommendations/Plan: DNR/DNI as previously documented Continue full comfort measures Continue cardiac monitoring per family request  Continue PCA Dilaudid as currently ordered Nursing to please notify daughter prior to administering ativan Daughter wishes patient to remain in the  hospital for end of life Ongoing palliative support   Prognosis:  Likely days  Discharge Planning: Anticipated Hospital Death    Thank you for allowing the Palliative Medicine Team to assist in the care of this patient.   MDM - High   Lavena Bullion, NP   Please contact Palliative Medicine Team phone at [phone] for questions and concerns.  For individual providers, please see AMION.

## 2021-09-14 NOTE — Progress Notes (Signed)
Patient daughter came to the floor saying she is the healthcare power of attorney and insisting patient does not receive any PRN medications. I informed her I do not see anywhere in the computer that she is listed as HPOA.. She is listed as emergency contact. I informed her to bring paper works be placed on file as HPOA.

## 2021-09-14 NOTE — Progress Notes (Signed)
Progress Note   Patient: Jeanne Sims HFW:[id number] DOB: 1955/06/19 DOA: 09/10/2021     2 DOS: the patient was seen and examined on 09/14/2021   Brief hospital course: Mrs. Lawniczak was admitted to the hospital with the working diagnosis of intractable pain in the setting of metastatic esophageal cancer.   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 History of ovarian and cervical cancer. Hodgkin's lymphoma. 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  Patient had a recent hospitalization at [facility] from 8/25 to 09.01 for severe abdominal pain related to advance malignancy. She was discharged home with hospice services.   At home patient with dyspnea, nausea, vomiting and severe abdominal/chest pain, EMS was called and patient was transported to the hospital.  On her initial physical examination her blood pressure was 150/82, HR 123, RR 22 and 02 saturation 94% on room air. Patient was lethargic but easy to arouse, dry mucous membranes, heart with S1 and S2 present and tachycardic, lungs with decreased breath sounds and rales at bases, abdomen diffusely tender, with no distention, ostomy bag in place. Trace lower extremity edema.    NA 131, K 4,3 CL 98, bicarbonate 21, glucose 347, bun 17 cr 0,83 AlK P 636  AST 50 ALT 32 BNP 1,625 High sensitive troponin 1,781, and 2,181  Wbc 19,1 hgb 8,6 plt 116  Sars covid 19 negative Urine analysis SG 1,030, protein 30, 0-5 wbc   Chest radiograph with mild cardiomegaly, bilateral interstitial infiltrates more at the lower lobes, cephalization of the vasculature and bilateral pleural effusion.  CT chest with bilateral ground glass opacities, and bilateral pleural effusions, right upper lobe infiltrate.  No pulmonary embolism. Wide spread sclerotic metastasis throughout the visualized  axial skeleton of the abdomen, and pelvis. Mild ascites. Peritoneal carcinomatosis.  Pathologic fractures T5, T8-T11 and L1 and L2.   EKG 126 bpm, normal axis, normal intervals, sinus rhythm with no significant ST segment or T wave changes.   Patient with poor prognosis, due to advance malignancy. She was placed on comfort measures for symptomatic medical therapy.  Palliative care has been consulted. Patient with intermittent agitation.   Assessment and Plan: * Widespread metastatic malignant neoplastic disease ([place]) Gastric/GE Junction adenocarcinoma  Evidence of extensive osseus spread, malignant effusions and peritoneal carcinomatosis Prognosis extremely poor Comfort measures in accordance with patient/family wishes, as noted above.  Continue with hydromorphone PCA pump with maintenance 0.5 mg per hr and bolus protocol 1 mg.  Patient with agitation, she has received lorazepam with apparent worsening agitation and confusion. Acute metabolic encephalopathy and delirium.   Will use haloperidol as needed for agitation and use lorazepam only if haloperidol not effective. If continue uncontrolled agitation will consider adding olanzapine IM.  Continue close monitor inpatient for palliative care and advance comfort measures interventions.   Acute respiratory failure with hypoxia (HCC) Hypoxic on arrival Likely due to progressive malignant effusions EDP ordered lasix with limited to no response Very low rate O2 for now which would should be weaned off today comfort measures as above  Intractable pain Patient suffering from severe diffuse abdominal pain with intractable nausea and vomiting as a result of advanced progressing metastatic disease Prognosis extremely poor Patient/Family unable to achieve symptomatic relief at home.  Requesting hospitalization to further pursue comfort based measures Daughter and husband are at the bedside, are engaged and are all in agreement  with the  patient's desire to focus on comfort.  Providing patient with opiate based analgesics, anxiolytics, antiemetics and bronchodilator therapy for significant symptoms Palliative care consultation placed  Acute diastolic heart failure ([place]) Patient with pulmonary edema, bilateral pleural effusions, cardiogenic. Complicated with acute respiratory failure.  Plan to continue diuresis for symptomatic control Continue with furosemide to 60 mg IV q12 hrs  Echocardiogram from 2021 with preserved LV systolic function Considering patient's prognosis will hold on further cardiac workup.   NSTEMI (non-ST elevated myocardial infarction) [facility]) Patient with very poor prognosis.  Peritoneal carcinomatosis and risk of bleeding is high Continue to hold on heparin and continue symptomatic control including diuresis with IV furosemide Patient with end stage cancer with metastasis. Poor prognosis, not candidate for further cardiac workup.    Essential hypertension Continue blood pressure monitoring.  Continue diuresis with furosemide.   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    Type 2 diabetes mellitus without complications ([place]) Patient with hyperglycemia on admission No further insulin therapy, patient on comfort measures.         Subjective: Patient is awake and agitated, mumbling words, not making eye contact or following commands. Her daughter is at the bedside   Physical Exam: Vitals:   09/14/21 0040 09/14/21 0308 09/14/21 0803 09/14/21 1203  BP:      Pulse:      Resp: 20 (!) 22 20 16   Temp:      TempSrc:      SpO2: 92% (!) 80% (!) 83%     Neurology awake, agitated and not interactive,  Deconditioned and ill looking appearing ENT with pallor Cardiovascular with S1 and S2 present  Respiratory with scattered bilateral rhonchi Abdomen with mild distention  Trace lower extremity edema Feet with cyanosis at the toes.  Data Reviewed:    Family Communication: I spoke with patient's daughter at the bedside, we talked in detail about patient's condition, plan of care and prognosis and all questions were addressed.   Disposition: Status is: Inpatient Remains inpatient appropriate because: imminent death, advance palliative care interventions. Imminent death.    Planned Discharge Destination:  to be determined     Author: Tawni Millers, MD 09/14/2021 2:33 PM  For on call review [email or web address].

## 2021-09-15 DIAGNOSIS — I10 Essential (primary) hypertension: Secondary | ICD-10-CM | POA: Diagnosis not present

## 2021-09-15 DIAGNOSIS — I214 Non-ST elevation (NSTEMI) myocardial infarction: Secondary | ICD-10-CM | POA: Diagnosis not present

## 2021-09-15 DIAGNOSIS — Z7189 Other specified counseling: Secondary | ICD-10-CM | POA: Diagnosis not present

## 2021-09-15 DIAGNOSIS — C800 Disseminated malignant neoplasm, unspecified: Secondary | ICD-10-CM | POA: Diagnosis not present

## 2021-09-15 DIAGNOSIS — Z515 Encounter for palliative care: Principal | ICD-10-CM

## 2021-09-15 DIAGNOSIS — I5031 Acute diastolic (congestive) heart failure: Secondary | ICD-10-CM | POA: Diagnosis not present

## 2021-09-15 MED ORDER — SODIUM CHLORIDE 0.9 % IV SOLN
1.0000 mg/h | INTRAVENOUS | Status: DC
  Administered 2021-09-15 – 2021-09-16 (×2): 0.5 mg/h via INTRAVENOUS
  Filled 2021-09-15 (×2): qty 5

## 2021-09-15 MED ORDER — SODIUM CHLORIDE 0.9 % IV SOLN: 0.5000 mg/h | INTRAVENOUS | Status: DC

## 2021-09-15 MED ORDER — HYDROMORPHONE BOLUS VIA INFUSION
1.0000 mg | INTRAVENOUS | Status: DC | PRN
  Administered 2021-09-15 – 2021-09-18 (×17): 1 mg via INTRAVENOUS

## 2021-09-15 MED ORDER — HYDROMORPHONE HCL 1 MG/ML IJ SOLN
0.5000 mg | INTRAMUSCULAR | Status: DC | PRN
  Administered 2021-09-15 (×5): 1 mg via INTRAVENOUS
  Filled 2021-09-15 (×3): qty 1

## 2021-09-15 NOTE — Progress Notes (Signed)
Family at bed side, very involved in care, were educated on pt's transitioning, PRN Ativan, haldol, new fentanyl patch applied on right arm and cont dilaudid per md order. Pt was repositioned several times, Pt was agitated w/ eyes closed and was non verbal pt  was trying to get out of bed; was grabbing bed rails, was pulling off gown, and cords, mittens were suggested, family upset denied and verbalized that it was not needed because she would have hated having those and they thought it was not a good idea because they would like her to be free,pt's granddaughter verbalized that there was no way pt could pull off CL port. I assume that in one of those moments where pt was trying to pull out cords her central line port came lose causing leakage. IV team consultation was placed. Family needs reinforcement on pt's care.

## 2021-09-15 NOTE — Plan of Care (Signed)
Pt is transitioning/ on comfort care

## 2021-09-15 NOTE — Progress Notes (Signed)
Received patient from night shift RN.  Patient resting comfortably, on her right side, breathing easy and unlabored.  Dilaudid drip infusing.  Daughter [name] at bedside.  No needs expressed at this time.

## 2021-09-15 NOTE — Progress Notes (Signed)
Progress Note   Patient: Jeanne [facility]s ZOX:[id number] DOB: 07/22/1955 DOA: 09/11/2021     3 DOS: the patient was seen and examined on 09/15/2021   Brief [facility] course: Mrs. [facility]ne [facility] was admitted to the [facility] with the working diagnosis of intractable pain in the setting of metastatic esophageal cancer.   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 History of ovarian and cervical cancer. Hodgkin's lymphoma. 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  Patient had a recent hospitalization [facility] from 8/25 to 09.01 for severe abdominal pain related to advance malignancy. She was discharged home with hospice services.   At home patient with dyspnea, nausea, vomiting and severe abdominal/chest pain, EMS was called and patient was transported to the [facility].  On her initial physical examination her blood pressure was 150/82, HR 123, RR 22 and 02 saturation 94% on room air. Patient was lethargic but easy to arouse, dry mucous membranes, heart with S1 and S2 present and tachycardic, lungs with decreased breath sounds and rales at bases, abdomen diffusely tender, with no distention, ostomy bag in place. Trace lower extremity edema.    NA 131, K 4,3 CL 98, bicarbonate 21, glucose 347, bun 17 cr 0,83 AlK P 636  AST 50 ALT 32 BNP 1,625 High sensitive troponin 1,781, and 2,181  Wbc 19,1 hgb 8,6 plt 116  Sars covid 19 negative Urine analysis SG 1,030, protein 30, 0-5 wbc   Chest radiograph with mild cardiomegaly, bilateral interstitial infiltrates more at the lower lobes, cephalization of the vasculature and bilateral pleural effusion.  CT chest with bilateral ground glass opacities, and bilateral pleural effusions, right upper lobe infiltrate.  No pulmonary embolism. Wide spread sclerotic metastasis throughout the visualized  axial skeleton of the abdomen, and pelvis. Mild ascites. Peritoneal carcinomatosis.  Pathologic fractures T5, T8-T11 and L1 and L2.   EKG 126 bpm, normal axis, normal intervals, sinus rhythm with no significant ST segment or T wave changes.   Patient with poor prognosis, due to advance malignancy. She was placed on comfort measures for symptomatic medical therapy.  Palliative care has been consulted. Patient with intermittent agitation.  09/10 continue comfort measures.   Assessment and Plan: * Widespread metastatic malignant neoplastic disease ([facility])  Gastric/GE Junction adenocarcinoma  Evidence of extensive osseus spread, malignant effusions and peritoneal carcinomatosis Prognosis extremely poor Comfort measures in accordance with patient/family wishes, as noted above.  Acute metabolic encephalopathy and delirium.  Patient has intermittent agitation, not able to self administer bolus medication.   Continue pain control with hydromorphone infusion 1 mg per hr As needed hydromorphone boluses, lorazepam and haloperidol.  Patient has fentanyl patch in place.   Acute respiratory failure with hypoxia (HCC) Hypoxic on arrival Likely due to progressive malignant effusions EDP ordered lasix with limited to no response Very low rate O2 for now which would should be weaned off today comfort measures as above  Intractable pain Patient suffering from severe diffuse abdominal pain with intractable nausea and vomiting as a result of advanced progressing metastatic disease Prognosis extremely poor Patient/Family unable to achieve symptomatic relief at home.  Requesting hospitalization to further pursue comfort based measures Daughter and husband are at the bedside, are engaged and are all in agreement with the patient's desire to focus on comfort.  Providing patient with opiate based analgesics, anxiolytics, antiemetics and bronchodilator therapy for significant symptoms Palliative care  consultation placed  Acute diastolic heart failure ([facility]) Patient with pulmonary edema, bilateral pleural effusions, cardiogenic. Complicated with acute respiratory failure.  Plan to continue diuresis for symptomatic control Continue with furosemide to 60 mg IV q12 hrs  Echocardiogram from 2021 with preserved LV systolic function Considering patient's prognosis will hold on further cardiac workup.   NSTEMI (non-ST elevated myocardial infarction) [facility]) Patient with very poor prognosis.  Peritoneal carcinomatosis and risk of bleeding is high Continue to hold on heparin and continue symptomatic control including diuresis with IV furosemide Patient with end stage cancer with metastasis. Poor prognosis, not candidate for further cardiac workup.    Essential hypertension Furosemide for palliative de-congestion.   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    Type 2 diabetes mellitus without complications ([facility]) Patient with hyperglycemia on admission No further insulin therapy, patient on comfort measures.         Subjective: Patient has been calm with intermittent agitation, her family is at the bedside   Physical Exam: Vitals:   09/14/21 0803 09/14/21 1203 09/14/21 1622 09/15/21 0515  BP:    (!) 130/54  Pulse:    (!) 134  Resp: 20 16 18 20   Temp:      TempSrc:      SpO2: (!) 83%  (!) 85% 91%   At the time of my evaluation patient being assisted per nursing  She is somnolent and hyporeactive Data Reviewed:    Family Communication: I spoke with patient's husband and sister  at the bedside, we talked in detail about patient's  condition, plan of care and prognosis and all questions were addressed.   Disposition: Status is: Inpatient Remains inpatient appropriate because: imminent death comfort measures   Planned Discharge Destination:  imminent death     Author: Tawni Millers, MD 09/15/2021 3:55 PM  For on call review [email or web address].

## 2021-09-15 NOTE — Progress Notes (Signed)
Patient restless and agitated at the beginning of my shift. One time dose of dilaudid '1mg'$  given at 67. Patient continued to appear uncomfortable. Haldol given at 2234. Ativan given at 2347. Patient unable to safely manage PCA at this time. Dr. Myna Hidalgo made aware. Patient switched to dilaudid continuous infusion with bolus's PRN. Daughter at bedside since beginning of shift and all decisions discussed with her before administering any medications.

## 2021-09-15 NOTE — Progress Notes (Signed)
Daily Progress Note   Patient Name: [facility]       Date: 09/15/2021 DOB: 06/25/1955  Age: 66 y.o. MRN#: [id number] Attending Physician: Tawni Millers Primary Care Physician: Bonnita Nasuti, MD Admit Date: 09/20/2021  Reason for Consultation/Follow-up: symptom management, end of life  Patient Profile/HPI:   66 y.o. 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  Patient also has a history of ovarian cancer and cervical cancer (S/P TAH and BSO 1974) as well as Hodgkin's lymphoma status post chemo and mantle radiation in 1987, moderate aortic stenosis ([place] 07/2021), history of jejunostomy tube placement for severe dysphagia complicated by development of small bowel fistula status post removal of the jejunostomy tube in early 2023, hypothyroidism, non-insulin-dependent diabetes mellitus type 2, bilateral PEs 02/2019 (no longer on anticoagulation), stroke 02/2019, gastroesophageal reflux disease, hypertension, hyperlipidemia presented to ED on 10/04/2021 from home with severe intractable pain and shortness of breath. Patient is enrolled in hospice care, symptoms were not able to be managed well at home. Patient was admitted on 09/17/2021 for comfort care.   Subjective: Chart reviewed including labs progress notes and medication use.  She has required 3 mg of prn IV Dilaudid over the last 24 hours she had 1 mg of Ativan this morning at 9:00 she required 2 mg of IV Haldol she also has a 50 mcg/h fentanyl patch in place.  She has hydromorphone infusion at 0.5 mg/h for 24-hour dose of 12 mg. Per nurse notes she has had some ongoing agitation and pain had episode last night and again this  morning. Her spouse is at the bedside-he notes that she appears calm now, however she does have periods of pain and agitation.  Discussed options for more aggressive symptom management with spouse and he deferred and requested that I call patient's daughter Jeanne Sims. I called [facility]ne Sims feels that her episodes of agitation were caused by her not having her cochlear implant in place.  She now has that in place.  I recommended scheduled '1mg'$  IV lorazepam every 6 hours in order to decrease her episodes of agitation.  [name] wishes to continue with as needed medication.  Emotional support provided, room given for [name] to express her emotions, concerns, and frustrations regarding  her mom's care, she was very complimentary of patient's overnight nurse [facility].  Review of Systems  Unable to perform ROS: Acuity of condition     Physical Exam Vitals and nursing note reviewed.  Pulmonary:     Effort: Pulmonary effort is normal.     Comments: Even and unlabored Neurological:     Comments: unresponsive             Vital Signs: BP (!) 130/54 (BP Location: Right Arm)   Pulse (!) 134   Temp 97.7 F (36.5 C) (Oral)   Resp 20   SpO2 91%  SpO2: SpO2: 91 % O2 Device: O2 Device: Nasal Cannula O2 Flow Rate: O2 Flow Rate (L/min): 4 L/min  Intake/output summary:  Intake/Output Summary (Last 24 hours) at 09/15/2021 1527 Last data filed at 09/15/2021 1020 Gross per 24 hour  Intake 9.07 ml  Output 350 ml  Net -340.93 ml   LBM:   Baseline Weight:   Most recent weight:         Palliative Assessment/Data: PPS: 10%      Patient Active Problem List   Diagnosis Date Noted   Intractable pain 09/26/2021   Gastro-esophageal reflux disease without esophagitis 09/14/2021   Type 2 diabetes mellitus without complications ([place]) 09/98/3382   Pleural effusion, bilateral 09/14/2021   Acute respiratory failure with hypoxia ([place]) 09/17/2021   NSTEMI (non-ST elevated myocardial infarction) ([place])  09/06/2021   Widespread metastatic malignant neoplastic disease ([place]) 09/27/2021   Small bowel fistula 50/53/9767   Acute diastolic heart failure ([place]) 10/03/2021   Malignant ascites 08/31/2021   Essential hypertension 06/17/2020   Megaloblastic anemia due to vitamin B12 deficiency 06/17/2020   C. difficile diarrhea 02/27/2019   CVA (cerebrovascular accident) ([place]) 02/27/2019   Cerebrovascular accident (CVA) ([place])    HLD (hyperlipidemia) 02/21/2019   Controlled type 2 diabetes mellitus with hyperglycemia ([place]) 02/21/2019   Hypothyroidism 02/21/2019   Acute pulmonary embolism ([place]) 02/21/2019   COVID-19 virus infection 02/21/2019   PE (physical exam), annual 02/20/2019   Acute pulmonary embolism without acute cor pulmonale ([place]) 02/20/2019    Palliative Care Assessment & Plan    Assessment/Recommendations/Plan  Patient is nearing end-of-life Continue current comfort interventions As needed hydromorphone changed to bolus dose from pump in efforts to assist with quicker administration- frequency also changed to every 15 minutes as needed If patient continues to have periods of agitation recommend lorazepam 1 mg every 6 hours scheduled in addition to lorazepam currently ordered as needed   Code Status: DNR  Prognosis:  Hours - Days  Discharge Planning: Anticipated Hospital Death  Care plan was discussed with patient's daughter  Thank you for allowing the Palliative Medicine Team to assist in the care of this patient.  Total time:  Prolonged billing:      Greater than 50%  of this time was spent counseling and coordinating care related to the above assessment and plan.  [facility], AGNP-C Palliative Medicine   Please contact Palliative Medicine Team phone at [phone] for questions and concerns.

## 2021-09-15 NOTE — Progress Notes (Signed)
Patient rested well from 0000 until 0500. Patient, then appeared in pain and agitated. Dilaudid '1mg'$  given. Patient unable to rest or calm down. Haldol '2mg'$  given with no success. RN and family at bedside attempting to comfort patient with back rubs and position changes at this time. Family is on board for more ativan, but not until patients spouse arrives this morning.

## 2021-09-16 DIAGNOSIS — I5031 Acute diastolic (congestive) heart failure: Secondary | ICD-10-CM | POA: Diagnosis not present

## 2021-09-16 DIAGNOSIS — C800 Disseminated malignant neoplasm, unspecified: Secondary | ICD-10-CM | POA: Diagnosis not present

## 2021-09-16 DIAGNOSIS — I10 Essential (primary) hypertension: Secondary | ICD-10-CM | POA: Diagnosis not present

## 2021-09-16 DIAGNOSIS — I214 Non-ST elevation (NSTEMI) myocardial infarction: Secondary | ICD-10-CM | POA: Diagnosis not present

## 2021-09-16 MED ORDER — FUROSEMIDE 10 MG/ML IJ SOLN: 60.0000 mg | Freq: Every day | INTRAMUSCULAR | Status: DC | PRN

## 2021-09-16 NOTE — Progress Notes (Addendum)
Progress Note   Patient: YENNY KOSA XIP:[id number] DOB: 07/31/1955 DOA: 10/02/2021     4 DOS: the patient was seen and examined on 09/16/2021   Brief hospital course: Mrs. Claassen was admitted to the hospital with the working diagnosis of intractable pain in the setting of metastatic esophageal cancer.   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 History of ovarian and cervical cancer. Hodgkin's lymphoma. 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  Patient had a recent hospitalization at [facility] from 8/25 to 09.01 for severe abdominal pain related to advance malignancy. She was discharged home with hospice services.   At home patient with dyspnea, nausea, vomiting and severe abdominal/chest pain, EMS was called and patient was transported to the hospital.  On her initial physical examination her blood pressure was 150/82, HR 123, RR 22 and 02 saturation 94% on room air. Patient was lethargic but easy to arouse, dry mucous membranes, heart with S1 and S2 present and tachycardic, lungs with decreased breath sounds and rales at bases, abdomen diffusely tender, with no distention, ostomy bag in place. Trace lower extremity edema.    NA 131, K 4,3 CL 98, bicarbonate 21, glucose 347, bun 17 cr 0,83 AlK P 636  AST 50 ALT 32 BNP 1,625 High sensitive troponin 1,781, and 2,181  Wbc 19,1 hgb 8,6 plt 116  Sars covid 19 negative Urine analysis SG 1,030, protein 30, 0-5 wbc   Chest radiograph with mild cardiomegaly, bilateral interstitial infiltrates more at the lower lobes, cephalization of the vasculature and bilateral pleural effusion.  CT chest with bilateral ground glass opacities, and bilateral pleural effusions, right upper lobe infiltrate.  No pulmonary embolism. Wide spread sclerotic metastasis throughout the visualized  axial skeleton of the abdomen, and pelvis. Mild ascites. Peritoneal carcinomatosis.  Pathologic fractures T5, T8-T11 and L1 and L2.   EKG 126 bpm, normal axis, normal intervals, sinus rhythm with no significant ST segment or T wave changes.   Patient with poor prognosis, due to advance malignancy. She was placed on comfort measures for symptomatic medical therapy.  Palliative care has been consulted. Patient with intermittent agitation.  09/10 continue comfort measures.  09/11 patient has been transitioned to continuous infusion of hydromorphone, her mentation not optimal for PCA pump.   Assessment and Plan: * Widespread metastatic malignant neoplastic disease ([place]) Gastric/GE Junction adenocarcinoma  Evidence of extensive osseus spread, malignant effusions and peritoneal carcinomatosis Prognosis extremely poor Comfort measures in accordance with patient/family wishes, as noted above.  Acute metabolic encephalopathy and delirium.  Agitation has improved.   Continue pain control with hydromorphone infusion 1 mg per hr As needed hydromorphone boluses, lorazepam and haloperidol.  Patient has fentanyl patch in place.  Her family is at the bedside.   Acute respiratory failure with hypoxia (HCC) Hypoxic on arrival Likely due to progressive malignant effusions EDP ordered lasix with limited to no response Very low rate O2 for now which would should be weaned off today comfort measures as above  Intractable pain Patient suffering from severe diffuse abdominal pain with intractable nausea and vomiting as a result of advanced progressing metastatic disease Prognosis extremely poor Patient/Family unable to achieve symptomatic relief at home.  Requesting hospitalization to further pursue comfort based measures Daughter and husband are at the bedside, are engaged and are all in agreement with the patient's desire to focus on comfort.  Providing patient with opiate based analgesics,  anxiolytics, antiemetics and bronchodilator therapy for significant symptoms Palliative care consultation placed  Acute diastolic heart failure ([place]) Patient with pulmonary edema, bilateral pleural effusions, cardiogenic. Complicated with acute respiratory failure.  Plan to continue diuresis for symptomatic control Continue with furosemide to 60 mg IV q12 hrs  Echocardiogram from 2021 with preserved LV systolic function Considering patient's prognosis will hold on further cardiac workup.   NSTEMI (non-ST elevated myocardial infarction) [facility]) Patient with very poor prognosis.  Peritoneal carcinomatosis and risk of bleeding is high Continue to hold on heparin  Change furosemide to as needed for respiratory distress.  Patient with end stage cancer with metastasis. Poor prognosis, not candidate for further cardiac workup.    Essential hypertension Furosemide for palliative de-congestion.   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    Type 2 diabetes mellitus without complications ([place]) Patient with hyperglycemia on admission No further insulin therapy, patient on comfort measures.         Subjective: Patient has been sedated, her family is at the bedside and no recent episodes of agitation   Physical Exam: Vitals:   09/14/21 0803 09/14/21 1203 09/14/21 1622 09/15/21 0515  BP:    (!) 130/54  Pulse:    (!) 134  Resp: 20 16 18 20   Temp:      TempSrc:      SpO2: (!) 83%  (!) 85% 91%   Patient is sedated, no signs of agitation or pain. Ill looking appearing and very deconditioned  Data Reviewed:    Family Communication: I spoke  with patient's sisters at the bedside, we talked in detail about patient's condition, plan of care and prognosis and all questions were addressed.   Disposition: Status is: Inpatient Remains inpatient appropriate because: IV analgesics, palliative care   Planned Discharge Destination:  imminent death      Author: Tawni Millers, MD 09/16/2021 1:45 PM  For on call review [email or web address].

## 2021-09-16 NOTE — Progress Notes (Signed)
Patient AJ:OINOMV L Prehn      DOB: 12-30-1955      EHM:[id number]      Palliative Medicine Team    Subjective: Bedside symptom check completed. Husband and one visitor bedside at time of visit.    Physical exam: Patient resting in bed with eyes closed at time of visit, laying on left side. Breathing even and non-labored with nasal cannula applied at 3L, no excessive secretions noted. Patient without physical or non-verbal signs of pain or discomfort at this time. Patient warm extremities with weakly palpable distal pulses, edema noted in left upper extremity. Significant mottling in feet noted this morning. Patient not interactive with this RN with verbal and tactile stimulation, did not try to elicit response further.    Assessment and plan: Visitors without new concerns or needs today, ask for towels for room, provided for their use. Bedside nurses without needs or concerns today, plan made for day. Ancipitate patient is progressing toward EOL, in house death anticipated, family does not want to move patient at this time. Patient appears comfortable with current interventions, made known to visitors that if any changes arise, please alert nursing and PMT happy to come bedside and assist as needed. Will continue to follow for any changes or advances.    Thank you for allowing the Palliative Medicine Team to assist in the care of this patient.     Damian Leavell, MSN, RN Palliative Medicine Team Team Phone: [phone]  This phone is monitored 7a-7p, please reach out to attending physician outside of these hours for urgent needs.

## 2021-09-16 NOTE — Care Management Important Message (Signed)
Important Message  Patient Details  Name: [facility]ne [facility] MRN: [id number] Date of Birth: 05/08/1955   Medicare Important Message Given:  Other (see comment)     Hannah Beat 09/16/2021, 12:00 PM

## 2021-09-16 NOTE — Progress Notes (Signed)
9/11 IM Letter respectfully not given, patient under Comfort Measures.

## 2021-09-17 DIAGNOSIS — J810 Acute pulmonary edema: Secondary | ICD-10-CM | POA: Diagnosis not present

## 2021-09-17 DIAGNOSIS — C800 Disseminated malignant neoplasm, unspecified: Secondary | ICD-10-CM | POA: Diagnosis not present

## 2021-09-17 DIAGNOSIS — I214 Non-ST elevation (NSTEMI) myocardial infarction: Secondary | ICD-10-CM | POA: Diagnosis not present

## 2021-09-17 DIAGNOSIS — Z515 Encounter for palliative care: Secondary | ICD-10-CM | POA: Diagnosis not present

## 2021-09-17 DIAGNOSIS — I10 Essential (primary) hypertension: Secondary | ICD-10-CM | POA: Diagnosis not present

## 2021-09-17 DIAGNOSIS — I5031 Acute diastolic (congestive) heart failure: Secondary | ICD-10-CM | POA: Diagnosis not present

## 2021-09-17 MED ORDER — LORAZEPAM 2 MG/ML IJ SOLN: 1.0000 mg | Freq: Three times a day (TID) | INTRAMUSCULAR | Status: DC

## 2021-09-17 MED ORDER — SCOPOLAMINE 1 MG/3DAYS TD PT72
1.0000 | MEDICATED_PATCH | TRANSDERMAL | Status: DC
  Administered 2021-09-17: 1.5 mg via TRANSDERMAL
  Filled 2021-09-17: qty 1

## 2021-09-17 MED ORDER — LORAZEPAM 2 MG/ML IJ SOLN
1.0000 mg | Freq: Three times a day (TID) | INTRAMUSCULAR | Status: DC
  Administered 2021-09-17 (×2): 1 mg via INTRAVENOUS
  Filled 2021-09-17 (×2): qty 1

## 2021-09-17 MED ORDER — LORAZEPAM 2 MG/ML IJ SOLN: 2.0000 mg | INTRAMUSCULAR | Status: DC | PRN

## 2021-09-17 MED ORDER — SCOPOLAMINE 1 MG/3DAYS TD PT72: 1.0000 | MEDICATED_PATCH | TRANSDERMAL | Status: DC

## 2021-09-17 NOTE — Progress Notes (Signed)
Daily Progress Note   Patient Name: Jeanne Sims       Date: 09/17/2021 DOB: 1955-04-06  Age: 67 y.o. MRN#: [id number] Attending Physician: Tawni Millers Primary Care Physician: Bonnita Nasuti, MD Admit Date: 09/25/2021  Reason for Consultation/Follow-up: symptom management, end of life  Patient Profile/HPI:   66 y.o. 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  Patient also has a history of ovarian cancer and cervical cancer (S/P TAH and BSO 1974) as well as Hodgkin's lymphoma status post chemo and mantle radiation in 1987, moderate aortic stenosis (Echo 07/2021), history of jejunostomy tube placement for severe dysphagia complicated by development of small bowel fistula status post removal of the jejunostomy tube in early 2023, hypothyroidism, non-insulin-dependent diabetes mellitus type 2, bilateral PEs 02/2019 (no longer on anticoagulation), stroke 02/2019, gastroesophageal reflux disease, hypertension, hyperlipidemia presented to ED on 09/22/2021 from home with severe intractable pain and shortness of breath. Patient is enrolled in hospice care, symptoms were not able to be managed well at home. Patient was admitted on 10/05/2021 for comfort care.   Subjective: Chart reviewed including labs progress notes and medication usage. She is requiring frequent boluses of her as needed hydromorphone.  Noted attending started scheduled Ativan.  Family at bedside.  Discussed with daughter that oxygen via nasal cannula can prolong the dying process.  Recommend reducing oxygen and utilizing as needed opioids and benzos for any signs of air hunger or discomfort - she is receiving at 3  L. Daughter declined at this time-she would like her symptoms to be better managed and is hopeful that with scheduled Ativan this will help.  She is also interested in obtaining vital signs.    Review of Systems  Unable to perform ROS: Acuity of condition     Physical Exam Vitals and nursing note reviewed.  Pulmonary:     Effort: Pulmonary effort is normal.     Comments: Even and unlabored Neurological:     Comments: unresponsive             Vital Signs: BP 104/69 (BP Location: Right Arm)   Pulse (!) 122   Temp 98.8 F (37.1 C) (Oral)   Resp 20   SpO2 99%  SpO2: SpO2:  99 % O2 Device: O2 Device: Nasal Cannula O2 Flow Rate: O2 Flow Rate (L/min): 4 L/min  Intake/output summary:  Intake/Output Summary (Last 24 hours) at 09/17/2021 1534 Last data filed at 09/17/2021 1303 Gross per 24 hour  Intake 81.15 ml  Output --  Net 81.15 ml    LBM:   Baseline Weight:   Most recent weight:         Palliative Assessment/Data: PPS: 10%      Patient Active Problem List   Diagnosis Date Noted   Intractable pain 09/28/2021   Gastro-esophageal reflux disease without esophagitis 10/05/2021   Type 2 diabetes mellitus without complications ([place]) 16/10/9602   Pleural effusion, bilateral 09/20/2021   Acute respiratory failure with hypoxia ([place]) 09/17/2021   NSTEMI (non-ST elevated myocardial infarction) ([place]) 09/21/2021   Widespread metastatic malignant neoplastic disease ([place]) 09/23/2021   Small bowel fistula 54/09/8117   Acute diastolic heart failure ([place]) 09/17/2021   Malignant ascites 08/31/2021   Essential hypertension 06/17/2020   Megaloblastic anemia due to vitamin B12 deficiency 06/17/2020   C. difficile diarrhea 02/27/2019   CVA (cerebrovascular accident) ([place]) 02/27/2019   Cerebrovascular accident (CVA) ([place])    HLD (hyperlipidemia) 02/21/2019   Controlled type 2 diabetes mellitus with hyperglycemia ([place]) 02/21/2019   Hypothyroidism 02/21/2019   Acute pulmonary  embolism ([place]) 02/21/2019   COVID-19 virus infection 02/21/2019   PE (physical exam), annual 02/20/2019   Acute pulmonary embolism without acute cor pulmonale ([place]) 02/20/2019    Palliative Care Assessment & Plan    Assessment/Recommendations/Plan  Patient is nearing end-of-life Continue current comfort interventions Increase hydromorphone infusion rate to 1 mg/h - [name] is in agreement with this Continue scheduled and as needed lorazepam as ordered Recommend discontinuing oxygen via nasal cannula - will continue to discuss this with daughter   Code Status: DNR  Prognosis:  Hours - Days  Discharge Planning: Anticipated Hospital Death  Care plan was discussed with patient's daughter  Thank you for allowing the Palliative Medicine Team to assist in the care of this patient.  Greater than 50%  of this time was spent counseling and coordinating care related to the above assessment and plan.  Mariana Kaufman, AGNP-C Palliative Medicine   Please contact Palliative Medicine Team phone at [phone] for questions and concerns.

## 2021-09-17 NOTE — Progress Notes (Signed)
Progress Note   Patient: Jeanne Sims SHF:[id number] DOB: Dec 10, 1955 DOA: 09/23/2021     5 DOS: the patient was seen and examined on 09/17/2021   Brief hospital course: Jeanne Sims was admitted to the hospital with the working diagnosis of intractable pain in the setting of metastatic esophageal cancer.   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 History of ovarian and cervical cancer. Hodgkin's lymphoma. 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  Patient had a recent hospitalization at [facility] from 8/25 to 09.01 for severe abdominal pain related to advance malignancy. She was discharged home with hospice services.   At home patient with dyspnea, nausea, vomiting and severe abdominal/chest pain, EMS was called and patient was transported to the hospital.  On her initial physical examination her blood pressure was 150/82, HR 123, RR 22 and 02 saturation 94% on room air. Patient was lethargic but easy to arouse, dry mucous membranes, heart with S1 and S2 present and tachycardic, lungs with decreased breath sounds and rales at bases, abdomen diffusely tender, with no distention, ostomy bag in place. Trace lower extremity edema.    NA 131, K 4,3 CL 98, bicarbonate 21, glucose 347, bun 17 cr 0,83 AlK P 636  AST 50 ALT 32 BNP 1,625 High sensitive troponin 1,781, and 2,181  Wbc 19,1 hgb 8,6 plt 116  Sars covid 19 negative Urine analysis SG 1,030, protein 30, 0-5 wbc   Chest radiograph with mild cardiomegaly, bilateral interstitial infiltrates more at the lower lobes, cephalization of the vasculature and bilateral pleural effusion.  CT chest with bilateral ground glass opacities, and bilateral pleural effusions, right upper lobe infiltrate.  No pulmonary embolism. Wide spread sclerotic metastasis throughout the visualized  axial skeleton of the abdomen, and pelvis. Mild ascites. Peritoneal carcinomatosis.  Pathologic fractures T5, T8-T11 and L1 and L2.   EKG 126 bpm, normal axis, normal intervals, sinus rhythm with no significant ST segment or T wave changes.   Patient with poor prognosis, due to advance malignancy. She was placed on comfort measures for symptomatic medical therapy.  Palliative care has been consulted. Patient with intermittent agitation.  09/10 continue comfort measures.  09/11 patient has been transitioned to continuous infusion of hydromorphone, her mentation not optimal for PCA pump.   Assessment and Plan: * Widespread metastatic malignant neoplastic disease ([place]) Gastric/GE Junction adenocarcinoma  Evidence of extensive osseus spread, malignant effusions and peritoneal carcinomatosis Prognosis extremely poor Comfort measures in accordance with patient/family wishes, as noted above.  Acute metabolic encephalopathy and delirium.  Agitation has improved.   Continue pain control with hydromorphone infusion 1 mg per hr Add lorazepam 1 mg tid to prevent agitation.  Continue with as needed hydromorphone boluses, lorazepam and haloperidol.  Patient has fentanyl patch in place.  Her family is at the bedside.   Acute respiratory failure with hypoxia (HCC) Hypoxic on arrival Likely due to progressive malignant effusions EDP ordered lasix with limited to no response Very low rate O2 for now which would should be weaned off today comfort measures as above  Intractable pain Patient suffering from severe diffuse abdominal pain with intractable nausea and vomiting as a result of advanced progressing metastatic disease Prognosis extremely poor Patient/Family unable to achieve symptomatic relief at home.  Requesting hospitalization to further pursue comfort based measures Daughter and husband are at the bedside, are engaged and are all  in agreement with the patient's desire to focus on  comfort.  Providing patient with opiate based analgesics, anxiolytics, antiemetics and bronchodilator therapy for significant symptoms Palliative care consultation placed  Acute diastolic heart failure ([place]) Patient with pulmonary edema, bilateral pleural effusions, cardiogenic. Complicated with acute respiratory failure.  Plan to continue diuresis for symptomatic control Continue with furosemide to 60 mg IV q12 hrs prn for signs of respiratory distress.   Echocardiogram from 2021 with preserved LV systolic function Considering patient's prognosis will hold on further cardiac workup.   NSTEMI (non-ST elevated myocardial infarction) [facility]) Patient with very poor prognosis.  Peritoneal carcinomatosis and risk of bleeding is high Continue to hold on heparin  Change furosemide to as needed for respiratory distress.  Patient with end stage cancer with metastasis. Poor prognosis, not candidate for further cardiac workup.    Essential hypertension Furosemide for palliative de-congestion.   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    Type 2 diabetes mellitus without complications ([place]) Patient with hyperglycemia on admission No further insulin therapy, patient on comfort measures.         Subjective: Patient with episodic agitation, her family is at the bedside   Physical Exam: Vitals:   09/14/21 1203 09/14/21 1622 09/15/21 0515 09/17/21 1453  BP:   (!) 130/54 104/69  Pulse:   (!) 134 (!) 122  Resp: 16 18 20    Temp:    98.8 F (37.1 C)  TempSrc:    Oral  SpO2:  (!) 85% 91% 99%   Patient very deconditioned and ill looking appearing   Data Reviewed:    Family Communication: I spoke with patient's sister and husband at the bedside, we talked in detail about patient's condition, plan of care and prognosis and all questions were addressed.   Disposition: Status is: Inpatient Remains inpatient appropriate because: Palliative care with IV medications   Planned Discharge Destination:  imminent hospital death     Author: Tawni Millers, MD 09/17/2021 3:34 PM  For on call review [email or web address].

## 2021-10-06 NOTE — Discharge Summary (Signed)
  DEATH SUMMARY   Patient Details  Name: Jeanne Sims MRN: [id number] DOB: 02-20-1955  Admission/Discharge Information   Admit Date:  2021-10-05  Date of Death: Date of Death: 11-Oct-2021  Time of Death: Time of Death: 0720  Length of Stay: 6  Code Status:   Referring Physician: Bonnita Nasuti, MD    Reason(s) for Hospitalization  66/F with history of gastroesophageal adenocarcinoma with mets to thoracic spine, sacrum, ribs, peritoneal carcinomatosis, history of ovarian and cervical cancer, Hodgkin's lymphoma, small bowel fistulas, PE and CVA, recent hospitalization at [facility] for similar problems was discharged home with hospice services presented to the ED with dyspnea nausea vomiting, increased abdominal/chest pain  Diagnoses  Preliminary cause of death:  Secondary Diagnoses (including complications and co-morbidities):  Principal Problem:   Widespread metastatic malignant neoplastic disease ([place]) Active Problems:   Acute diastolic heart failure (HCC)   NSTEMI (non-ST elevated myocardial infarction) ([place])   Essential hypertension   Small bowel fistula   Hypothyroidism   Type 2 diabetes mellitus without complications [facility])    [name] is a 66/F with history of gastroesophageal adenocarcinoma with mets to thoracic spine, sacrum, ribs, and effusions, peritoneal carcinomatosis, history of ovarian and cervical cancer, Hodgkin's lymphoma, small bowel fistulas, PE and CVA, recent hospitalization at [facility] for similar problems was discharged home with hospice services presented to  [facility] ED with dyspnea nausea vomiting, increased abdominal/chest pain. -Noted to have extensive bony mets with malignant pleural effusions, peritoneal carcinomatosis -Also had NSTEMI, acute hypoxic respiratory failure from malignant effusions, limited response with diuretics, also complicated by metabolic encephalopathy, delirium and agitation.  Prior to this admission she had been  discharged home from [facility] few weeks ago with home hospice.  Poor prognosis has been discussed with family by my partner Dr. Cathlean Sauer -Seen by palliative care, had family meeting, decision was made for comfort care, she was started on Dilaudid gtt. with as needed lorazepam.  Expired on 10/12/2022 at 7:20 AM before I ever had a chance to see her.    Domenic Polite 09/19/2021, 12:04 PM

## 2021-10-06 NOTE — Progress Notes (Signed)
This chaplain is present after the Pt. death, providing spiritual care with the family as part of the PMT.    The Pt. daughter, husband, sisters, granddaughter, and great granddaughter are at the bedside. The chaplain listened reflectively to the family memories and participated in the sharing of pictures.  The chaplain understands strength and perseverance is part of the Pt. legacy. The visit ended with a Pt. blessing.  This chaplain is available for F/U spiritual care as needed.  Chaplain Sallyanne Kuster [phone]

## 2021-10-06 NOTE — Progress Notes (Signed)
   October 03, 2021 0750  Clinical Encounter Type  Visited With Family  Visit Type Initial;Death  Referral From Nurse  Consult/Referral To Chaplain   Chaplain responded to a call for support of the family. Two family members were in the room. Both were quietly grieving Minnie's death. As her daughter commented "she is finally at peace."  They said that more family was coming and they may need a chaplain at that time. I shared that a chaplain would respond if they wanted one.   Danice Goltz  [facility]  [phone]

## 2021-10-06 NOTE — Progress Notes (Signed)
Pt expired at 58. Daria Pastures RN and Arlice Colt RN verified death. Dr. Broadus John, chaplain and [facility] notified(referral [phone]) spoke with [place]. Husband and daughter at bedside.

## 2021-10-06 NOTE — Plan of Care (Signed)
  Problem: Education: Goal: Knowledge of the prescribed therapeutic regimen will improve Outcome: Not Progressing   Problem: Coping: Goal: Ability to identify and develop effective coping behavior will improve Outcome: Not Progressing   Problem: Clinical Measurements: Goal: Quality of life will improve Outcome: Not Progressing   Problem: Role Relationship: Goal: Family's ability to cope with current situation will improve Outcome: Progressing

## 2021-10-06 NOTE — Progress Notes (Signed)
Wasted 66m of dilaudid with PDaria PasturesRN in medication waste container.

## 2021-10-06 DEATH — deceased

## 2022-02-14 IMAGING — CT CT HEAD W/O CM
3 of 4 series · 16 of 47 positions shown, 19 images · non-contrast
Comparison: Two days ago

CLINICAL DATA: Stroke follow-up. Right-sided weakness and decreased
sensation.

EXAM:
CT HEAD WITHOUT CONTRAST
TECHNIQUE: Contiguous axial images were obtained from the base of the skull
through the vertex without intravenous contrast.

[Series 3: head 5.0 (person_name)30(person_name) (person_name · axial · 0.43mm/px · z∈[-88,+47]mm · 10 of 33 slices shown, 13 images]
[im 3/33  brain]
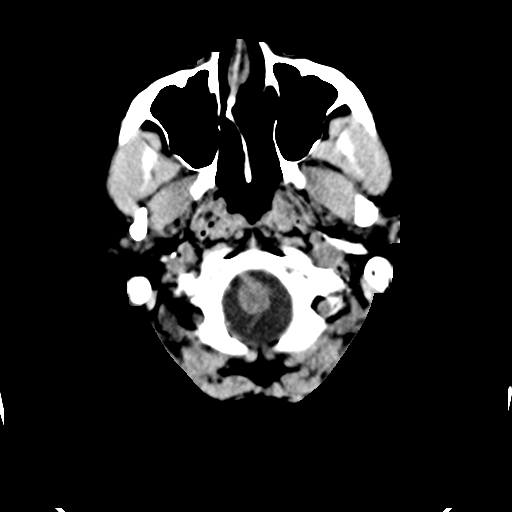
[im 3/33  bone]
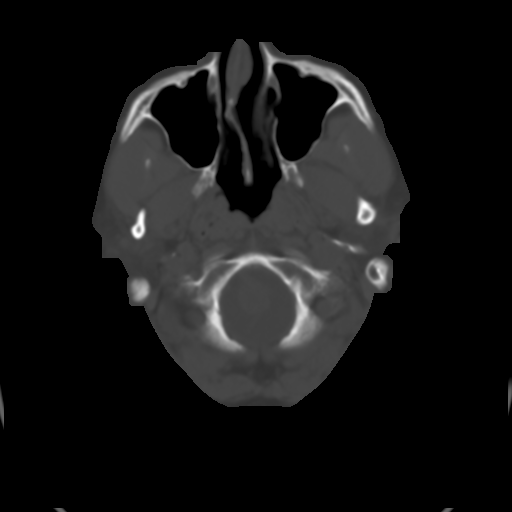
[im 5/33  brain]
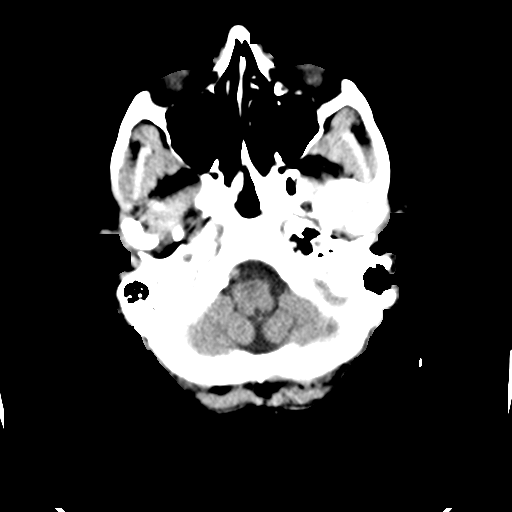
[im 10/33  brain]
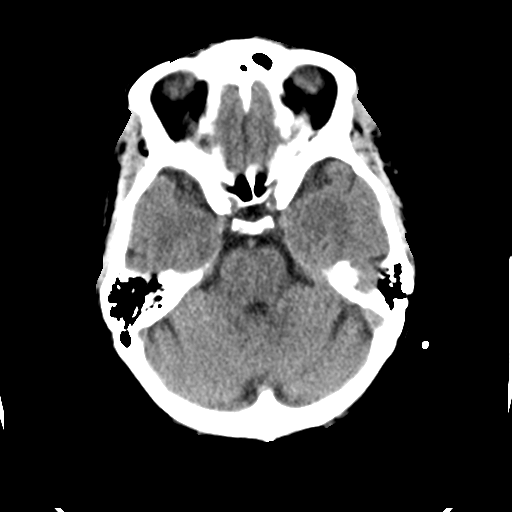
[im 12/33  brain]
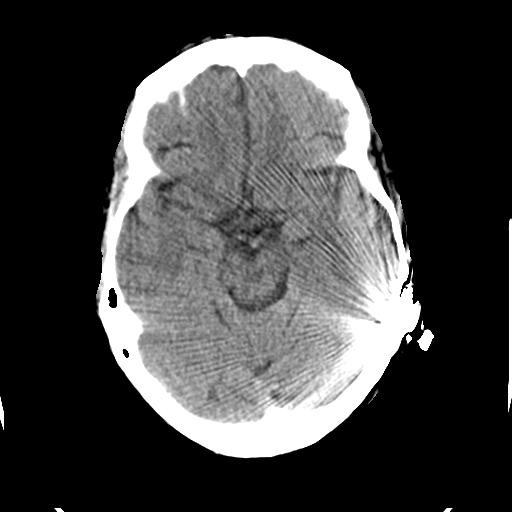
[im 14/33  brain]
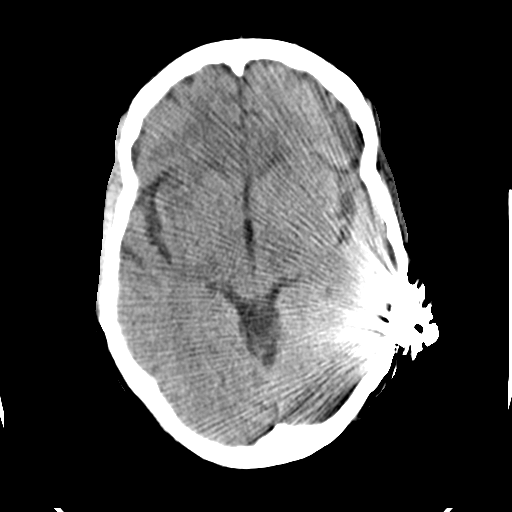
[im 14/33  bone]
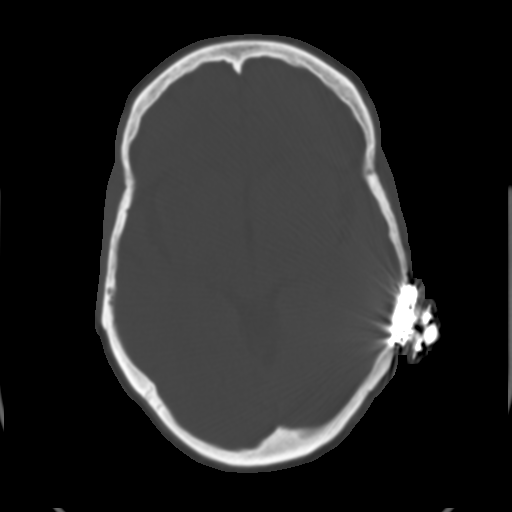
[im 19/33  brain]
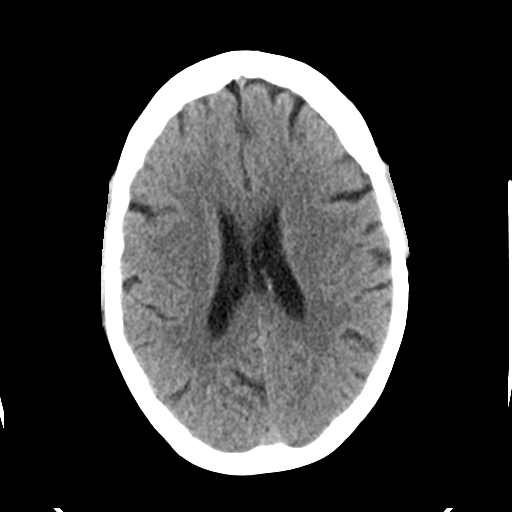
[im 21/33  brain]
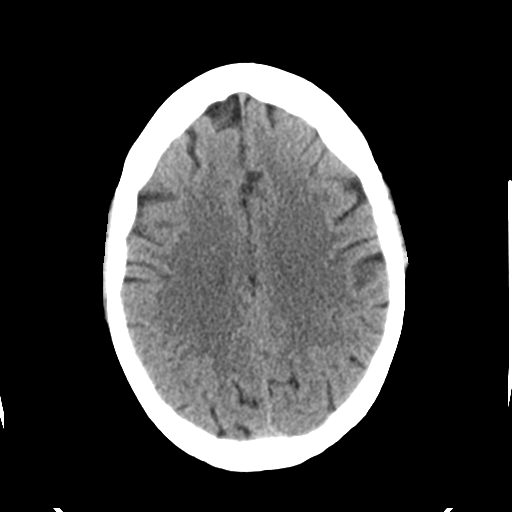
[im 23/33  brain]
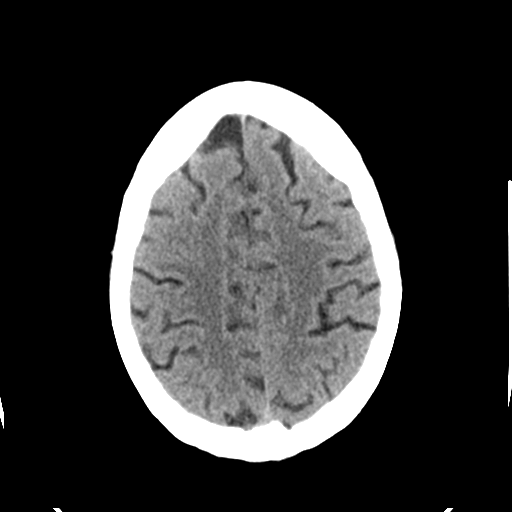
[im 28/33  brain]
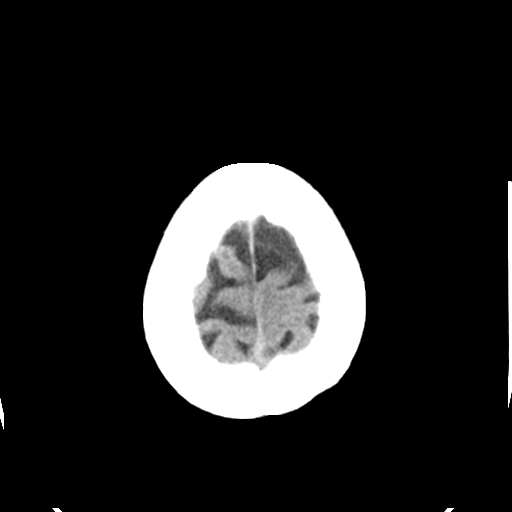
[im 28/33  bone]
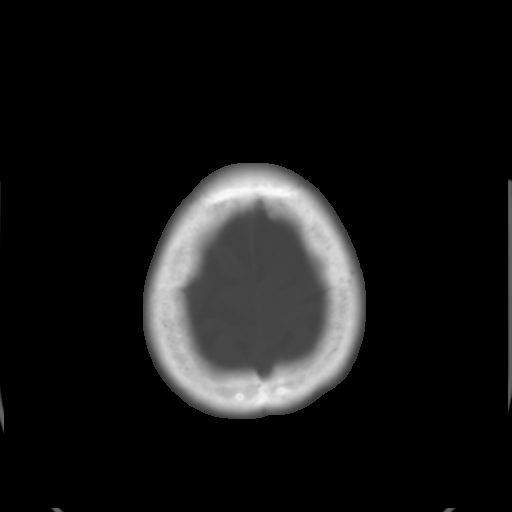
[im 30/33  brain]
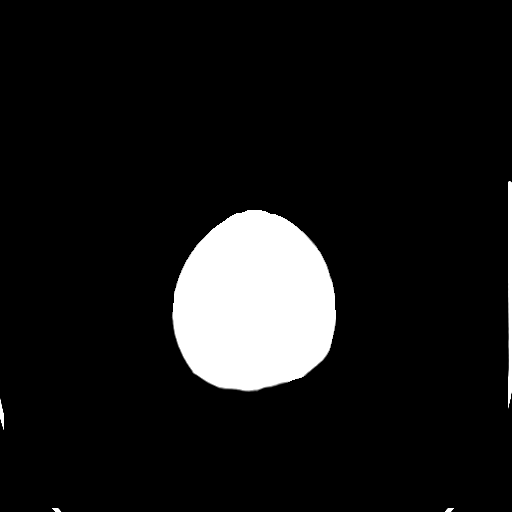

[Series 5: head 3.0 mpr cor · coronal · 0.32mm/px · 3 of 67 slices shown]
[im 23/67  brain]
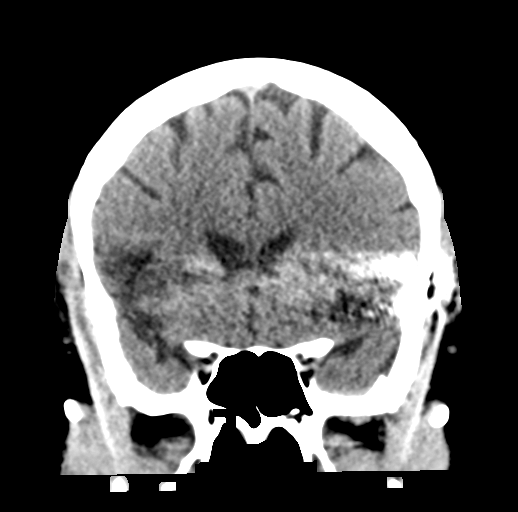
[im 30/67  brain]
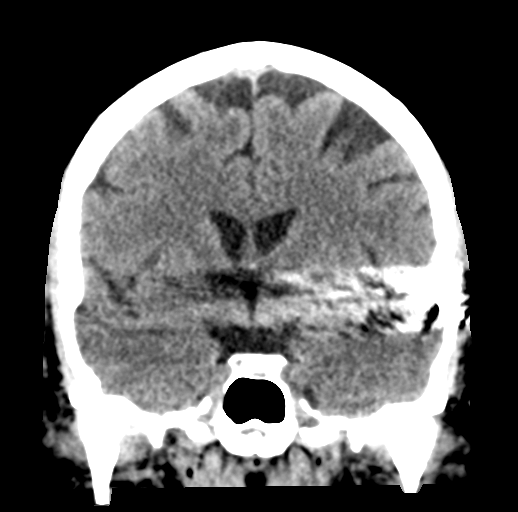
[im 37/67  brain]
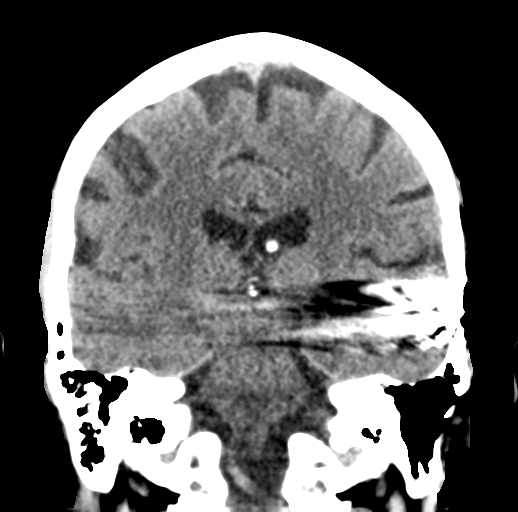

[Series 6: head 3.0 mpr sag · sagittal · 0.32mm/px · 3 of 53 slices shown]
[im 18/53  brain]
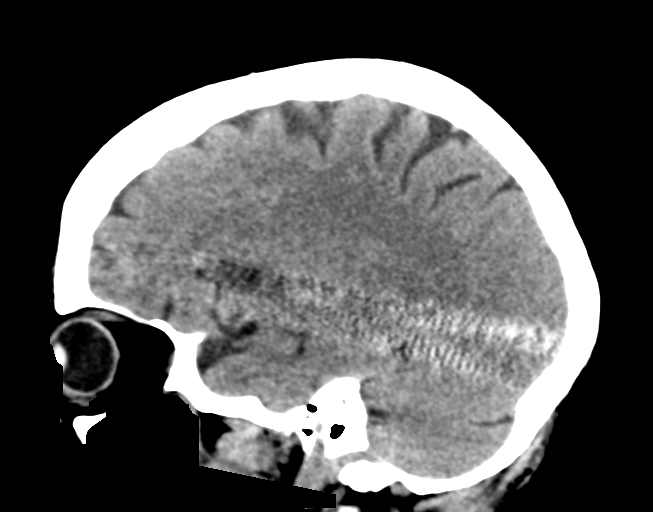
[im 27/53  brain]
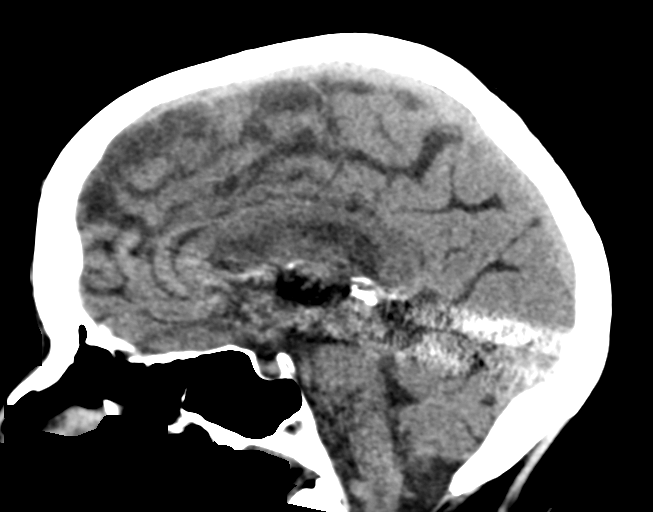
[im 35/53  brain]
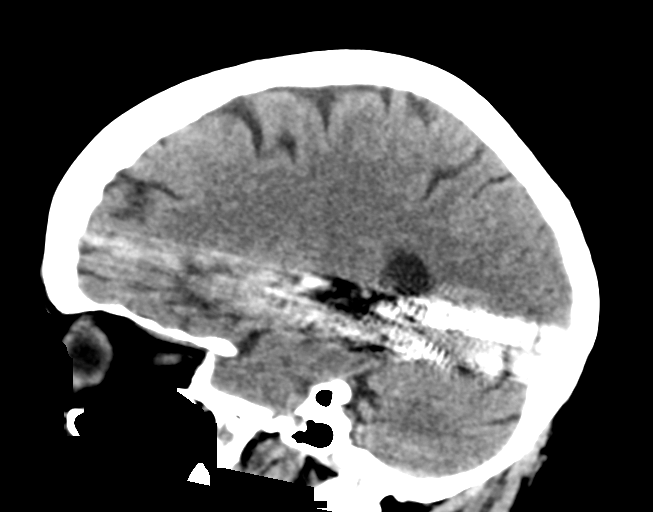

[16 of 47 positions shown; findings below may reference images not displayed]

FINDINGS: Brain: There is indistinct anterior right insula, likely artifact
from the patient's cochlear implant as this does not correlate with
the reported symptoms. No identified infarct, hemorrhage,
hydrocephalus, or masslike finding.

Vascular: No hyperdense vessel.

Skull: Negative

Sinuses/Orbits: Unremarkable left cochlear implant.
IMPRESSION: Stable head CT.  No visible infarct.
